# Patient Record
Sex: Male | Born: 1960
Health system: Southern US, Community
[De-identification: ages and names within clinical notes are randomized; demographics above are authoritative.]

## PROBLEM LIST (undated history)

## (undated) ENCOUNTER — Emergency Department (HOSPITAL_COMMUNITY): Admission: EM | Discharge status: Absent without leave | Payer: Medicare HMO

## (undated) DIAGNOSIS — R7303 Prediabetes: Secondary | ICD-10-CM

## (undated) DIAGNOSIS — IMO0002 Reserved for concepts with insufficient information to code with codable children: Secondary | ICD-10-CM

## (undated) DIAGNOSIS — M549 Dorsalgia, unspecified: Secondary | ICD-10-CM

## (undated) DIAGNOSIS — J439 Emphysema, unspecified: Secondary | ICD-10-CM

## (undated) DIAGNOSIS — N400 Enlarged prostate without lower urinary tract symptoms: Secondary | ICD-10-CM

## (undated) DIAGNOSIS — M1990 Unspecified osteoarthritis, unspecified site: Secondary | ICD-10-CM

## (undated) DIAGNOSIS — J449 Chronic obstructive pulmonary disease, unspecified: Secondary | ICD-10-CM

## (undated) DIAGNOSIS — J45909 Unspecified asthma, uncomplicated: Secondary | ICD-10-CM

## (undated) DIAGNOSIS — F419 Anxiety disorder, unspecified: Secondary | ICD-10-CM

## (undated) HISTORY — PX: EYE SURGERY: SHX253

## (undated) HISTORY — PX: BACK SURGERY: SHX140

---

## 2000-09-25 ENCOUNTER — Emergency Department (HOSPITAL_COMMUNITY): Admission: EM | Admit: 2000-09-25 | Discharge: 2000-09-25 | Payer: Self-pay

## 2001-01-17 ENCOUNTER — Ambulatory Visit (HOSPITAL_COMMUNITY): Admission: RE | Admit: 2001-01-17 | Discharge: 2001-01-17 | Payer: Self-pay | Admitting: Neurosurgery

## 2002-02-19 ENCOUNTER — Encounter: Payer: Self-pay | Admitting: Emergency Medicine

## 2002-02-19 ENCOUNTER — Emergency Department (HOSPITAL_COMMUNITY): Admission: EM | Admit: 2002-02-19 | Discharge: 2002-02-19 | Payer: Self-pay | Admitting: *Deleted

## 2003-05-15 ENCOUNTER — Ambulatory Visit (HOSPITAL_COMMUNITY): Admission: RE | Admit: 2003-05-15 | Discharge: 2003-05-15 | Payer: Self-pay | Admitting: Family Medicine

## 2003-05-15 ENCOUNTER — Encounter: Payer: Self-pay | Admitting: Family Medicine

## 2003-05-24 ENCOUNTER — Emergency Department (HOSPITAL_COMMUNITY): Admission: EM | Admit: 2003-05-24 | Discharge: 2003-05-25 | Payer: Self-pay | Admitting: *Deleted

## 2003-12-26 ENCOUNTER — Ambulatory Visit (HOSPITAL_COMMUNITY): Admission: RE | Admit: 2003-12-26 | Discharge: 2003-12-26 | Payer: Self-pay | Admitting: Internal Medicine

## 2004-03-29 ENCOUNTER — Emergency Department (HOSPITAL_COMMUNITY): Admission: EM | Admit: 2004-03-29 | Discharge: 2004-03-29 | Payer: Self-pay | Admitting: *Deleted

## 2004-04-02 ENCOUNTER — Inpatient Hospital Stay (HOSPITAL_COMMUNITY): Admission: RE | Admit: 2004-04-02 | Discharge: 2004-04-02 | Payer: Self-pay | Admitting: Oral & Maxillofacial Surgery

## 2007-04-30 ENCOUNTER — Ambulatory Visit (HOSPITAL_COMMUNITY): Admission: RE | Admit: 2007-04-30 | Discharge: 2007-04-30 | Payer: Self-pay | Admitting: Family Medicine

## 2008-03-15 ENCOUNTER — Ambulatory Visit (HOSPITAL_COMMUNITY): Admission: RE | Admit: 2008-03-15 | Discharge: 2008-03-15 | Payer: Self-pay | Admitting: Internal Medicine

## 2009-01-25 ENCOUNTER — Ambulatory Visit (HOSPITAL_COMMUNITY): Admission: RE | Admit: 2009-01-25 | Discharge: 2009-01-25 | Payer: Self-pay | Admitting: Internal Medicine

## 2010-11-10 ENCOUNTER — Encounter: Payer: Self-pay | Admitting: Family Medicine

## 2011-01-30 ENCOUNTER — Other Ambulatory Visit (HOSPITAL_COMMUNITY): Payer: Self-pay | Admitting: Internal Medicine

## 2011-01-30 DIAGNOSIS — M549 Dorsalgia, unspecified: Secondary | ICD-10-CM

## 2011-01-30 DIAGNOSIS — G8929 Other chronic pain: Secondary | ICD-10-CM

## 2011-01-31 ENCOUNTER — Ambulatory Visit (HOSPITAL_COMMUNITY)
Admission: RE | Admit: 2011-01-31 | Discharge: 2011-01-31 | Disposition: A | Discharge status: Home / Self Care | Payer: Medicare Other | Source: Ambulatory Visit | Attending: Internal Medicine | Admitting: Internal Medicine

## 2011-01-31 ENCOUNTER — Other Ambulatory Visit (HOSPITAL_COMMUNITY): Payer: Self-pay | Admitting: Internal Medicine

## 2011-01-31 DIAGNOSIS — M549 Dorsalgia, unspecified: Secondary | ICD-10-CM

## 2011-01-31 DIAGNOSIS — N433 Hydrocele, unspecified: Secondary | ICD-10-CM | POA: Insufficient documentation

## 2011-01-31 DIAGNOSIS — G8929 Other chronic pain: Secondary | ICD-10-CM

## 2011-01-31 DIAGNOSIS — N509 Disorder of male genital organs, unspecified: Secondary | ICD-10-CM | POA: Insufficient documentation
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
## 2012-08-19 ENCOUNTER — Other Ambulatory Visit (HOSPITAL_COMMUNITY): Payer: Self-pay | Admitting: Physical Medicine and Rehabilitation

## 2012-08-19 DIAGNOSIS — M961 Postlaminectomy syndrome, not elsewhere classified: Secondary | ICD-10-CM

## 2012-08-19 DIAGNOSIS — M545 Low back pain: Secondary | ICD-10-CM

## 2012-08-19 DIAGNOSIS — IMO0002 Reserved for concepts with insufficient information to code with codable children: Secondary | ICD-10-CM

## 2012-08-19 DIAGNOSIS — G894 Chronic pain syndrome: Secondary | ICD-10-CM

## 2012-08-20 ENCOUNTER — Ambulatory Visit (HOSPITAL_COMMUNITY)
Admission: RE | Admit: 2012-08-20 | Discharge: 2012-08-20 | Disposition: A | Discharge status: Home / Self Care | Payer: Medicare Other | Source: Ambulatory Visit | Attending: Physical Medicine and Rehabilitation | Admitting: Physical Medicine and Rehabilitation

## 2012-08-20 DIAGNOSIS — M545 Low back pain, unspecified: Secondary | ICD-10-CM | POA: Insufficient documentation

## 2012-08-20 DIAGNOSIS — IMO0002 Reserved for concepts with insufficient information to code with codable children: Secondary | ICD-10-CM

## 2012-08-20 DIAGNOSIS — M961 Postlaminectomy syndrome, not elsewhere classified: Secondary | ICD-10-CM

## 2012-08-20 DIAGNOSIS — M48061 Spinal stenosis, lumbar region without neurogenic claudication: Secondary | ICD-10-CM | POA: Insufficient documentation

## 2012-08-20 DIAGNOSIS — G894 Chronic pain syndrome: Secondary | ICD-10-CM

## 2012-08-20 DIAGNOSIS — G8929 Other chronic pain: Secondary | ICD-10-CM | POA: Insufficient documentation

## 2012-08-20 MED ORDER — GADOBENATE DIMEGLUMINE 529 MG/ML IV SOLN: 15.0000 mL | Freq: Once | INTRAVENOUS | Status: AC | PRN

## 2013-05-29 ENCOUNTER — Emergency Department (HOSPITAL_COMMUNITY)
Admission: EM | Admit: 2013-05-29 | Discharge: 2013-05-29 | Disposition: A | Discharge status: Home / Self Care | Payer: Medicare Other | Attending: Emergency Medicine | Admitting: Emergency Medicine

## 2013-05-29 ENCOUNTER — Encounter (HOSPITAL_COMMUNITY): Payer: Self-pay | Admitting: Emergency Medicine

## 2013-05-29 DIAGNOSIS — M25461 Effusion, right knee: Secondary | ICD-10-CM

## 2013-05-29 DIAGNOSIS — M25551 Pain in right hip: Secondary | ICD-10-CM

## 2013-05-29 DIAGNOSIS — M25559 Pain in unspecified hip: Secondary | ICD-10-CM | POA: Insufficient documentation

## 2013-05-29 DIAGNOSIS — M549 Dorsalgia, unspecified: Secondary | ICD-10-CM | POA: Insufficient documentation

## 2013-05-29 DIAGNOSIS — G8929 Other chronic pain: Secondary | ICD-10-CM | POA: Insufficient documentation

## 2013-05-29 DIAGNOSIS — Z791 Long term (current) use of non-steroidal anti-inflammatories (NSAID): Secondary | ICD-10-CM | POA: Insufficient documentation

## 2013-05-29 DIAGNOSIS — M25469 Effusion, unspecified knee: Secondary | ICD-10-CM | POA: Insufficient documentation

## 2013-05-29 DIAGNOSIS — Z79899 Other long term (current) drug therapy: Secondary | ICD-10-CM | POA: Insufficient documentation

## 2013-05-29 MED ORDER — ONDANSETRON HCL 4 MG PO TABS
4.0000 mg | ORAL_TABLET | Freq: Once | ORAL | Status: AC
  Administered 2013-05-29: 4 mg via ORAL
  Filled 2013-05-29: qty 1

## 2013-05-29 MED ORDER — KETOROLAC TROMETHAMINE 60 MG/2ML IM SOLN
60.0000 mg | Freq: Once | INTRAMUSCULAR | Status: AC
  Administered 2013-05-29: 60 mg via INTRAMUSCULAR
  Filled 2013-05-29: qty 2

## 2013-05-29 MED ORDER — MORPHINE SULFATE 4 MG/ML IJ SOLN
8.0000 mg | Freq: Once | INTRAMUSCULAR | Status: AC
  Administered 2013-05-29: 8 mg via INTRAMUSCULAR
  Filled 2013-05-29: qty 2

## 2013-05-29 NOTE — ED Provider Notes (Signed)
CSN: [id number]     Arrival date & time 05/29/13  2008 History     First MD Initiated Contact with Patient 05/29/13 2046     Chief Complaint  Patient presents with  . Knee Pain   (Consider location/radiation/quality/duration/timing/severity/associated sxs/prior Treatment) Patient is a 52 y.o. male presenting with knee pain. The history is provided by the patient.  Knee Pain Location:  Knee Time since incident:  2 days (Pt has chronic knee and hip pain, worse for the past 2 days) Injury: no   Knee location:  R knee Pain details:    Quality:  Aching   Radiates to: right hip.   Severity:  Severe   Onset quality:  Gradual   Timing:  Intermittent   Progression:  Worsening Chronicity:  Chronic Dislocation: no   Foreign body present:  No foreign bodies Prior injury to area:  No Relieved by:  Nothing Worsened by:  Bearing weight Ineffective treatments: narcotic pain medications. Associated symptoms: back pain and decreased ROM   Associated symptoms: no fever and no neck pain     History reviewed. No pertinent past medical history. History reviewed. No pertinent past surgical history. History reviewed. No pertinent family history. History  Substance Use Topics  . Smoking status: Not on file  . Smokeless tobacco: Not on file  . Alcohol Use: Not on file    Review of Systems  Constitutional: Negative for fever and activity change.       All ROS Neg except as noted in HPI  HENT: Negative for nosebleeds and neck pain.   Eyes: Negative for photophobia and discharge.  Respiratory: Negative for cough, shortness of breath and wheezing.   Cardiovascular: Negative for chest pain and palpitations.  Gastrointestinal: Negative for abdominal pain and blood in stool.  Genitourinary: Negative for dysuria, frequency and hematuria.  Musculoskeletal: Positive for back pain and arthralgias.  Skin: Negative.   Neurological: Negative for dizziness, seizures and speech difficulty.   Psychiatric/Behavioral: Negative for hallucinations and confusion.    Allergies  Review of patient's allergies indicates no known allergies.  Home Medications   Current Outpatient Rx  Name  Route  Sig  Dispense  Refill  . carisoprodol (SOMA) 350 MG tablet   Oral   Take 350 mg by mouth 4 (four) times daily as needed for muscle spasms.         . celecoxib (CELEBREX) 200 MG capsule   Oral   Take 200 mg by mouth daily.         . diazepam (VALIUM) 10 MG tablet   Oral   Take 10 mg by mouth 4 (four) times daily as needed for anxiety.         [name] doxepin (SINEQUAN) 100 MG capsule   Oral   Take 100-200 mg by mouth at bedtime as needed. FOR SLEEP         . HYDROcodone-acetaminophen (NORCO) 10-325 MG per tablet   Oral   Take 1 tablet by mouth every 6 (six) hours as needed for pain.         . pregabalin (LYRICA) 200 MG capsule   Oral   Take 200 mg by mouth 3 (three) times daily.          BP 120/83  Pulse 87  Temp(Src) 97.8 F (36.6 C) (Oral)  Resp 18  SpO2 97% Physical Exam  Nursing note and vitals reviewed. Constitutional: He is oriented to person, place, and time. He appears well-developed and well-nourished.  Non-toxic appearance.  HENT:  Head: Normocephalic.  Right Ear: Tympanic membrane and external ear normal.  Left Ear: Tympanic membrane and external ear normal.  Eyes: EOM and lids are normal. Pupils are equal, round, and reactive to light.  Neck: Normal range of motion. Neck supple. Carotid bruit is not present.  Cardiovascular: Normal rate, regular rhythm, normal heart sounds, intact distal pulses and normal pulses.   Pulmonary/Chest: Breath sounds normal. No respiratory distress.  Abdominal: Soft. Bowel sounds are normal. There is no tenderness. There is no guarding.  Musculoskeletal: Normal range of motion.  There is crepitus of the right and left knee, right more than left. There is an effusion of the right knee. Is no posterior mass appreciated. The  Achilles tendon is intact on the right. The dorsalis pedis and posterior tibial pulses are symmetrical. There is pain and soreness to attempted range of motion of the right hip.  Lymphadenopathy:       Head (right side): No submandibular adenopathy present.       Head (left side): No submandibular adenopathy present.    He has no cervical adenopathy.  Neurological: He is alert and oriented to person, place, and time. He has normal strength. No cranial nerve deficit or sensory deficit.  Skin: Skin is warm and dry.  Psychiatric: He has a normal mood and affect. His speech is normal.    ED Course   Procedures (including critical care time)  Labs Reviewed - No data to display No results found. No diagnosis found.  MDM  **I have reviewed nursing notes, vital signs, and all appropriate lab and imaging results for this patient.* Patient states he has" arthritis all over his body" . He denies any recent fall, accident, or injury involving the right knee. He states that he has severe back problems. He is recently been noticing increasing pain of the right hip, pain and swelling of the right knee.  The patient has crepitus of the right and left knee. Right more than left. There is an effusion noted of the right knee. There is pain with attempted range of motion of the right hip.  The patient is given an injection of morphine and an injection of Toradol here in the emergency department. The patient is currently being treated with multiple medications for her discomfort, and he is advised to see his primary physician or pain physician for additional assistance with this problem. Patient is referred to orthopedics for additional evaluation including possible aspiration of the right knee. Crutches were offered to the patient, however he states he has a cane and uses it from time to time.  Kathie Dike, PA-C 05/29/13 2126

## 2013-05-29 NOTE — ED Notes (Signed)
Pt alert & oriented x4, stable gait. Patient given discharge instructions, paperwork & prescription(s). Patient  instructed to stop at the registration desk to finish any additional paperwork. Patient verbalized understanding. Pt left department w/ no further questions. 

## 2013-05-29 NOTE — ED Notes (Signed)
Pt c/o right knee pain and swelling since Friday evening. Pt states pain radiates to his hip.

## 2013-05-29 NOTE — ED Notes (Signed)
Pt reports right hip pain & right knee swelling. Pt denies any injury. Pain started on Friday.

## 2013-06-03 NOTE — ED Provider Notes (Signed)
History/physical exam/procedure(s) were performed by non-physician practitioner and as supervising physician I was immediately available for consultation/collaboration. I have reviewed all notes and am in agreement with care and plan.   Kahle Mcqueen S Tranae Laramie, MD 06/03/13 1244 

## 2013-06-07 ENCOUNTER — Ambulatory Visit (INDEPENDENT_AMBULATORY_CARE_PROVIDER_SITE_OTHER): Payer: Medicare Other

## 2013-06-07 ENCOUNTER — Ambulatory Visit (INDEPENDENT_AMBULATORY_CARE_PROVIDER_SITE_OTHER): Payer: Medicare Other | Admitting: Orthopedic Surgery

## 2013-06-07 VITALS — BP 120/76 | Ht 73.0 in | Wt 177.0 lb

## 2013-06-07 DIAGNOSIS — M25561 Pain in right knee: Secondary | ICD-10-CM

## 2013-06-07 DIAGNOSIS — M25569 Pain in unspecified knee: Secondary | ICD-10-CM

## 2013-06-07 NOTE — Patient Instructions (Addendum)
NO FOLLOW UP NEEDED  SEE a neurosurgeon AND PRIMARY CARE PHYSICIAN

## 2013-06-07 NOTE — Progress Notes (Signed)
Patient ID: Wayne Rowe, male   DOB: 01-13-1961, 52 y.o.   MRN: [id number] Chief Complaint  Patient presents with  . Knee Pain    Follow up ER right knee effusion, pain in  right hip and right leg    52 year old male with lumbar disc surgery many many years ago, treated by infection presents with right leg pain and effusion of the right knee treated with Celebrex with good relief. Still has right leg and hip pain. Effusion has almost resolved. Complains of chronic pain catching locking of the right hip hand right leg involving the right knee. He had an x-ray today which shows a mild arthritis.  He appears to be a patient with chronic pain his pain is at a 10 and constant he describes sharp throbbing pain  Review of systems weight gain and fever with chills and fatigue watering of the shortness of breath wheezing and cough nausea, vomiting, frequency with urgency unsteady gait anxiety excessive thirst and urination and seasonal allergies the other systems were negative   The past, family history and social history have been reviewed and are recorded in the corresponding sections of epic   BP 120/76  Ht 6\' 1"  (1.854 m)  Wt 177 lb (80.287 kg)  BMI 23.36 kg/m2 Overall he is small to thin in his frame he is oriented x3 his mood is depressed his affect is flat he seems to be on some type of pain medication he walks with a cane he has a limp  His right knee does have a small effusion there is no redness he complains of diffuse tenderness to palpation his flexion ARC is 125 his knee is stable his motor exam shows no atrophy his skin is intact without rash she has a good pulse he has no lymphadenopathy  His x-ray again shows osteopenia mild arthritis  He doesn't have enough fluid to draw any fluid off of his knee but I did give him an injection he should see his primary care doctor for referral to a pain management clinic or a neurosurgeon to address his radicular symptoms no followup  needed  Knee  Injection Procedure Note  Pre-operative Diagnosis: right knee oa  Post-operative Diagnosis: same  Indications: pain  Anesthesia: ethyl chloride   Procedure Details   Verbal consent was obtained for the procedure. Time out was completed.The joint was prepped with alcohol, followed by  Ethyl chloride spray and A 20 gauge needle was inserted into the knee via lateral approach; 4ml 1% lidocaine and 1 ml of depomedrol  was then injected into the joint . The needle was removed and the area cleansed and dressed.  Complications:  None; patient tolerated the procedure well.

## 2013-06-08 ENCOUNTER — Telehealth: Payer: Self-pay | Admitting: *Deleted

## 2013-06-08 NOTE — Telephone Encounter (Signed)
I spoke with [facility] and advised that patient needed to see a nerosurgeon, per Dr. Mort Sawyers recommendation. The patient has [facility] Medicaid [place] Access, therefore they will make the referral.

## 2013-06-16 ENCOUNTER — Telehealth: Payer: Self-pay | Admitting: Orthopedic Surgery

## 2013-06-16 NOTE — Telephone Encounter (Signed)
Spoke with patient and advised him that he would have to call [facility]. They will have to make the referral due to patient having [place] Medicaid [place].

## 2013-06-19 ENCOUNTER — Encounter (HOSPITAL_COMMUNITY): Payer: Self-pay | Admitting: Emergency Medicine

## 2013-06-19 ENCOUNTER — Emergency Department (HOSPITAL_COMMUNITY): Payer: Medicare Other

## 2013-06-19 ENCOUNTER — Emergency Department (HOSPITAL_COMMUNITY)
Admission: EM | Admit: 2013-06-19 | Discharge: 2013-06-20 | Disposition: A | Discharge status: Home / Self Care | Payer: Medicare Other | Attending: Emergency Medicine | Admitting: Emergency Medicine

## 2013-06-19 DIAGNOSIS — Z8739 Personal history of other diseases of the musculoskeletal system and connective tissue: Secondary | ICD-10-CM | POA: Insufficient documentation

## 2013-06-19 DIAGNOSIS — F17200 Nicotine dependence, unspecified, uncomplicated: Secondary | ICD-10-CM | POA: Insufficient documentation

## 2013-06-19 DIAGNOSIS — Y939 Activity, unspecified: Secondary | ICD-10-CM | POA: Insufficient documentation

## 2013-06-19 DIAGNOSIS — S0993XA Unspecified injury of face, initial encounter: Secondary | ICD-10-CM | POA: Insufficient documentation

## 2013-06-19 DIAGNOSIS — M542 Cervicalgia: Secondary | ICD-10-CM

## 2013-06-19 DIAGNOSIS — R296 Repeated falls: Secondary | ICD-10-CM | POA: Insufficient documentation

## 2013-06-19 DIAGNOSIS — R0781 Pleurodynia: Secondary | ICD-10-CM

## 2013-06-19 DIAGNOSIS — S298XXA Other specified injuries of thorax, initial encounter: Secondary | ICD-10-CM | POA: Insufficient documentation

## 2013-06-19 DIAGNOSIS — Y929 Unspecified place or not applicable: Secondary | ICD-10-CM | POA: Insufficient documentation

## 2013-06-19 DIAGNOSIS — Z79899 Other long term (current) drug therapy: Secondary | ICD-10-CM | POA: Insufficient documentation

## 2013-06-19 HISTORY — DX: Unspecified osteoarthritis, unspecified site: M19.90
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
## 2013-06-19 NOTE — ED Provider Notes (Signed)
Pt left at change of shift to get results of C spine CT scan.  Pt discharged, he already has pain medication per Dr Adriana Simas.   Ct Cervical Spine Wo Contrast  06/19/2013   *RADIOLOGY REPORT*  Clinical Data: Neck and back pain.  CT CERVICAL SPINE WITHOUT CONTRAST  Technique:  Multidetector CT imaging of the cervical spine was performed. Multiplanar CT image reconstructions were also generated.  Comparison: Radiographs of the cervical spine 06/19/2013 CT cervical spine and 03/29/2004  Findings: Cervical spine is aligned from the skull base through the T1 vertebral body.  There is disc space narrowing, anterior osteophyte formation, and posterior spurring at C4-5, C5-6, and C6- 7.  Facet joint hypertrophy and uncovertebral spurring causes mild bilateral neural foraminal narrowing at C3-C4.  Uncovertebral spurring causes mild bilateral neural foramen and narrowing at C4-C5.  Uncovertebral spurring bilaterally causes moderate right and mild left neural foraminal narrowing at C5-C6.  Uncovertebral spurring causes moderate left and mild right neural foraminal narrowing at C6-C7.  Negative for acute cervical spine fracture.  No acute findings are seen within the cervical spinal canal.  Negative for prevertebral edema.  Prevertebral soft tissues are within normal limits.  IMPRESSION:  1.  Negative for acute bony injury to the cervical spine. 2.  Multilevel degenerative disc disease.   Original Report Authenticated By: Britta Mccreedy, M.D.    Ward Givens, MD 06/19/13 2342
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
## 2013-06-19 NOTE — ED Notes (Signed)
MD at bedside. 

## 2013-09-27 ENCOUNTER — Ambulatory Visit (HOSPITAL_COMMUNITY)
Admission: RE | Admit: 2013-09-27 | Discharge: 2013-09-27 | Disposition: A | Discharge status: Home / Self Care | Payer: Medicare Other | Source: Ambulatory Visit | Attending: Internal Medicine | Admitting: Internal Medicine

## 2013-09-27 ENCOUNTER — Other Ambulatory Visit (HOSPITAL_COMMUNITY): Payer: Self-pay | Admitting: Internal Medicine

## 2013-09-27 DIAGNOSIS — R05 Cough: Secondary | ICD-10-CM | POA: Insufficient documentation

## 2013-09-27 DIAGNOSIS — R059 Cough, unspecified: Secondary | ICD-10-CM | POA: Insufficient documentation

## 2013-10-19 ENCOUNTER — Telehealth: Payer: Self-pay

## 2013-10-19 NOTE — Telephone Encounter (Signed)
Pt was referred by Dr. Sherwood Gambler for screening colonoscopy. I called pt and he is not ready to schedule. York Spaniel he will call when ready.

## 2013-11-06 ENCOUNTER — Emergency Department (HOSPITAL_COMMUNITY)
Admission: EM | Admit: 2013-11-06 | Discharge: 2013-11-06 | Disposition: A | Discharge status: Home / Self Care | Payer: Medicare Other | Attending: Emergency Medicine | Admitting: Emergency Medicine

## 2013-11-06 ENCOUNTER — Encounter (HOSPITAL_COMMUNITY): Payer: Self-pay | Admitting: Emergency Medicine

## 2013-11-06 DIAGNOSIS — M129 Arthropathy, unspecified: Secondary | ICD-10-CM | POA: Insufficient documentation

## 2013-11-06 DIAGNOSIS — Z791 Long term (current) use of non-steroidal anti-inflammatories (NSAID): Secondary | ICD-10-CM | POA: Insufficient documentation

## 2013-11-06 DIAGNOSIS — T24219A Burn of second degree of unspecified thigh, initial encounter: Secondary | ICD-10-CM | POA: Insufficient documentation

## 2013-11-06 DIAGNOSIS — T22219A Burn of second degree of unspecified forearm, initial encounter: Secondary | ICD-10-CM | POA: Insufficient documentation

## 2013-11-06 DIAGNOSIS — R296 Repeated falls: Secondary | ICD-10-CM | POA: Insufficient documentation

## 2013-11-06 DIAGNOSIS — X19XXXA Contact with other heat and hot substances, initial encounter: Secondary | ICD-10-CM | POA: Insufficient documentation

## 2013-11-06 DIAGNOSIS — T2122XA Burn of second degree of abdominal wall, initial encounter: Secondary | ICD-10-CM | POA: Insufficient documentation

## 2013-11-06 DIAGNOSIS — T22259A Burn of second degree of unspecified shoulder, initial encounter: Secondary | ICD-10-CM | POA: Insufficient documentation

## 2013-11-06 DIAGNOSIS — R509 Fever, unspecified: Secondary | ICD-10-CM | POA: Insufficient documentation

## 2013-11-06 DIAGNOSIS — F411 Generalized anxiety disorder: Secondary | ICD-10-CM | POA: Insufficient documentation

## 2013-11-06 DIAGNOSIS — F17200 Nicotine dependence, unspecified, uncomplicated: Secondary | ICD-10-CM | POA: Insufficient documentation

## 2013-11-06 DIAGNOSIS — Z79899 Other long term (current) drug therapy: Secondary | ICD-10-CM | POA: Insufficient documentation

## 2013-11-06 DIAGNOSIS — Y929 Unspecified place or not applicable: Secondary | ICD-10-CM | POA: Insufficient documentation

## 2013-11-06 DIAGNOSIS — Y9389 Activity, other specified: Secondary | ICD-10-CM | POA: Insufficient documentation

## 2013-11-06 DIAGNOSIS — T2102XA Burn of unspecified degree of abdominal wall, initial encounter: Secondary | ICD-10-CM

## 2013-11-06 HISTORY — DX: Reserved for concepts with insufficient information to code with codable children: IMO0002

## 2013-11-06 HISTORY — DX: Anxiety disorder, unspecified: F41.9

## 2013-11-06 MED ORDER — SILVER SULFADIAZINE 1 % EX CREA
TOPICAL_CREAM | Freq: Once | CUTANEOUS | Status: AC
  Administered 2013-11-06: 1 via TOPICAL
  Filled 2013-11-06: qty 50

## 2013-11-06 MED ORDER — HYDROMORPHONE HCL PF 1 MG/ML IJ SOLN
INTRAMUSCULAR | Status: AC
  Administered 2013-11-06: 1 mg via INTRAVENOUS
  Filled 2013-11-06: qty 1

## 2013-11-06 MED ORDER — HYDROMORPHONE HCL PF 1 MG/ML IJ SOLN
1.0000 mg | Freq: Once | INTRAMUSCULAR | Status: AC
  Administered 2013-11-06: 1 mg via INTRAVENOUS

## 2013-11-06 MED ORDER — SODIUM CHLORIDE 0.9 % IV BOLUS (SEPSIS)
1000.0000 mL | Freq: Once | INTRAVENOUS | Status: AC
  Administered 2013-11-06: 1000 mL via INTRAVENOUS

## 2013-11-06 MED ORDER — ONDANSETRON HCL 4 MG/2ML IJ SOLN
INTRAMUSCULAR | Status: AC
  Administered 2013-11-06: 08:00:00 4 mg via INTRAVENOUS
  Filled 2013-11-06: qty 2

## 2013-11-06 MED ORDER — ONDANSETRON HCL 4 MG/2ML IJ SOLN
4.0000 mg | Freq: Once | INTRAMUSCULAR | Status: AC
  Administered 2013-11-06: 4 mg via INTRAVENOUS

## 2013-11-06 MED ORDER — OXYCODONE-ACETAMINOPHEN 5-325 MG PO TABS: 2.0000 | ORAL_TABLET | ORAL | Status: DC | PRN

## 2013-11-06 MED ORDER — HYDROMORPHONE HCL PF 1 MG/ML IJ SOLN
INTRAMUSCULAR | Status: AC
  Filled 2013-11-06: qty 1

## 2013-11-06 MED ORDER — SILVER SULFADIAZINE 1 % EX CREA
TOPICAL_CREAM | CUTANEOUS | Status: AC
  Filled 2013-11-06: qty 50

## 2013-11-06 NOTE — ED Notes (Signed)
EDP aware of pt temperature. EDP at bedside talking with pt. No new orders given at this time. No new signs/symptoms noted. nad noted.
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
## 2013-11-06 NOTE — ED Notes (Addendum)
EDP aware of pt v/s. Pt skin does not feel cool, no s/s of cyanosis or trembling. Pt reports feels cold but nad noted. EDP give verbal order to admin warm fluids, give pt warm blankets, and re-assess pt temperature. Pt given two warm blankets.
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
## 2013-11-08 ENCOUNTER — Encounter (HOSPITAL_COMMUNITY): Payer: Self-pay | Admitting: Emergency Medicine

## 2013-11-08 ENCOUNTER — Emergency Department (HOSPITAL_COMMUNITY)
Admission: EM | Admit: 2013-11-08 | Discharge: 2013-11-09 | Disposition: A | Discharge status: Home / Self Care | Payer: Medicare Other | Attending: Emergency Medicine | Admitting: Emergency Medicine

## 2013-11-08 DIAGNOSIS — Z791 Long term (current) use of non-steroidal anti-inflammatories (NSAID): Secondary | ICD-10-CM | POA: Insufficient documentation

## 2013-11-08 DIAGNOSIS — M129 Arthropathy, unspecified: Secondary | ICD-10-CM | POA: Insufficient documentation

## 2013-11-08 DIAGNOSIS — Z79899 Other long term (current) drug therapy: Secondary | ICD-10-CM | POA: Insufficient documentation

## 2013-11-08 DIAGNOSIS — F17200 Nicotine dependence, unspecified, uncomplicated: Secondary | ICD-10-CM | POA: Insufficient documentation

## 2013-11-08 DIAGNOSIS — Z4800 Encounter for change or removal of nonsurgical wound dressing: Secondary | ICD-10-CM | POA: Insufficient documentation

## 2013-11-08 DIAGNOSIS — IMO0002 Reserved for concepts with insufficient information to code with codable children: Secondary | ICD-10-CM | POA: Insufficient documentation

## 2013-11-08 DIAGNOSIS — F411 Generalized anxiety disorder: Secondary | ICD-10-CM | POA: Insufficient documentation

## 2013-11-08 DIAGNOSIS — Z5189 Encounter for other specified aftercare: Secondary | ICD-10-CM

## 2013-11-08 MED ORDER — SILVER SULFADIAZINE 1 % EX CREA
TOPICAL_CREAM | CUTANEOUS | Status: AC
  Administered 2013-11-09
  Filled 2013-11-08: qty 50

## 2013-11-08 MED ORDER — SILVER SULFADIAZINE 1 % EX CREA: 1.0000 "application " | TOPICAL_CREAM | Freq: Every day | CUTANEOUS | Status: DC

## 2013-11-08 MED ORDER — HYDROMORPHONE HCL PF 1 MG/ML IJ SOLN
1.0000 mg | Freq: Once | INTRAMUSCULAR | Status: AC
  Administered 2013-11-08: 1 mg via INTRAMUSCULAR
  Filled 2013-11-08: qty 1

## 2013-11-08 MED ORDER — HYDROCODONE-ACETAMINOPHEN 5-325 MG PO TABS: 1.0000 | ORAL_TABLET | Freq: Four times a day (QID) | ORAL | Status: DC | PRN

## 2013-11-08 NOTE — Discharge Instructions (Signed)
Follow up with your md in 2-3 days for recheck °
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
## 2013-11-09 MED ORDER — HYDROMORPHONE HCL PF 1 MG/ML IJ SOLN
INTRAMUSCULAR | Status: AC
  Administered 2013-11-09: via INTRAMUSCULAR
  Filled 2013-11-09: qty 1

## 2013-11-09 NOTE — ED Notes (Signed)
Extensive dressing applied to abdomen and both arms and upper thighs and left flank and hands area with silvadene and clean gauze and abdominal pads. Patient tolerated well.

## 2013-11-10 ENCOUNTER — Encounter (HOSPITAL_COMMUNITY): Payer: Self-pay | Admitting: Emergency Medicine

## 2013-11-10 ENCOUNTER — Emergency Department (HOSPITAL_COMMUNITY)
Admission: EM | Admit: 2013-11-10 | Discharge: 2013-11-11 | Disposition: A | Discharge status: Home / Self Care | Payer: Medicare Other | Attending: Emergency Medicine | Admitting: Emergency Medicine

## 2013-11-10 DIAGNOSIS — Z791 Long term (current) use of non-steroidal anti-inflammatories (NSAID): Secondary | ICD-10-CM | POA: Insufficient documentation

## 2013-11-10 DIAGNOSIS — X19XXXA Contact with other heat and hot substances, initial encounter: Secondary | ICD-10-CM | POA: Insufficient documentation

## 2013-11-10 DIAGNOSIS — M129 Arthropathy, unspecified: Secondary | ICD-10-CM | POA: Insufficient documentation

## 2013-11-10 DIAGNOSIS — Y939 Activity, unspecified: Secondary | ICD-10-CM | POA: Insufficient documentation

## 2013-11-10 DIAGNOSIS — T22219A Burn of second degree of unspecified forearm, initial encounter: Secondary | ICD-10-CM | POA: Insufficient documentation

## 2013-11-10 DIAGNOSIS — F411 Generalized anxiety disorder: Secondary | ICD-10-CM | POA: Insufficient documentation

## 2013-11-10 DIAGNOSIS — T2122XA Burn of second degree of abdominal wall, initial encounter: Secondary | ICD-10-CM | POA: Insufficient documentation

## 2013-11-10 DIAGNOSIS — Y929 Unspecified place or not applicable: Secondary | ICD-10-CM | POA: Insufficient documentation

## 2013-11-10 DIAGNOSIS — F17200 Nicotine dependence, unspecified, uncomplicated: Secondary | ICD-10-CM | POA: Insufficient documentation

## 2013-11-10 DIAGNOSIS — T24219A Burn of second degree of unspecified thigh, initial encounter: Secondary | ICD-10-CM | POA: Insufficient documentation

## 2013-11-10 DIAGNOSIS — Z79899 Other long term (current) drug therapy: Secondary | ICD-10-CM | POA: Insufficient documentation

## 2013-11-10 DIAGNOSIS — T310 Burns involving less than 10% of body surface: Secondary | ICD-10-CM

## 2013-11-10 MED ORDER — OXYCODONE-ACETAMINOPHEN 5-325 MG PO TABS
2.0000 | ORAL_TABLET | Freq: Once | ORAL | Status: AC
  Administered 2013-11-10: 2 via ORAL
  Filled 2013-11-10: qty 2
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
## 2013-11-10 NOTE — ED Notes (Signed)
MD at bedside. 

## 2013-12-08 ENCOUNTER — Emergency Department (HOSPITAL_COMMUNITY): Payer: No Typology Code available for payment source

## 2013-12-08 ENCOUNTER — Encounter (HOSPITAL_COMMUNITY): Payer: Self-pay | Admitting: Emergency Medicine

## 2013-12-08 ENCOUNTER — Emergency Department (HOSPITAL_COMMUNITY)
Admission: EM | Admit: 2013-12-08 | Discharge: 2013-12-08 | Disposition: A | Discharge status: Home / Self Care | Payer: No Typology Code available for payment source | Attending: Emergency Medicine | Admitting: Emergency Medicine

## 2013-12-08 DIAGNOSIS — Y92410 Unspecified street and highway as the place of occurrence of the external cause: Secondary | ICD-10-CM | POA: Diagnosis not present

## 2013-12-08 DIAGNOSIS — S139XXA Sprain of joints and ligaments of unspecified parts of neck, initial encounter: Secondary | ICD-10-CM | POA: Diagnosis not present

## 2013-12-08 DIAGNOSIS — Z791 Long term (current) use of non-steroidal anti-inflammatories (NSAID): Secondary | ICD-10-CM | POA: Insufficient documentation

## 2013-12-08 DIAGNOSIS — IMO0002 Reserved for concepts with insufficient information to code with codable children: Secondary | ICD-10-CM | POA: Diagnosis not present

## 2013-12-08 DIAGNOSIS — Y9389 Activity, other specified: Secondary | ICD-10-CM | POA: Insufficient documentation

## 2013-12-08 DIAGNOSIS — F411 Generalized anxiety disorder: Secondary | ICD-10-CM | POA: Diagnosis not present

## 2013-12-08 DIAGNOSIS — M129 Arthropathy, unspecified: Secondary | ICD-10-CM | POA: Insufficient documentation

## 2013-12-08 DIAGNOSIS — M5412 Radiculopathy, cervical region: Secondary | ICD-10-CM

## 2013-12-08 DIAGNOSIS — F17200 Nicotine dependence, unspecified, uncomplicated: Secondary | ICD-10-CM | POA: Diagnosis not present

## 2013-12-08 DIAGNOSIS — Z79899 Other long term (current) drug therapy: Secondary | ICD-10-CM | POA: Diagnosis not present

## 2013-12-08 DIAGNOSIS — S46909A Unspecified injury of unspecified muscle, fascia and tendon at shoulder and upper arm level, unspecified arm, initial encounter: Secondary | ICD-10-CM | POA: Insufficient documentation

## 2013-12-08 DIAGNOSIS — Z792 Long term (current) use of antibiotics: Secondary | ICD-10-CM | POA: Insufficient documentation

## 2013-12-08 DIAGNOSIS — S4980XA Other specified injuries of shoulder and upper arm, unspecified arm, initial encounter: Secondary | ICD-10-CM | POA: Diagnosis not present

## 2013-12-08 DIAGNOSIS — S161XXA Strain of muscle, fascia and tendon at neck level, initial encounter: Secondary | ICD-10-CM

## 2013-12-08 DIAGNOSIS — S0993XA Unspecified injury of face, initial encounter: Secondary | ICD-10-CM | POA: Diagnosis present

## 2013-12-08 MED ORDER — PREDNISONE 50 MG PO TABS
60.0000 mg | ORAL_TABLET | Freq: Once | ORAL | Status: AC
  Administered 2013-12-08: 60 mg via ORAL
  Filled 2013-12-08 (×2): qty 1

## 2013-12-08 MED ORDER — PREDNISONE 20 MG PO TABS: 60.0000 mg | ORAL_TABLET | Freq: Every day | ORAL | Status: DC

## 2013-12-08 NOTE — Telephone Encounter (Signed)
Letter to PCP
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
## 2013-12-08 NOTE — Discharge Instructions (Signed)
Cervical Sprain A cervical sprain is an injury in the neck in which the strong, fibrous tissues (ligaments) that connect your neck bones stretch or tear. Cervical sprains can range from mild to severe. Severe cervical sprains can cause the neck vertebrae to be unstable. This can lead to damage of the spinal cord and can result in serious nervous system problems. The amount of time it takes for a cervical sprain to get better depends on the cause and extent of the injury. Most cervical sprains heal in 1 to 3 weeks. CAUSES  Severe cervical sprains may be caused by:   Contact sport injuries (such as from football, rugby, wrestling, hockey, auto racing, gymnastics, diving, martial arts, or boxing).   Motor vehicle collisions.   Whiplash injuries. This is an injury from a sudden forward-and backward whipping movement of the head and neck.  Falls.  Mild cervical sprains may be caused by:   Being in an awkward position, such as while cradling a telephone between your ear and shoulder.   Sitting in a chair that does not offer proper support.   Working at a poorly designed computer station.   Looking up or down for long periods of time.  SYMPTOMS   Pain, soreness, stiffness, or a burning sensation in the front, back, or sides of the neck. This discomfort may develop immediately after the injury or slowly, 24 hours or more after the injury.   Pain or tenderness directly in the middle of the back of the neck.   Shoulder or upper back pain.   Limited ability to move the neck.   Headache.   Dizziness.   Weakness, numbness, or tingling in the hands or arms.   Muscle spasms.   Difficulty swallowing or chewing.   Tenderness and swelling of the neck.  DIAGNOSIS  Most of the time your health care provider can diagnose a cervical sprain by taking your history and doing a physical exam. Your health care provider will ask about previous neck injuries and any known neck  problems, such as arthritis in the neck. X-rays may be taken to find out if there are any other problems, such as with the bones of the neck. Other tests, such as a CT scan or MRI, may also be needed.  TREATMENT  Treatment depends on the severity of the cervical sprain. Mild sprains can be treated with rest, keeping the neck in place (immobilization), and pain medicines. Severe cervical sprains are immediately immobilized. Further treatment is done to help with pain, muscle spasms, and other symptoms and may include:  Medicines, such as pain relievers, numbing medicines, or muscle relaxants.   Physical therapy. This may involve stretching exercises, strengthening exercises, and posture training. Exercises and improved posture can help stabilize the neck, strengthen muscles, and help stop symptoms from returning.  HOME CARE INSTRUCTIONS   Put ice on the injured area.   Put ice in a plastic bag.   Place a towel between your skin and the bag.   Leave the ice on for 15 20 minutes, 3 4 times a day.   If your injury was severe, you may have been given a cervical collar to wear. A cervical collar is a two-piece collar designed to keep your neck from moving while it heals.  Do not remove the collar unless instructed by your health care provider.  If you have long hair, keep it outside of the collar.  Ask your health care provider before making any adjustments to your collar.   Minor adjustments may be required over time to improve comfort and reduce pressure on your chin or on the back of your head.  Ifyou are allowed to remove the collar for cleaning or bathing, follow your health care provider's instructions on how to do so safely.  Keep your collar clean by wiping it with mild soap and water and drying it completely. If the collar you have been given includes removable pads, remove them every 1 2 days and hand wash them with soap and water. Allow them to air dry. They should be completely  dry before you wear them in the collar.  If you are allowed to remove the collar for cleaning and bathing, wash and dry the skin of your neck. Check your skin for irritation or sores. If you see any, tell your health care provider.  Do not drive while wearing the collar.   Only take over-the-counter or prescription medicines for pain, discomfort, or fever as directed by your health care provider.   Keep all follow-up appointments as directed by your health care provider.   Keep all physical therapy appointments as directed by your health care provider.   Make any needed adjustments to your workstation to promote good posture.   Avoid positions and activities that make your symptoms worse.   Warm up and stretch before being active to help prevent problems.  SEEK MEDICAL CARE IF:   Your pain is not controlled with medicine.   You are unable to decrease your pain medicine over time as planned.   Your activity level is not improving as expected.  SEEK IMMEDIATE MEDICAL CARE IF:   You develop any bleeding.  You develop stomach upset.  You have signs of an allergic reaction to your medicine.   Your symptoms get worse.   You develop new, unexplained symptoms.   You have numbness, tingling, weakness, or paralysis in any part of your body.  MAKE SURE YOU:   Understand these instructions.  Will watch your condition.  Will get help right away if you are not doing well or get worse. Document Released: 08/03/2007 Document Revised: 07/27/2013 Document Reviewed: 04/13/2013 [employer] Patient Information 2014 [employer].  
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
## 2014-05-26 ENCOUNTER — Emergency Department (HOSPITAL_COMMUNITY)
Admission: EM | Admit: 2014-05-26 | Discharge: 2014-05-26 | Disposition: A | Discharge status: Home / Self Care | Payer: No Typology Code available for payment source | Attending: Emergency Medicine | Admitting: Emergency Medicine

## 2014-05-26 ENCOUNTER — Encounter (HOSPITAL_COMMUNITY): Payer: Self-pay | Admitting: Emergency Medicine

## 2014-05-26 DIAGNOSIS — Y92410 Unspecified street and highway as the place of occurrence of the external cause: Secondary | ICD-10-CM | POA: Diagnosis not present

## 2014-05-26 DIAGNOSIS — M542 Cervicalgia: Secondary | ICD-10-CM

## 2014-05-26 DIAGNOSIS — S79929A Unspecified injury of unspecified thigh, initial encounter: Secondary | ICD-10-CM

## 2014-05-26 DIAGNOSIS — Y9389 Activity, other specified: Secondary | ICD-10-CM | POA: Diagnosis not present

## 2014-05-26 DIAGNOSIS — Z791 Long term (current) use of non-steroidal anti-inflammatories (NSAID): Secondary | ICD-10-CM | POA: Diagnosis not present

## 2014-05-26 DIAGNOSIS — M129 Arthropathy, unspecified: Secondary | ICD-10-CM | POA: Diagnosis not present

## 2014-05-26 DIAGNOSIS — Z792 Long term (current) use of antibiotics: Secondary | ICD-10-CM | POA: Insufficient documentation

## 2014-05-26 DIAGNOSIS — Z9889 Other specified postprocedural states: Secondary | ICD-10-CM | POA: Insufficient documentation

## 2014-05-26 DIAGNOSIS — M545 Low back pain, unspecified: Secondary | ICD-10-CM

## 2014-05-26 DIAGNOSIS — IMO0002 Reserved for concepts with insufficient information to code with codable children: Secondary | ICD-10-CM | POA: Diagnosis not present

## 2014-05-26 DIAGNOSIS — F17200 Nicotine dependence, unspecified, uncomplicated: Secondary | ICD-10-CM | POA: Diagnosis not present

## 2014-05-26 DIAGNOSIS — S79919A Unspecified injury of unspecified hip, initial encounter: Secondary | ICD-10-CM | POA: Insufficient documentation

## 2014-05-26 DIAGNOSIS — S0993XA Unspecified injury of face, initial encounter: Secondary | ICD-10-CM | POA: Diagnosis present

## 2014-05-26 DIAGNOSIS — S199XXA Unspecified injury of neck, initial encounter: Principal | ICD-10-CM

## 2014-05-26 MED ORDER — NAPROXEN 500 MG PO TABS: 500.0000 mg | ORAL_TABLET | Freq: Two times a day (BID) | ORAL | Status: DC

## 2014-05-26 MED ORDER — CYCLOBENZAPRINE HCL 10 MG PO TABS
10.0000 mg | ORAL_TABLET | Freq: Once | ORAL | Status: AC
  Administered 2014-05-26: 10 mg via ORAL
  Filled 2014-05-26: qty 1

## 2014-05-26 MED ORDER — NAPROXEN 250 MG PO TABS
500.0000 mg | ORAL_TABLET | Freq: Once | ORAL | Status: AC
Start: 2014-05-26 — End: 2014-05-26
  Administered 2014-05-26: 500 mg via ORAL
  Filled 2014-05-26: qty 2

## 2014-05-26 MED ORDER — CYCLOBENZAPRINE HCL 10 MG PO TABS: 10.0000 mg | ORAL_TABLET | Freq: Three times a day (TID) | ORAL | Status: DC | PRN
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
## 2014-05-26 NOTE — Discharge Instructions (Signed)
Ice packs to the injured or sore muscles for the next several days then start using heat. Take the medications for pain and muscle spasms.  Recheck if you aren't improving in the next week by your doctor. You will need to see your doctor to get more of your chronic pain medications.   Chronic Pain Discharge Instructions  Emergency care providers appreciate that many patients coming to [place] are in severe pain and we wish to address their pain in the safest, most responsible manner.  It is important to recognize however, that the proper treatment of chronic pain differs from that of the pain of injuries and acute illnesses.  Our goal is to provide quality, safe, personalized care and we thank you for giving [place] the opportunity to serve you. The use of narcotics and related agents for chronic pain syndromes may lead to additional physical and psychological problems.  Nearly as many people die from prescription narcotics each year as die from car crashes.  Additionally, this risk is increased if such prescriptions are obtained from a variety of sources.  Therefore, only your primary care physician or a pain management specialist is able to safely treat such syndromes with narcotic medications long-term.    Documentation revealing such prescriptions have been sought from multiple sources may prohibit [place] from providing a refill or different narcotic medication.  Your name may be checked first through the [facility] Controlled Substances Reporting System.  This database is a record of controlled substance medication prescriptions that the patient has received.  This has been established by [facility] in an effort to eliminate the dangerous, and often life threatening, practice of obtaining multiple prescriptions from different medical providers.   If you have a chronic pain syndrome (i.e. chronic headaches, recurrent back or neck pain, dental pain, abdominal or pelvis pain without a specific diagnosis, or  neuropathic pain such as fibromyalgia) or recurrent visits for the same condition without an acute diagnosis, you may be treated with non-narcotics and other non-addictive medicines.  Allergic reactions or negative side effects that may be reported by a patient to such medications will not typically lead to the use of a narcotic analgesic or other controlled substance as an alternative.   Patients managing chronic pain with a personal physician should have provisions in place for breakthrough pain.  If you are in crisis, you should call your physician.  If your physician directs you to the emergency department, please have the doctor call and speak to our attending physician concerning your care.   When patients come to the Emergency Department (ED) with acute medical conditions in which the Emergency Department physician feels appropriate to prescribe narcotic or sedating pain medication, the physician will prescribe these in very limited quantities.  The amount of these medications will last only until you can see your primary care physician in his/her office.  Any patient who returns to the ED seeking refills should expect only non-narcotic pain medications.   In the event of an acute medical condition exists and the emergency physician feels it is necessary that the patient be given a narcotic or sedating medication -  a responsible adult driver should be present in the room prior to the medication being given by the nurse.   Prescriptions for narcotic or sedating medications that have been lost, stolen or expired will not be refilled in the Emergency Department.    Patients who have chronic pain may receive non-narcotic prescriptions until seen by their primary care physician.  It is every patients personal responsibility to maintain active prescriptions with his or her primary care physician or specialist.

## 2014-08-25 ENCOUNTER — Ambulatory Visit (HOSPITAL_COMMUNITY)
Admission: RE | Admit: 2014-08-25 | Discharge: 2014-08-25 | Disposition: A | Discharge status: Home / Self Care | Payer: Medicare Other | Source: Ambulatory Visit | Attending: Internal Medicine | Admitting: Internal Medicine

## 2014-08-25 ENCOUNTER — Other Ambulatory Visit (HOSPITAL_COMMUNITY): Payer: Self-pay | Admitting: Internal Medicine

## 2014-08-25 DIAGNOSIS — R109 Unspecified abdominal pain: Secondary | ICD-10-CM

## 2014-08-25 DIAGNOSIS — R1012 Left upper quadrant pain: Secondary | ICD-10-CM | POA: Diagnosis present

## 2014-08-25 DIAGNOSIS — R1031 Right lower quadrant pain: Secondary | ICD-10-CM | POA: Diagnosis not present

## 2014-09-19 ENCOUNTER — Encounter (INDEPENDENT_AMBULATORY_CARE_PROVIDER_SITE_OTHER): Payer: Self-pay | Admitting: *Deleted

## 2014-10-26 ENCOUNTER — Ambulatory Visit (INDEPENDENT_AMBULATORY_CARE_PROVIDER_SITE_OTHER): Payer: Medicare Other | Admitting: Internal Medicine

## 2014-12-07 ENCOUNTER — Encounter (INDEPENDENT_AMBULATORY_CARE_PROVIDER_SITE_OTHER): Payer: Self-pay | Admitting: *Deleted

## 2014-12-07 ENCOUNTER — Telehealth (INDEPENDENT_AMBULATORY_CARE_PROVIDER_SITE_OTHER): Payer: Self-pay | Admitting: *Deleted

## 2014-12-07 NOTE — Telephone Encounter (Signed)
Wayne Rowe NO SHOWED for his apt on 10/26/14 with Dorene Arerri Setzer, NP. A NS letter has been mailed.

## 2015-02-12 ENCOUNTER — Emergency Department (HOSPITAL_COMMUNITY): Payer: Medicare Other

## 2015-02-12 ENCOUNTER — Emergency Department (HOSPITAL_COMMUNITY)
Admission: EM | Admit: 2015-02-12 | Discharge: 2015-02-12 | Disposition: A | Discharge status: Home / Self Care | Payer: Medicare Other | Attending: Emergency Medicine | Admitting: Emergency Medicine

## 2015-02-12 DIAGNOSIS — Z720 Tobacco use: Secondary | ICD-10-CM | POA: Insufficient documentation

## 2015-02-12 DIAGNOSIS — I951 Orthostatic hypotension: Secondary | ICD-10-CM | POA: Diagnosis not present

## 2015-02-12 DIAGNOSIS — F419 Anxiety disorder, unspecified: Secondary | ICD-10-CM | POA: Diagnosis not present

## 2015-02-12 DIAGNOSIS — R42 Dizziness and giddiness: Secondary | ICD-10-CM | POA: Diagnosis present

## 2015-02-12 DIAGNOSIS — Z7952 Long term (current) use of systemic steroids: Secondary | ICD-10-CM | POA: Insufficient documentation

## 2015-02-12 DIAGNOSIS — Z791 Long term (current) use of non-steroidal anti-inflammatories (NSAID): Secondary | ICD-10-CM | POA: Diagnosis not present

## 2015-02-12 DIAGNOSIS — Z79899 Other long term (current) drug therapy: Secondary | ICD-10-CM | POA: Diagnosis not present

## 2015-02-12 DIAGNOSIS — Z8739 Personal history of other diseases of the musculoskeletal system and connective tissue: Secondary | ICD-10-CM | POA: Diagnosis not present

## 2015-02-12 DIAGNOSIS — R079 Chest pain, unspecified: Secondary | ICD-10-CM | POA: Diagnosis not present

## 2015-02-12 LAB — CBC WITH DIFFERENTIAL/PLATELET
Basophils Absolute: 0 10*3/uL (ref 0.0–0.1)
Basophils Relative: 0 % (ref 0–1)
Eosinophils Absolute: 0.1 10*3/uL (ref 0.0–0.7)
Eosinophils Relative: 1 % (ref 0–5)
HCT: 44.6 % (ref 39.0–52.0)
Hemoglobin: 14.9 g/dL (ref 13.0–17.0)
LYMPHS PCT: 25 % (ref 12–46)
Lymphs Abs: 2.2 10*3/uL (ref 0.7–4.0)
MCH: 30.3 pg (ref 26.0–34.0)
MCHC: 33.4 g/dL (ref 30.0–36.0)
MCV: 90.7 fL (ref 78.0–100.0)
Monocytes Absolute: 0.9 10*3/uL (ref 0.1–1.0)
Monocytes Relative: 11 % (ref 3–12)
NEUTROS PCT: 63 % (ref 43–77)
Neutro Abs: 5.3 10*3/uL (ref 1.7–7.7)
Platelets: 282 10*3/uL (ref 150–400)
RBC: 4.92 MIL/uL (ref 4.22–5.81)
RDW: 13.6 % (ref 11.5–15.5)
WBC: 8.5 10*3/uL (ref 4.0–10.5)

## 2015-02-12 LAB — BASIC METABOLIC PANEL
Anion gap: 4 — ABNORMAL LOW (ref 5–15)
BUN: 20 mg/dL (ref 6–23)
CO2: 24 mmol/L (ref 19–32)
Calcium: 8 mg/dL — ABNORMAL LOW (ref 8.4–10.5)
Chloride: 112 mmol/L (ref 96–112)
Creatinine, Ser: 1.2 mg/dL (ref 0.50–1.35)
GFR calc Af Amer: 78 mL/min — ABNORMAL LOW (ref >90)
GFR, EST NON AFRICAN AMERICAN: 67 mL/min — AB (ref >90)
Glucose, Bld: 98 mg/dL (ref 70–99)
POTASSIUM: 3.3 mmol/L — AB (ref 3.5–5.1)
SODIUM: 140 mmol/L (ref 135–145)

## 2015-02-12 LAB — TROPONIN I: Troponin I: <0.03 ng/mL (ref <0.031)

## 2015-02-12 MED ORDER — SODIUM CHLORIDE 0.9 % IV BOLUS (SEPSIS)
1000.0000 mL | Freq: Once | INTRAVENOUS | Status: AC
  Administered 2015-02-12: 1000 mL via INTRAVENOUS

## 2015-02-12 MED ORDER — POTASSIUM CHLORIDE CRYS ER 20 MEQ PO TBCR
40.0000 meq | EXTENDED_RELEASE_TABLET | Freq: Once | ORAL | Status: AC
  Administered 2015-02-12: 40 meq via ORAL
  Filled 2015-02-12: qty 2

## 2015-02-12 NOTE — ED Notes (Signed)
Pt at [facility] picking up paper work.   Had a syncopal episode.   Pt hypotensive at scene.  Initial b/p 80/60.  Ems b/p  103/74.  cbg was 89.  EKG showed NSR.  Had 150  cc saline with EMS. Pt states he has felt dizzy the last couple of days.

## 2015-02-12 NOTE — Discharge Instructions (Signed)
Orthostatic Hypotension Orthostatic hypotension is a sudden drop in blood pressure. It happens when you quickly stand up from a seated or lying position. You may feel dizzy or light-headed. This can last for just a few seconds or for up to a few minutes. It is usually not a serious problem. However, if this happens frequently or gets worse, it can be a sign of something more serious. CAUSES  Different things can cause orthostatic hypotension, including:   Loss of body fluids (dehydration).  Medicines that lower blood pressure.  Sudden changes in posture, such as standing up quickly after you have been sitting or lying down.  Taking too much of your medicine. SIGNS AND SYMPTOMS   Light-headedness or dizziness.   Fainting or near-fainting.   A fast heart rate.   Weakness.   Feeling tired (fatigue).  DIAGNOSIS  Your health care provider may do several things to help diagnose your condition and identify the cause. These may include:   Taking a medical history and doing a physical exam.  Checking your blood pressure. Your health care provider will check your blood pressure when you are:  Lying down.  Sitting.  Standing.  Using tilt table testing. In this test, you lie down on a table that moves from a lying position to a standing position. You will be strapped onto the table. This test monitors your blood pressure and heart rate when you are in different positions. TREATMENT  Treatment will vary depending on the cause. Possible treatments include:   Changing the dosage of your medicines.  Wearing compression stockings on your lower legs.  Standing up slowly after sitting or lying down.  Eating more salt.  Eating frequent, small meals.  In some cases, getting IV fluids.  Taking medicine to enhance fluid retention. HOME CARE INSTRUCTIONS  Only take over-the-counter or prescription medicines as directed by your health care provider.  Follow your health care  provider's instructions for changing the dosage of your current medicines.  Do not stop or adjust your medicine on your own.  Stand up slowly after sitting or lying down. This allows your body to adjust to the different position.  Wear compression stockings as directed.  Eat extra salt as directed.  Do not add extra salt to your diet unless directed to by your health care provider.  Eat frequent, small meals.  Avoid standing suddenly after eating.  Avoid hot showers or excessive heat as directed by your health care provider.  Keep all follow-up appointments. SEEK MEDICAL CARE IF:  You continue to feel dizzy or light-headed after standing.  You feel groggy or confused.  You feel cold, clammy, or sick to your stomach (nauseous).  You have blurred vision.  You feel short of breath. SEEK IMMEDIATE MEDICAL CARE IF:   You faint after standing.  You have chest pain.  You have difficulty breathing.   You lose feeling or movement in your arms or legs.   You have slurred speech or difficulty talking, or you are unable to talk.  MAKE SURE YOU:   Understand these instructions.  Will watch your condition.  Will get help right away if you are not doing well or get worse. Document Released: 09/26/2002 Document Revised: 10/11/2013 Document Reviewed: 07/29/2013 [employer] Patient Information 2015 [employer]. This information is not intended to replace advice given to you by your health care provider. Make sure you discuss any questions you have with your health care provider. Dehydration, Adult Dehydration is when you lose more   lose more fluids from the body than you take in. Vital organs like the kidneys, brain, and heart cannot function without a proper amount of fluids and salt. Any loss of fluids from the body can cause dehydration.  CAUSES   Vomiting.  Diarrhea.  Excessive sweating.  Excessive urine output.  Fever. SYMPTOMS  Mild dehydration  Thirst.  Dry  lips.  Slightly dry mouth. Moderate dehydration  Very dry mouth.  Sunken eyes.  Skin does not bounce back quickly when lightly pinched and released.  Dark urine and decreased urine production.  Decreased tear production.  Headache. Severe dehydration  Very dry mouth.  Extreme thirst.  Rapid, weak pulse (more than 100 beats per minute at rest).  Cold hands and feet.  Not able to sweat in spite of heat and temperature.  Rapid breathing.  Blue lips.  Confusion and lethargy.  Difficulty being awakened.  Minimal urine production.  No tears. DIAGNOSIS  Your caregiver will diagnose dehydration based on your symptoms and your exam. Blood and urine tests will help confirm the diagnosis. The diagnostic evaluation should also identify the cause of dehydration. TREATMENT  Treatment of mild or moderate dehydration can often be done at home by increasing the amount of fluids that you drink. It is best to drink small amounts of fluid more often. Drinking too much at one time can make vomiting worse. Refer to the home care instructions below. Severe dehydration needs to be treated at the hospital where you will probably be given intravenous (IV) fluids that contain water and electrolytes. HOME CARE INSTRUCTIONS   Ask your caregiver about specific rehydration instructions.  Drink enough fluids to keep your urine clear or pale yellow.  Drink small amounts frequently if you have nausea and vomiting.  Eat as you normally do.  Avoid:  Foods or drinks high in sugar.  Carbonated drinks.  Juice.  Extremely hot or cold fluids.  Drinks with caffeine.  Fatty, greasy foods.  Alcohol.  Tobacco.  Overeating.  Gelatin desserts.  Wash your hands well to avoid spreading bacteria and viruses.  Only take over-the-counter or prescription medicines for pain, discomfort, or fever as directed by your caregiver.  Ask your caregiver if you should continue all prescribed and  over-the-counter medicines.  Keep all follow-up appointments with your caregiver. SEEK MEDICAL CARE IF:  You have abdominal pain and it increases or stays in one area (localizes).  You have a rash, stiff neck, or severe headache.  You are irritable, sleepy, or difficult to awaken.  You are weak, dizzy, or extremely thirsty. SEEK IMMEDIATE MEDICAL CARE IF:   You are unable to keep fluids down or you get worse despite treatment.  You have frequent episodes of vomiting or diarrhea.  You have blood or green matter (bile) in your vomit.  You have blood in your stool or your stool looks black and tarry.  You have not urinated in 6 to 8 hours, or you have only urinated a small amount of very dark urine.  You have a fever.  You faint. MAKE SURE YOU:   Understand these instructions.  Will watch your condition.  Will get help right away if you are not doing well or get worse. Document Released: 10/06/2005 Document Revised: 12/29/2011 Document Reviewed: 05/26/2011 [facility] Patient Information 2015 [place], [place]. This information is not intended to replace advice given to you by your health care provider. Make sure you discuss any questions you have with your health care provider.

## 2015-02-12 NOTE — ED Provider Notes (Signed)
TIME SEEN: 6:00 PM  CHIEF COMPLAINT: Near syncope, lightheadedness with standing  HPI: Pt is a 54 y.o. male with history of degenerative disc disease, anxiety who presents to the emergency department with 2 days of lightheadedness with standing. No vertigo. States he has had some right-sided chest pain is worse with palpation but no other chest pain with these episodes or shortness of breath. No diaphoresis. No vomiting. Patient denies diarrhea, bloody stool or melena. His significant other at bedside states that he has had diarrhea for the past several days. He denies any abdominal pain. No history of PE or DVT. No history of CAD. Significant other reports that he does not eat or drink well. He denies any alcohol use.  ROS: See HPI Constitutional: no fever  Eyes: no drainage  ENT: no runny nose   Cardiovascular: Right chest pain  Resp: no SOB  GI: no vomiting GU: no dysuria Integumentary: no rash  Allergy: no hives  Musculoskeletal: no leg swelling  Neurological: no slurred speech ROS otherwise negative  PAST MEDICAL HISTORY/PAST SURGICAL HISTORY:  Past Medical History  Diagnosis Date  . Arthritis   . DDD (degenerative disc disease)   . Anxiety     MEDICATIONS:  Prior to Admission medications   Medication Sig Start Date End Date Taking? Authorizing Provider  carisoprodol (SOMA) 350 MG tablet Take 350 mg by mouth 4 (four) times daily as needed for muscle spasms.    Historical Provider, MD  celecoxib (CELEBREX) 200 MG capsule Take 200 mg by mouth daily.    Historical Provider, MD  cyclobenzaprine (FLEXERIL) 10 MG tablet Take 1 tablet (10 mg total) by mouth 3 (three) times daily as needed for muscle spasms. 05/26/14   Devoria AlbeIva Knapp, MD  diazepam (VALIUM) 10 MG tablet Take 10 mg by mouth 4 (four) times daily as needed for anxiety.    Historical Provider, MD  doxepin (SINEQUAN) 100 MG capsule Take 100 mg by mouth at bedtime as needed. FOR SLEEP    Historical Provider, MD   HYDROcodone-acetaminophen (NORCO) 10-325 MG per tablet Take 1 tablet by mouth every 6 (six) hours as needed for pain.    Historical Provider, MD  naproxen (NAPROSYN) 500 MG tablet Take 1 tablet (500 mg total) by mouth 2 (two) times daily. 05/26/14   Devoria AlbeIva Knapp, MD  predniSONE (DELTASONE) 20 MG tablet Take 3 tablets (60 mg total) by mouth daily. 12/08/13   Benjiman CoreNathan Pickering, MD  pregabalin (LYRICA) 200 MG capsule Take 200 mg by mouth 3 (three) times daily.    Historical Provider, MD  silver sulfADIAZINE (SILVADENE) 1 % cream Apply 1 application topically daily. Use daily to all burn areas 11/08/13   Bethann BerkshireJoseph Zammit, MD    ALLERGIES:  No Known Allergies  SOCIAL HISTORY:  History  Substance Use Topics  . Smoking status: Current Every Day Smoker -- 1.00 packs/day    Types: Cigarettes  . Smokeless tobacco: Not on file  . Alcohol Use: No    FAMILY HISTORY: No family history on file.  EXAM: BP 110/79 mmHg  Pulse 76  Temp(Src) 97.5 F (36.4 C) (Oral)  Resp 20  Ht 6' (1.829 m)  Wt 135 lb (61.236 kg)  BMI 18.31 kg/m2  SpO2 100% CONSTITUTIONAL: Alert and oriented and responds appropriately to questions. Well-appearing; well-nourished HEAD: Normocephalic EYES: Conjunctivae clear, PERRL ENT: normal nose; no rhinorrhea; moist mucous membranes; pharynx without lesions noted NECK: Supple, no meningismus, no LAD  CARD: RRR; S1 and S2 appreciated; no murmurs, no clicks,  no rubs, no gallops RESP: Normal chest excursion without splinting or tachypnea; breath sounds clear and equal bilaterally; no wheezes, no rhonchi, no rales, tender to palpation over the right chest wall without crepitus or ecchymosis or deformity and this reproduces his pain, no hypoxia or respiratory distress ABD/GI: Normal bowel sounds; non-distended; soft, non-tender, no rebound, no guarding BACK:  The back appears normal and is non-tender to palpation, there is no CVA tenderness, no midline spinous as or step-off or  deformity EXT: Normal ROM in all joints; non-tender to palpation; no edema; normal capillary refill; no cyanosis, no calf tenderness or swelling    SKIN: Normal color for age and race; warm NEURO: Moves all extremities equally, sensation to light touch intact diffusely, cranial nerves II through XII intact PSYCH: The patient's mood and manner are appropriate. Grooming and personal hygiene are appropriate.  MEDICAL DECISION MAKING: Patient here with what sounds like orthostatic hypotension. Had received 1 L of IV fluids with EMS. Blood glucose was 89. He has no history of CAD but is a smoker. No history of PE or DVT. Right-sided chest pain is reproducible with palpation of his chest wall. No shortness of breath. Neurologically intact. Will check labs, orthostatic vital signs and continue IV hydration. EKG shows no ischemic changes, arrhythmia or interval changes.  ED PROGRESS: Patient's labs are unremarkable other than mild hypokalemia. We'll replace. Chest x-ray shows no acute cardio pulmonary disease but he does have multiple mild age-indeterminate compression fractures of the upper lumbar vertebral bodies. No midline spinal tenderness. No history of back injury. He is neurologically intact. Suspect these are old. Discussed this finding with patient. Reports he is feeling much better after 2 L of IV fluid. I have recommended to patient that he increases his fluid intake. Discussed return precautions. He verbalized understanding and is comfortable with plan.      Date: 02/12/2015 17:41  Rate: 73  Rhythm: normal sinus rhythm  QRS Axis: normal  Intervals: normal  ST/T Wave abnormalities: normal  Conduction Disutrbances: none  Narrative Interpretation: unremarkable, no ischemic changes, no arrhythmia, no interval changes      Layla Maw Lakaisha Danish, DO 02/12/15 1951

## 2015-06-03 ENCOUNTER — Encounter (HOSPITAL_COMMUNITY): Payer: Self-pay | Admitting: *Deleted

## 2015-06-03 ENCOUNTER — Emergency Department (HOSPITAL_COMMUNITY)
Admission: EM | Admit: 2015-06-03 | Discharge: 2015-06-03 | Disposition: A | Discharge status: Home / Self Care | Payer: Medicare Other | Attending: Emergency Medicine | Admitting: Emergency Medicine

## 2015-06-03 DIAGNOSIS — M1990 Unspecified osteoarthritis, unspecified site: Secondary | ICD-10-CM | POA: Insufficient documentation

## 2015-06-03 DIAGNOSIS — I959 Hypotension, unspecified: Secondary | ICD-10-CM | POA: Diagnosis present

## 2015-06-03 DIAGNOSIS — Z720 Tobacco use: Secondary | ICD-10-CM | POA: Insufficient documentation

## 2015-06-03 DIAGNOSIS — Z79899 Other long term (current) drug therapy: Secondary | ICD-10-CM | POA: Insufficient documentation

## 2015-06-03 DIAGNOSIS — I9589 Other hypotension: Secondary | ICD-10-CM | POA: Insufficient documentation

## 2015-06-03 DIAGNOSIS — F419 Anxiety disorder, unspecified: Secondary | ICD-10-CM | POA: Insufficient documentation

## 2015-06-03 DIAGNOSIS — N179 Acute kidney failure, unspecified: Secondary | ICD-10-CM

## 2015-06-03 DIAGNOSIS — E860 Dehydration: Secondary | ICD-10-CM

## 2015-06-03 DIAGNOSIS — Z791 Long term (current) use of non-steroidal anti-inflammatories (NSAID): Secondary | ICD-10-CM | POA: Insufficient documentation

## 2015-06-03 DIAGNOSIS — Z7952 Long term (current) use of systemic steroids: Secondary | ICD-10-CM | POA: Diagnosis not present

## 2015-06-03 DIAGNOSIS — Z792 Long term (current) use of antibiotics: Secondary | ICD-10-CM | POA: Diagnosis not present

## 2015-06-03 HISTORY — DX: Dorsalgia, unspecified: M54.9

## 2015-06-03 LAB — CBC WITH DIFFERENTIAL/PLATELET
Basophils Absolute: 0.1 10*3/uL (ref 0.0–0.1)
Basophils Relative: 1 % (ref 0–1)
EOS ABS: 0.3 10*3/uL (ref 0.0–0.7)
EOS PCT: 3 % (ref 0–5)
HEMATOCRIT: 40.1 % (ref 39.0–52.0)
Hemoglobin: 13.4 g/dL (ref 13.0–17.0)
LYMPHS PCT: 25 % (ref 12–46)
Lymphs Abs: 2.5 10*3/uL (ref 0.7–4.0)
MCH: 30.7 pg (ref 26.0–34.0)
MCHC: 33.4 g/dL (ref 30.0–36.0)
MCV: 91.8 fL (ref 78.0–100.0)
Monocytes Absolute: 0.9 10*3/uL (ref 0.1–1.0)
Monocytes Relative: 9 % (ref 3–12)
Neutro Abs: 6.2 10*3/uL (ref 1.7–7.7)
Neutrophils Relative %: 62 % (ref 43–77)
PLATELETS: 263 10*3/uL (ref 150–400)
RBC: 4.37 MIL/uL (ref 4.22–5.81)
RDW: 13.1 % (ref 11.5–15.5)
WBC: 9.9 10*3/uL (ref 4.0–10.5)

## 2015-06-03 LAB — TROPONIN I

## 2015-06-03 LAB — BASIC METABOLIC PANEL
Anion gap: 9 (ref 5–15)
BUN: 16 mg/dL (ref 6–20)
CO2: 26 mmol/L (ref 22–32)
Calcium: 8.2 mg/dL — ABNORMAL LOW (ref 8.9–10.3)
Chloride: 103 mmol/L (ref 101–111)
Creatinine, Ser: 1.79 mg/dL — ABNORMAL HIGH (ref 0.61–1.24)
GFR calc Af Amer: 48 mL/min — ABNORMAL LOW (ref >60)
GFR calc non Af Amer: 41 mL/min — ABNORMAL LOW (ref >60)
Glucose, Bld: 117 mg/dL — ABNORMAL HIGH (ref 65–99)
Potassium: 3.6 mmol/L (ref 3.5–5.1)
Sodium: 138 mmol/L (ref 135–145)

## 2015-06-03 MED ORDER — SODIUM CHLORIDE 0.9 % IV SOLN
INTRAVENOUS | Status: DC
  Administered 2015-06-03: 20:00:00 via INTRAVENOUS

## 2015-06-03 MED ORDER — ACETAMINOPHEN 500 MG PO TABS
ORAL_TABLET | ORAL | Status: AC
  Filled 2015-06-03: qty 2

## 2015-06-03 MED ORDER — SODIUM CHLORIDE 0.9 % IV BOLUS (SEPSIS)
1000.0000 mL | Freq: Once | INTRAVENOUS | Status: AC
  Administered 2015-06-03: 1000 mL via INTRAVENOUS

## 2015-06-03 MED ORDER — ACETAMINOPHEN 500 MG PO TABS
1000.0000 mg | ORAL_TABLET | Freq: Once | ORAL | Status: AC
  Administered 2015-06-03: 1000 mg via ORAL

## 2015-06-03 NOTE — ED Notes (Signed)
Patient c/o of feeling "dizzy headed" at church, checked BP at home and BP ranged from 76 systolic to 87 systolic.

## 2015-06-03 NOTE — Discharge Instructions (Signed)
Stay well-hydrated throughout the day with water.  If you were given medicines take as directed.  If you are on coumadin or contraceptives realize their levels and effectiveness is altered by many different medicines.  If you have any reaction (rash, tongues swelling, other) to the medicines stop taking and see a physician.    If your blood pressure was elevated in the ER make sure you follow up for management with a primary doctor or return for chest pain, shortness of breath or stroke symptoms.  Please follow up as directed and return to the ER or see a physician for new or worsening symptoms.  Thank you. Filed Vitals:   06/03/15 1701 06/03/15 1715 06/03/15 1730 06/03/15 1800  BP: 92/59  Pulse: 96 87 80 69  Temp: 97.9 F (36.6 C)     TempSrc: Oral     Resp: Height: 6' (1.829 m)     Weight: 160 lb (72.576 kg)     SpO2: 98% 97% 94% 96%    Dehydration, Adult Dehydration means your body does not have as much fluid as it needs. Your kidneys, brain, and heart will not work properly without the right amount of fluids and salt.  HOME CARE  Ask your doctor how to replace body fluid losses (rehydrate).  Drink enough fluids to keep your pee (urine) clear or pale yellow.  Drink small amounts of fluids often if you feel sick to your stomach (nauseous) or throw up (vomit).  Eat like you normally do.  Avoid:  Foods or drinks high in sugar.  Bubbly (carbonated) drinks.  Juice.  Very hot or cold fluids.  Drinks with caffeine.  Fatty, greasy foods.  Alcohol.  Tobacco.  Eating too much.  Gelatin desserts.  Wash your hands to avoid spreading germs (bacteria, viruses).  Only take medicine as told by your doctor.  Keep all doctor visits as told. GET HELP RIGHT AWAY IF:   You cannot drink something without throwing up.  You get worse even with treatment.  Your vomit has blood in it or looks greenish.  Your poop (stool) has blood in it or  looks black and tarry.  You have not peed in 6 to 8 hours.  You pee a small amount of very dark pee.  You have a fever.  You pass out (faint).  You have belly (abdominal) pain that gets worse or stays in one spot (localizes).  You have a rash, stiff neck, or bad headache.  You get easily annoyed, sleepy, or are hard to wake up.  You feel weak, dizzy, or very thirsty. MAKE SURE YOU:   Understand these instructions.  Will watch your condition.  Will get help right away if you are not doing well or get worse. Document Released: 08/02/2009 Document Revised: 12/29/2011 Document Reviewed: 05/26/2011 [facility] Patient Information 2015 [place], [place]. This information is not intended to replace advice given to you by your health care provider. Make sure you discuss any questions you have with your health care provider.

## 2015-06-03 NOTE — ED Provider Notes (Signed)
CSN: [id number]     Arrival date & time 06/03/15  1652 History   First MD Initiated Contact with Patient 06/03/15 1710     Chief Complaint  Patient presents with  . Hypotension     (Consider location/radiation/quality/duration/timing/severity/associated sxs/prior Treatment) HPI Comments: 54 year old male with history of arthritis, degenerative disc disease, anxiety, current smoker, chronic narcotic use presents with lightheadedness and hypotension. Patient had systolic blood pressure ranging from mid 70s to upper 80s since this morning. Patient fell lightheaded worse with standing at church. Patient had this once in the past. Patient does not drink significant water through the day. Patient was observed for a few hours in the heat however says cc mostly been inside. No chest pain or significant source of breath. No recent surgeries no blood clot history no blood in the stools. No cardiac history known. Patient drinks soda regularly.  The history is provided by the patient.    Past Medical History  Diagnosis Date  . Arthritis   . DDD (degenerative disc disease)   . Anxiety   . Back pain    Past Surgical History  Procedure Laterality Date  . Back surgery     History reviewed. No pertinent family history. Social History  Substance Use Topics  . Smoking status: Current Every Day Smoker -- 1.00 packs/day    Types: Cigarettes  . Smokeless tobacco: None  . Alcohol Use: No    Review of Systems  Constitutional: Positive for fatigue. Negative for fever and chills.  HENT: Negative for congestion.   Eyes: Negative for visual disturbance.  Respiratory: Negative for shortness of breath.   Cardiovascular: Negative for chest pain.  Gastrointestinal: Negative for vomiting and abdominal pain.  Genitourinary: Negative for dysuria and flank pain.  Musculoskeletal: Negative for back pain, neck pain and neck stiffness.  Skin: Negative for rash.  Neurological: Positive for light-headedness.  Negative for headaches.      Allergies  Review of patient's allergies indicates no known allergies.  Home Medications   Prior to Admission medications   Medication Sig Start Date End Date Taking? Authorizing Provider  carisoprodol (SOMA) 350 MG tablet Take 350 mg by mouth 4 (four) times daily as needed for muscle spasms.   Yes Historical Provider, MD  celecoxib (CELEBREX) 200 MG capsule Take 200 mg by mouth daily.   Yes Historical Provider, MD  diazepam (VALIUM) 10 MG tablet Take 10 mg by mouth 4 (four) times daily.    Yes Historical Provider, MD  HYDROcodone-acetaminophen (NORCO) 10-325 MG per tablet Take 1 tablet by mouth every 6 (six) hours as needed for pain.   Yes Historical Provider, MD  Multiple Vitamin (MULTIVITAMIN WITH MINERALS) TABS tablet Take 1 tablet by mouth daily.   Yes Historical Provider, MD  Polyvinyl Alcohol-Povidone (REFRESH OP) Place 1 drop into both eyes 3 (three) times daily.   Yes Historical Provider, MD  pregabalin (LYRICA) 200 MG capsule Take 200 mg by mouth 3 (three) times daily.   Yes Historical Provider, MD  tamsulosin (FLOMAX) 0.4 MG CAPS capsule Take 0.4 mg by mouth daily. 04/12/15  Yes Historical Provider, MD  cyclobenzaprine (FLEXERIL) 10 MG tablet Take 1 tablet (10 mg total) by mouth 3 (three) times daily as needed for muscle spasms. 05/26/14   Devoria Albe, MD  doxepin (SINEQUAN) 100 MG capsule Take 100 mg by mouth at bedtime as needed. FOR SLEEP    Historical Provider, MD  naproxen (NAPROSYN) 500 MG tablet Take 1 tablet (500 mg total) by mouth 2 (  two) times daily. 05/26/14   Devoria Albe, MD  predniSONE (DELTASONE) 20 MG tablet Take 3 tablets (60 mg total) by mouth daily. 12/08/13   Benjiman Core, MD  silver sulfADIAZINE (SILVADENE) 1 % cream Apply 1 application topically daily. Use daily to all burn areas 11/08/13   Bethann Berkshire, MD   BP 96/63 mmHg  Pulse 61  Temp(Src) 97.9 F (36.6 C) (Oral)  Resp 18  Ht 6' (1.829 m)  Wt 160 lb (72.576 kg)  BMI 21.70  kg/m2  SpO2 97% Physical Exam  Constitutional: He is oriented to person, place, and time. He appears well-developed and well-nourished.  HENT:  Head: Normocephalic and atraumatic.  Dry mucous membranes  Eyes: Conjunctivae are normal. Right eye exhibits no discharge. Left eye exhibits no discharge.  Neck: Normal range of motion. Neck supple. No tracheal deviation present.  Cardiovascular: Normal rate, regular rhythm and intact distal pulses.   Pulmonary/Chest: Effort normal and breath sounds normal.  Abdominal: Soft. He exhibits no distension. There is no tenderness. There is no guarding.  Musculoskeletal: He exhibits no edema.  Neurological: He is alert and oriented to person, place, and time.  Skin: Skin is warm. No rash noted.  Psychiatric: He has a normal mood and affect.  Nursing note and vitals reviewed.   ED Course  Procedures (including critical care time)   EMERGENCY DEPARTMENT [place] CARDIAC EXAM "Study: Limited Ultrasound of the heart and pericardium"  INDICATIONS:Hypotension Multiple views of the heart and pericardium were obtained in real-time with a multi-frequency probe.  PERFORMED RU:EAVWUJ  IMAGES ARCHIVED?: Yes  FINDINGS: No pericardial effusion, Normal contractility and IVC flat  LIMITATIONS:  Body habitus  VIEWS USED: Subcostal 4 chamber, Parasternal long axis, Parasternal short axis, Apical 4 chamber  and Inferior Vena Cava  INTERPRETATION: Cardiac activity present, Pericardial effusioin absent, Cardiac tamponade absent and Probable low CVP  CPT Code: 81191-47 (limited transthoracic cardiac) Labs Review Labs Reviewed  BASIC METABOLIC PANEL - Abnormal; Notable for the following:    Glucose, Bld 117 (*)    Creatinine, Ser 1.79 (*)    Calcium 8.2 (*)    GFR calc non Af Amer 41 (*)    GFR calc Af Amer 48 (*)    All other components within normal limits  CBC WITH DIFFERENTIAL/PLATELET  TROPONIN I    Imaging Review No results found. I, Enid Skeens, personally reviewed and evaluated these images and lab results as part of my medical decision-making.   EKG Interpretation   Date/Time:  Sunday June 03 2015 17:20:35 EDT Ventricular Rate:  84 PR Interval:  117 QRS Duration: 86 QT Interval:  377 QTC Calculation: 446 R Axis:   52 Text Interpretation:  Sinus rhythm Borderline short PR interval Probable  left atrial enlargement RSR' in V1 or V2, right VCD or RVH Confirmed by  Branson Kranz  MD, Harmoni Lucus ([id number]) on 06/03/2015 6:14:18 PM      MDM   Final diagnoses:  Transient hypotension  Dehydration  Acute renal failure, unspecified acute renal failure type   Well-appearing patient presents with hypotension and clinically concern for dehydration. Plan for cardiac screen with hypotension. Bedside ultrasound showed normal gross ejection fraction and IVC consistent with dehydration. Patient improved on reassessment, patient received 2-1/2 L of fluid. Patient telling oral fluids without difficulty. Discussed continued oral fluid hydration follow-up with primary Dr. Bea Laura. Results and differential diagnosis were discussed with the patient/parent/guardian. Xrays were independently reviewed by myself.  Close follow up outpatient was discussed, comfortable with  the plan.   Medications  0.9 %  sodium chloride infusion ( Intravenous New Bag/Given 06/03/15 1946)  sodium chloride 0.9 % bolus 1,000 mL (0 mLs Intravenous Stopped 06/03/15 1818)  sodium chloride 0.9 % bolus 1,000 mL (0 mLs Intravenous Stopped 06/03/15 1922)    Filed Vitals:   06/03/15 1800 06/03/15 1830 06/03/15 1900 06/03/15 1930  BP: 92/59 97/59 99/59  96/63  Pulse: 69 66 65 61  Temp:      TempSrc:      Resp: 19 19 20 18   Height:      Weight:      SpO2: 96% 97% 97% 97%    Final diagnoses:  Transient hypotension  Dehydration  Acute renal failure, unspecified acute renal failure type       Blane Ohara, MD 06/03/15 2006

## 2015-06-06 ENCOUNTER — Other Ambulatory Visit (HOSPITAL_COMMUNITY): Payer: Self-pay | Admitting: Physical Medicine and Rehabilitation

## 2015-06-06 DIAGNOSIS — R29898 Other symptoms and signs involving the musculoskeletal system: Secondary | ICD-10-CM

## 2015-06-06 DIAGNOSIS — M79605 Pain in left leg: Principal | ICD-10-CM

## 2015-06-06 DIAGNOSIS — M79604 Pain in right leg: Secondary | ICD-10-CM

## 2015-06-11 ENCOUNTER — Ambulatory Visit (HOSPITAL_COMMUNITY)
Admission: RE | Admit: 2015-06-11 | Discharge: 2015-06-11 | Disposition: A | Discharge status: Home / Self Care | Payer: Medicare Other | Source: Ambulatory Visit | Attending: Physical Medicine and Rehabilitation | Admitting: Physical Medicine and Rehabilitation

## 2015-06-11 DIAGNOSIS — M5127 Other intervertebral disc displacement, lumbosacral region: Secondary | ICD-10-CM | POA: Diagnosis not present

## 2015-06-11 DIAGNOSIS — R29898 Other symptoms and signs involving the musculoskeletal system: Secondary | ICD-10-CM

## 2015-06-11 DIAGNOSIS — M545 Low back pain: Secondary | ICD-10-CM | POA: Diagnosis present

## 2015-06-11 DIAGNOSIS — M79604 Pain in right leg: Secondary | ICD-10-CM

## 2015-06-11 DIAGNOSIS — M5136 Other intervertebral disc degeneration, lumbar region: Secondary | ICD-10-CM | POA: Insufficient documentation

## 2015-06-11 DIAGNOSIS — M5126 Other intervertebral disc displacement, lumbar region: Secondary | ICD-10-CM | POA: Insufficient documentation

## 2015-06-11 DIAGNOSIS — M79605 Pain in left leg: Secondary | ICD-10-CM

## 2015-06-20 ENCOUNTER — Other Ambulatory Visit (HOSPITAL_COMMUNITY): Payer: Self-pay | Admitting: Physical Medicine and Rehabilitation

## 2015-06-20 DIAGNOSIS — M542 Cervicalgia: Secondary | ICD-10-CM

## 2015-07-03 ENCOUNTER — Ambulatory Visit (HOSPITAL_COMMUNITY)
Admission: RE | Admit: 2015-07-03 | Discharge: 2015-07-03 | Disposition: A | Discharge status: Home / Self Care | Payer: Medicare Other | Source: Ambulatory Visit | Attending: Physical Medicine and Rehabilitation | Admitting: Physical Medicine and Rehabilitation

## 2015-07-03 DIAGNOSIS — M5031 Other cervical disc degeneration,  high cervical region: Secondary | ICD-10-CM | POA: Insufficient documentation

## 2015-07-03 DIAGNOSIS — M4692 Unspecified inflammatory spondylopathy, cervical region: Secondary | ICD-10-CM | POA: Diagnosis not present

## 2015-07-03 DIAGNOSIS — R200 Anesthesia of skin: Secondary | ICD-10-CM | POA: Insufficient documentation

## 2015-07-03 DIAGNOSIS — M542 Cervicalgia: Secondary | ICD-10-CM | POA: Insufficient documentation

## 2015-07-03 DIAGNOSIS — M4802 Spinal stenosis, cervical region: Secondary | ICD-10-CM | POA: Diagnosis not present

## 2015-07-26 DIAGNOSIS — M5481 Occipital neuralgia: Secondary | ICD-10-CM | POA: Diagnosis not present

## 2015-07-26 DIAGNOSIS — M47812 Spondylosis without myelopathy or radiculopathy, cervical region: Secondary | ICD-10-CM | POA: Diagnosis not present

## 2015-07-26 DIAGNOSIS — G894 Chronic pain syndrome: Secondary | ICD-10-CM | POA: Diagnosis not present

## 2015-07-26 DIAGNOSIS — M542 Cervicalgia: Secondary | ICD-10-CM | POA: Diagnosis not present

## 2015-07-27 DIAGNOSIS — J449 Chronic obstructive pulmonary disease, unspecified: Secondary | ICD-10-CM | POA: Diagnosis not present

## 2015-07-27 DIAGNOSIS — N400 Enlarged prostate without lower urinary tract symptoms: Secondary | ICD-10-CM | POA: Diagnosis not present

## 2015-07-27 DIAGNOSIS — N529 Male erectile dysfunction, unspecified: Secondary | ICD-10-CM | POA: Diagnosis not present

## 2015-07-27 DIAGNOSIS — Z6821 Body mass index (BMI) 21.0-21.9, adult: Secondary | ICD-10-CM | POA: Diagnosis not present

## 2015-07-27 DIAGNOSIS — G894 Chronic pain syndrome: Secondary | ICD-10-CM | POA: Diagnosis not present

## 2015-07-27 DIAGNOSIS — M47812 Spondylosis without myelopathy or radiculopathy, cervical region: Secondary | ICD-10-CM | POA: Diagnosis not present

## 2015-07-27 DIAGNOSIS — Z23 Encounter for immunization: Secondary | ICD-10-CM | POA: Diagnosis not present

## 2015-07-27 DIAGNOSIS — F1120 Opioid dependence, uncomplicated: Secondary | ICD-10-CM | POA: Diagnosis not present

## 2015-08-14 DIAGNOSIS — M5415 Radiculopathy, thoracolumbar region: Secondary | ICD-10-CM | POA: Diagnosis not present

## 2015-08-14 DIAGNOSIS — M5481 Occipital neuralgia: Secondary | ICD-10-CM | POA: Diagnosis not present

## 2015-08-14 DIAGNOSIS — M47812 Spondylosis without myelopathy or radiculopathy, cervical region: Secondary | ICD-10-CM | POA: Diagnosis not present

## 2015-08-14 DIAGNOSIS — G894 Chronic pain syndrome: Secondary | ICD-10-CM | POA: Diagnosis not present

## 2015-08-14 DIAGNOSIS — M4806 Spinal stenosis, lumbar region: Secondary | ICD-10-CM | POA: Diagnosis not present

## 2015-08-14 DIAGNOSIS — M545 Low back pain: Secondary | ICD-10-CM | POA: Diagnosis not present

## 2015-08-14 DIAGNOSIS — M5137 Other intervertebral disc degeneration, lumbosacral region: Secondary | ICD-10-CM | POA: Diagnosis not present

## 2015-08-14 DIAGNOSIS — M961 Postlaminectomy syndrome, not elsewhere classified: Secondary | ICD-10-CM | POA: Diagnosis not present

## 2015-08-21 DIAGNOSIS — M47812 Spondylosis without myelopathy or radiculopathy, cervical region: Secondary | ICD-10-CM | POA: Diagnosis not present

## 2015-08-21 DIAGNOSIS — M5481 Occipital neuralgia: Secondary | ICD-10-CM | POA: Diagnosis not present

## 2015-08-21 DIAGNOSIS — M542 Cervicalgia: Secondary | ICD-10-CM | POA: Diagnosis not present

## 2015-08-29 DIAGNOSIS — G894 Chronic pain syndrome: Secondary | ICD-10-CM | POA: Diagnosis not present

## 2015-08-29 DIAGNOSIS — Z1389 Encounter for screening for other disorder: Secondary | ICD-10-CM | POA: Diagnosis not present

## 2015-08-29 DIAGNOSIS — Z6821 Body mass index (BMI) 21.0-21.9, adult: Secondary | ICD-10-CM | POA: Diagnosis not present

## 2015-08-29 DIAGNOSIS — J449 Chronic obstructive pulmonary disease, unspecified: Secondary | ICD-10-CM | POA: Diagnosis not present

## 2015-09-04 DIAGNOSIS — G894 Chronic pain syndrome: Secondary | ICD-10-CM | POA: Diagnosis not present

## 2015-09-04 DIAGNOSIS — M545 Low back pain: Secondary | ICD-10-CM | POA: Diagnosis not present

## 2015-09-04 DIAGNOSIS — M5137 Other intervertebral disc degeneration, lumbosacral region: Secondary | ICD-10-CM | POA: Diagnosis not present

## 2015-09-04 DIAGNOSIS — M4806 Spinal stenosis, lumbar region: Secondary | ICD-10-CM | POA: Diagnosis not present

## 2015-09-04 DIAGNOSIS — M961 Postlaminectomy syndrome, not elsewhere classified: Secondary | ICD-10-CM | POA: Diagnosis not present

## 2015-10-29 DIAGNOSIS — J449 Chronic obstructive pulmonary disease, unspecified: Secondary | ICD-10-CM | POA: Diagnosis not present

## 2015-10-29 DIAGNOSIS — G894 Chronic pain syndrome: Secondary | ICD-10-CM | POA: Diagnosis not present

## 2015-10-29 DIAGNOSIS — Z1389 Encounter for screening for other disorder: Secondary | ICD-10-CM | POA: Diagnosis not present

## 2015-10-29 DIAGNOSIS — Z681 Body mass index (BMI) 19 or less, adult: Secondary | ICD-10-CM | POA: Diagnosis not present

## 2015-12-18 DIAGNOSIS — M5415 Radiculopathy, thoracolumbar region: Secondary | ICD-10-CM | POA: Diagnosis not present

## 2015-12-18 DIAGNOSIS — M961 Postlaminectomy syndrome, not elsewhere classified: Secondary | ICD-10-CM | POA: Diagnosis not present

## 2015-12-18 DIAGNOSIS — M545 Low back pain: Secondary | ICD-10-CM | POA: Diagnosis not present

## 2015-12-18 DIAGNOSIS — M4806 Spinal stenosis, lumbar region: Secondary | ICD-10-CM | POA: Diagnosis not present

## 2015-12-18 DIAGNOSIS — G894 Chronic pain syndrome: Secondary | ICD-10-CM | POA: Diagnosis not present

## 2015-12-18 DIAGNOSIS — M5137 Other intervertebral disc degeneration, lumbosacral region: Secondary | ICD-10-CM | POA: Diagnosis not present

## 2015-12-19 DIAGNOSIS — G894 Chronic pain syndrome: Secondary | ICD-10-CM | POA: Diagnosis not present

## 2015-12-19 DIAGNOSIS — Z6821 Body mass index (BMI) 21.0-21.9, adult: Secondary | ICD-10-CM | POA: Diagnosis not present

## 2015-12-19 DIAGNOSIS — Z0001 Encounter for general adult medical examination with abnormal findings: Secondary | ICD-10-CM | POA: Diagnosis not present

## 2015-12-19 DIAGNOSIS — N529 Male erectile dysfunction, unspecified: Secondary | ICD-10-CM | POA: Diagnosis not present

## 2015-12-19 DIAGNOSIS — Z1389 Encounter for screening for other disorder: Secondary | ICD-10-CM | POA: Diagnosis not present

## 2015-12-19 DIAGNOSIS — Z125 Encounter for screening for malignant neoplasm of prostate: Secondary | ICD-10-CM | POA: Diagnosis not present

## 2015-12-19 DIAGNOSIS — Z0000 Encounter for general adult medical examination without abnormal findings: Secondary | ICD-10-CM | POA: Diagnosis not present

## 2015-12-19 DIAGNOSIS — M5412 Radiculopathy, cervical region: Secondary | ICD-10-CM | POA: Diagnosis not present

## 2016-01-02 DIAGNOSIS — M5033 Other cervical disc degeneration, cervicothoracic region: Secondary | ICD-10-CM | POA: Diagnosis not present

## 2016-01-02 DIAGNOSIS — G894 Chronic pain syndrome: Secondary | ICD-10-CM | POA: Diagnosis not present

## 2016-01-02 DIAGNOSIS — M542 Cervicalgia: Secondary | ICD-10-CM | POA: Diagnosis not present

## 2016-01-02 DIAGNOSIS — M4802 Spinal stenosis, cervical region: Secondary | ICD-10-CM | POA: Diagnosis not present

## 2016-01-15 DIAGNOSIS — M961 Postlaminectomy syndrome, not elsewhere classified: Secondary | ICD-10-CM | POA: Diagnosis not present

## 2016-01-15 DIAGNOSIS — M545 Low back pain: Secondary | ICD-10-CM | POA: Diagnosis not present

## 2016-01-15 DIAGNOSIS — M4806 Spinal stenosis, lumbar region: Secondary | ICD-10-CM | POA: Diagnosis not present

## 2016-01-15 DIAGNOSIS — M5415 Radiculopathy, thoracolumbar region: Secondary | ICD-10-CM | POA: Diagnosis not present

## 2016-01-15 DIAGNOSIS — G894 Chronic pain syndrome: Secondary | ICD-10-CM | POA: Diagnosis not present

## 2016-01-15 DIAGNOSIS — M5137 Other intervertebral disc degeneration, lumbosacral region: Secondary | ICD-10-CM | POA: Diagnosis not present

## 2016-02-19 IMAGING — MR MR CERVICAL SPINE W/O CM
4 of 5 series · 14 of 48 positions shown · non-contrast
Comparison: Radiographs dated 05/22/2014 and CT scan dated
06/19/2013

CLINICAL DATA: Neck pain with pain and numbness extending into the
right side of the face since a normal [place] accident on
05/22/2014.

EXAM:
MRI CERVICAL SPINE WITHOUT CONTRAST
TECHNIQUE: Multiplanar, multisequence MR imaging of the cervical spine was
performed. No intravenous contrast was administered.

[Series 3: T2 · sagittal · 3.0mm · 0.41mm/px · 5 of 13 slices shown (1 of 2)]
[im 1/13]
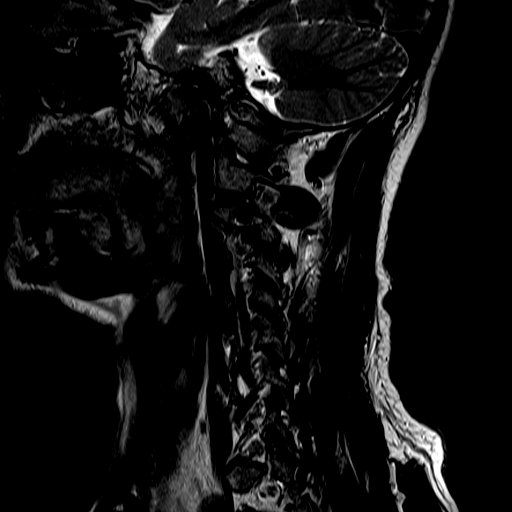
[im 4/13]
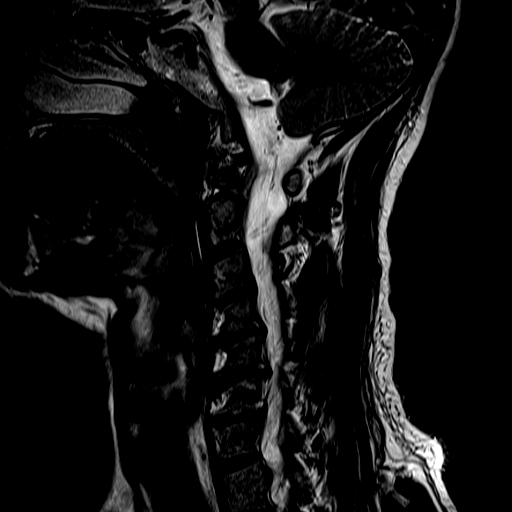
[im 7/13]
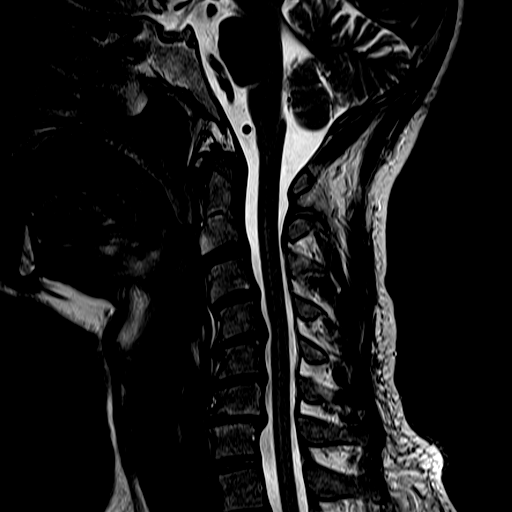
[im 10/13]
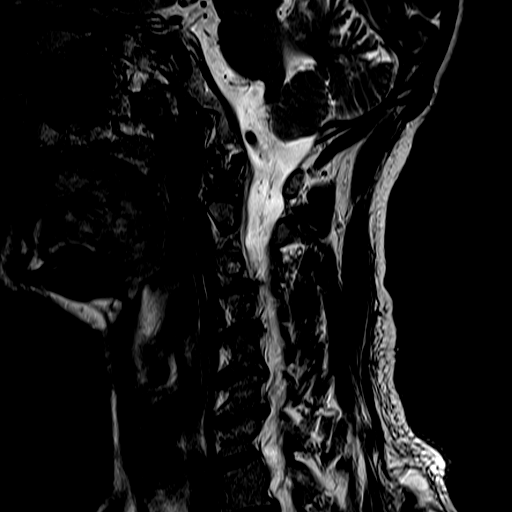
[im 13/13]
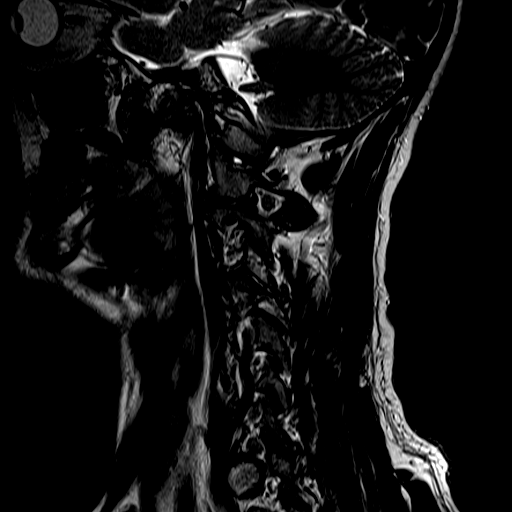

[Series 4: FLAIR · sagittal · 3.0mm · 0.42mm/px · 3 of 13 slices shown]
[im 1/13]
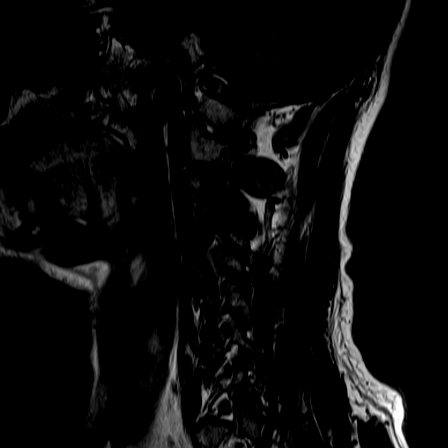
[im 7/13]
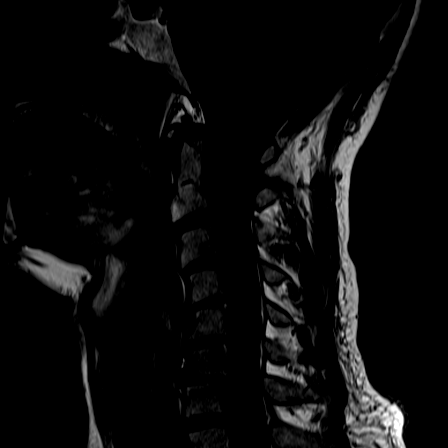
[im 13/13]
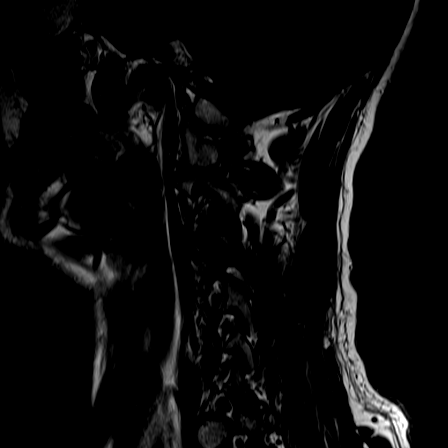

[Series 5: ir sagital · sagittal · 3.0mm · 0.23mm/px · 3 of 13 slices shown]
[im 3/13]
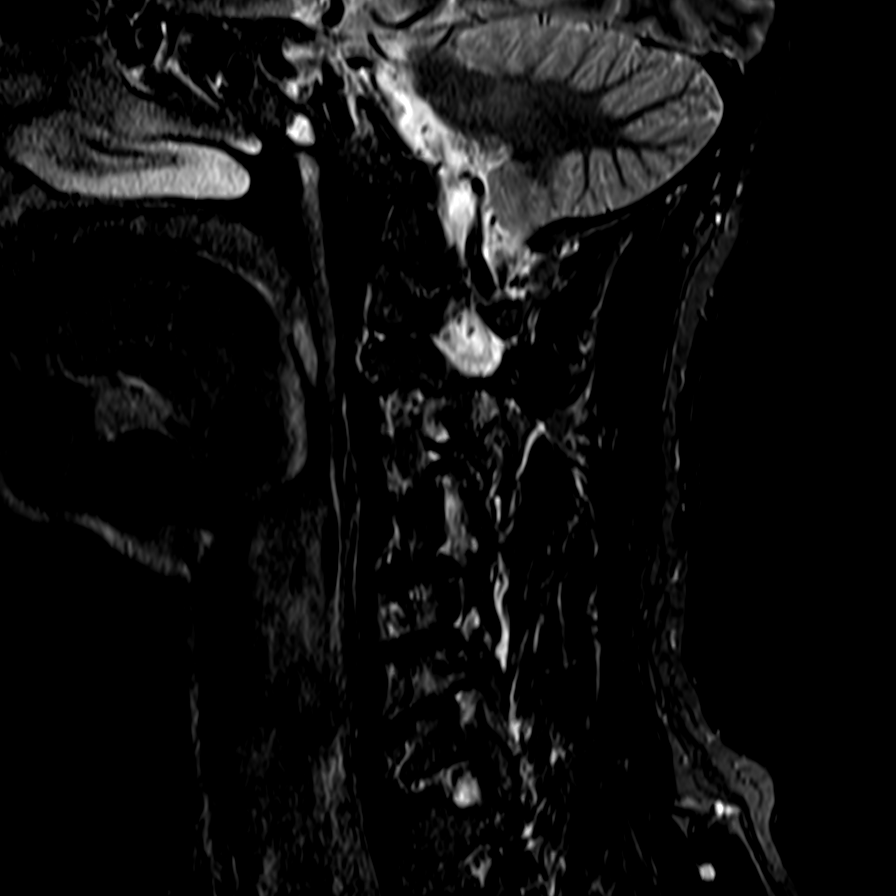
[im 8/13]
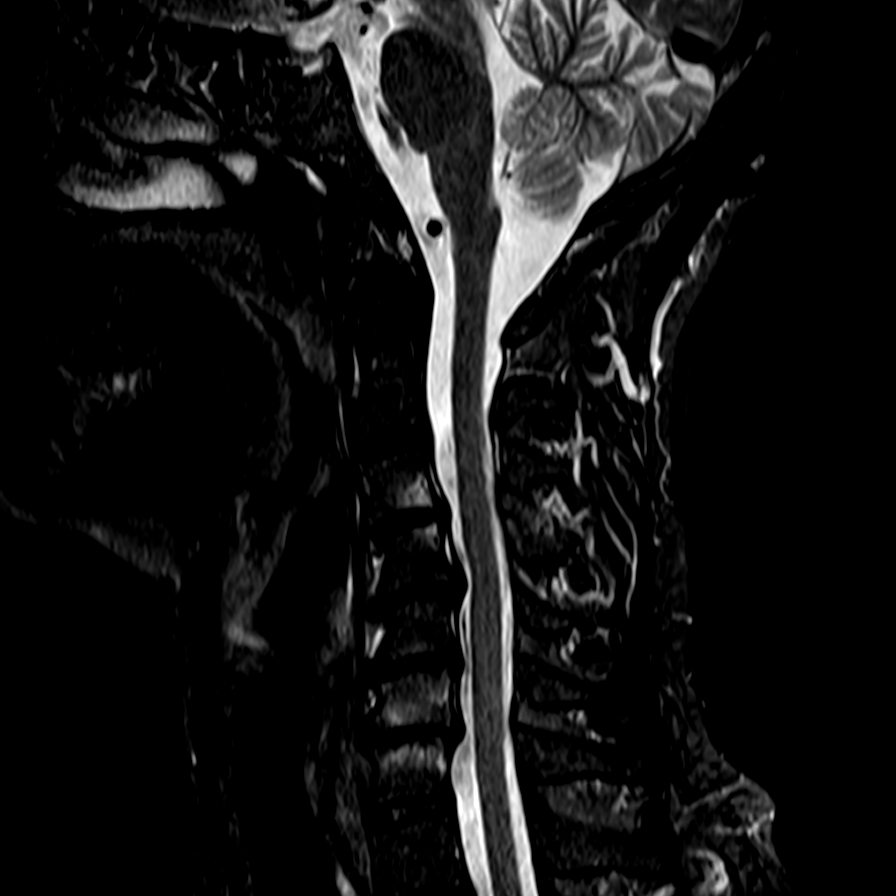
[im 13/13]
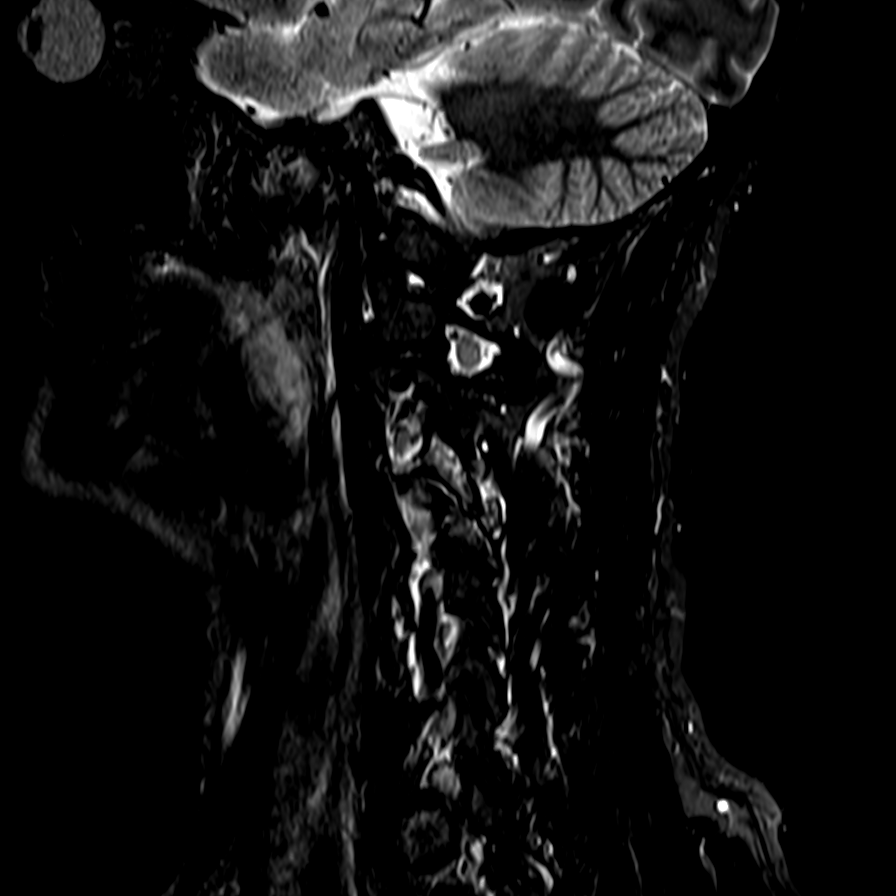

[Series 7: T2 · axial · 3.0mm · 0.20mm/px · z∈[-72,+12]mm · 3 of 36 slices shown (2 of 2)]
[im 5/36]
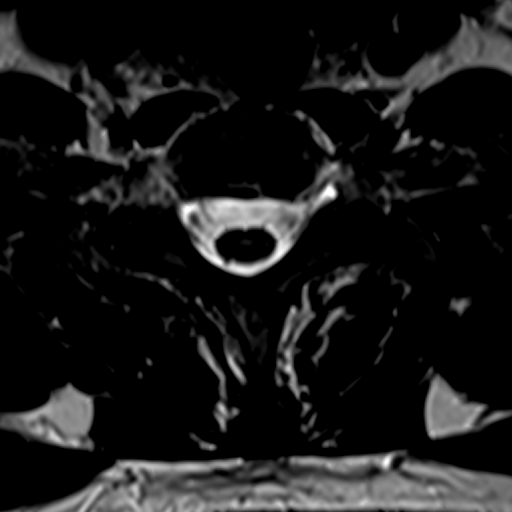
[im 19/36]
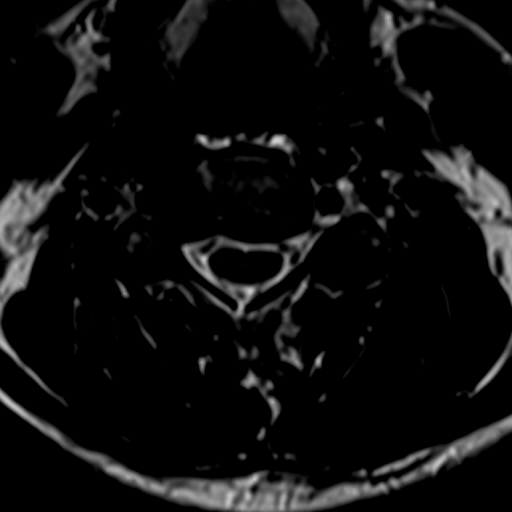
[im 31/36]
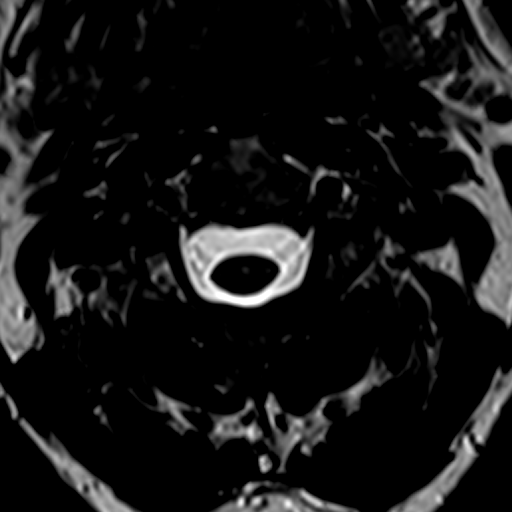

[14 of 48 positions shown; findings below may reference images not displayed]

FINDINGS: The visualized intracranial contents and paraspinal soft tissues are
normal.

Craniocervical junction through C2-3: Moderate right facet arthritis
at C2-3. Otherwise, normal exam.

C3-4: Degenerative disc disease with a small broad-based disc bulge
asymmetric into the left neural foramen with moderate left foraminal
stenosis. Slight right and moderately severe left facet arthritis
with edema in and around that joint.

C4-5: Disc space narrowing. Broad-based disc osteophyte complex.
Moderate right foraminal stenosis. Slight distortion of the anterior
aspect of the spinal cord without myelopathy.

C5-6: Prominent disc osteophyte complex protrudes into the right
lateral recess and right neural foramen and to a lesser degree on
the left with moderate right and slight left foraminal stenosis.

C6-7: Disc disc protrusion with accompanying osteophytes extends
into the left lateral recess and left neural foramen. Small
broad-based bulge of the remainder of the disc across the midline
extending into the right neural foramen. Severe left foraminal
stenosis.

C7-T1: Normal disc. Moderate left and slight right facet arthritis.
IMPRESSION: 1. Diffuse degenerative disc and joint disease. The most severe
facet arthritis is at C2-3 on the right and at C3-4 on the left.
2. Multilevel foraminal stenoses, including severe left foraminal
stenosis at C6-7.

## 2016-03-26 DIAGNOSIS — M3500 Sicca syndrome, unspecified: Secondary | ICD-10-CM | POA: Diagnosis not present

## 2016-03-26 DIAGNOSIS — N400 Enlarged prostate without lower urinary tract symptoms: Secondary | ICD-10-CM | POA: Diagnosis not present

## 2016-03-26 DIAGNOSIS — G894 Chronic pain syndrome: Secondary | ICD-10-CM | POA: Diagnosis not present

## 2016-03-26 DIAGNOSIS — Z6820 Body mass index (BMI) 20.0-20.9, adult: Secondary | ICD-10-CM | POA: Diagnosis not present

## 2016-04-14 DIAGNOSIS — L03119 Cellulitis of unspecified part of limb: Secondary | ICD-10-CM | POA: Diagnosis not present

## 2016-04-14 DIAGNOSIS — Z6821 Body mass index (BMI) 21.0-21.9, adult: Secondary | ICD-10-CM | POA: Diagnosis not present

## 2016-04-14 DIAGNOSIS — W64XXXA Exposure to other animate mechanical forces, initial encounter: Secondary | ICD-10-CM | POA: Diagnosis not present

## 2016-06-26 DIAGNOSIS — G64 Other disorders of peripheral nervous system: Secondary | ICD-10-CM | POA: Diagnosis not present

## 2016-06-26 DIAGNOSIS — G894 Chronic pain syndrome: Secondary | ICD-10-CM | POA: Diagnosis not present

## 2016-06-26 DIAGNOSIS — J449 Chronic obstructive pulmonary disease, unspecified: Secondary | ICD-10-CM | POA: Diagnosis not present

## 2016-06-26 DIAGNOSIS — Z6820 Body mass index (BMI) 20.0-20.9, adult: Secondary | ICD-10-CM | POA: Diagnosis not present

## 2016-09-19 DIAGNOSIS — I861 Scrotal varices: Secondary | ICD-10-CM | POA: Diagnosis not present

## 2016-09-19 DIAGNOSIS — F1120 Opioid dependence, uncomplicated: Secondary | ICD-10-CM | POA: Diagnosis not present

## 2016-09-19 DIAGNOSIS — G894 Chronic pain syndrome: Secondary | ICD-10-CM | POA: Diagnosis not present

## 2016-09-19 DIAGNOSIS — Z6821 Body mass index (BMI) 21.0-21.9, adult: Secondary | ICD-10-CM | POA: Diagnosis not present

## 2016-09-19 DIAGNOSIS — M1991 Primary osteoarthritis, unspecified site: Secondary | ICD-10-CM | POA: Diagnosis not present

## 2016-09-22 ENCOUNTER — Telehealth (HOSPITAL_COMMUNITY): Payer: Self-pay | Admitting: Physical Therapy

## 2016-09-22 NOTE — Telephone Encounter (Signed)
Pt called said he needed to make an appointment to have an ultra-sound. I was trransferring pt to Radiology and he hung up on me.

## 2016-09-23 ENCOUNTER — Other Ambulatory Visit (HOSPITAL_COMMUNITY): Payer: Self-pay | Admitting: Internal Medicine

## 2016-09-23 ENCOUNTER — Other Ambulatory Visit: Payer: Self-pay | Admitting: Internal Medicine

## 2016-09-23 DIAGNOSIS — N50819 Testicular pain, unspecified: Secondary | ICD-10-CM

## 2016-09-26 ENCOUNTER — Ambulatory Visit (HOSPITAL_COMMUNITY)
Admission: RE | Admit: 2016-09-26 | Discharge: 2016-09-26 | Disposition: A | Discharge status: Home / Self Care | Payer: Commercial Managed Care - HMO | Source: Ambulatory Visit | Attending: Internal Medicine | Admitting: Internal Medicine

## 2016-09-26 DIAGNOSIS — I861 Scrotal varices: Secondary | ICD-10-CM | POA: Diagnosis not present

## 2016-09-26 DIAGNOSIS — N50819 Testicular pain, unspecified: Secondary | ICD-10-CM | POA: Diagnosis not present

## 2016-09-26 DIAGNOSIS — N503 Cyst of epididymis: Secondary | ICD-10-CM | POA: Diagnosis not present

## 2016-09-26 DIAGNOSIS — N5089 Other specified disorders of the male genital organs: Secondary | ICD-10-CM | POA: Diagnosis not present

## 2016-11-05 ENCOUNTER — Ambulatory Visit: Payer: Medicare Other | Admitting: Urology

## 2016-11-17 DIAGNOSIS — F419 Anxiety disorder, unspecified: Secondary | ICD-10-CM | POA: Diagnosis not present

## 2016-11-17 DIAGNOSIS — G894 Chronic pain syndrome: Secondary | ICD-10-CM | POA: Diagnosis not present

## 2016-11-17 DIAGNOSIS — Z6820 Body mass index (BMI) 20.0-20.9, adult: Secondary | ICD-10-CM | POA: Diagnosis not present

## 2016-11-17 DIAGNOSIS — N529 Male erectile dysfunction, unspecified: Secondary | ICD-10-CM | POA: Diagnosis not present

## 2016-11-17 DIAGNOSIS — F1120 Opioid dependence, uncomplicated: Secondary | ICD-10-CM | POA: Diagnosis not present

## 2016-11-17 DIAGNOSIS — Z1389 Encounter for screening for other disorder: Secondary | ICD-10-CM | POA: Diagnosis not present

## 2016-11-28 ENCOUNTER — Emergency Department (HOSPITAL_COMMUNITY): Payer: Medicare HMO

## 2016-11-28 ENCOUNTER — Encounter (HOSPITAL_COMMUNITY): Payer: Self-pay

## 2016-11-28 ENCOUNTER — Emergency Department (HOSPITAL_COMMUNITY)
Admission: EM | Admit: 2016-11-28 | Discharge: 2016-11-28 | Disposition: A | Discharge status: Home / Self Care | Payer: Medicare HMO | Attending: Emergency Medicine | Admitting: Emergency Medicine

## 2016-11-28 DIAGNOSIS — Z79899 Other long term (current) drug therapy: Secondary | ICD-10-CM | POA: Diagnosis not present

## 2016-11-28 DIAGNOSIS — R1032 Left lower quadrant pain: Secondary | ICD-10-CM | POA: Diagnosis not present

## 2016-11-28 DIAGNOSIS — I861 Scrotal varices: Secondary | ICD-10-CM | POA: Insufficient documentation

## 2016-11-28 DIAGNOSIS — F17210 Nicotine dependence, cigarettes, uncomplicated: Secondary | ICD-10-CM | POA: Diagnosis not present

## 2016-11-28 DIAGNOSIS — R109 Unspecified abdominal pain: Secondary | ICD-10-CM | POA: Diagnosis not present

## 2016-11-28 DIAGNOSIS — N50812 Left testicular pain: Secondary | ICD-10-CM | POA: Diagnosis not present

## 2016-11-28 DIAGNOSIS — N200 Calculus of kidney: Secondary | ICD-10-CM | POA: Diagnosis not present

## 2016-11-28 DIAGNOSIS — N50819 Testicular pain, unspecified: Secondary | ICD-10-CM

## 2016-11-28 LAB — URINALYSIS, ROUTINE W REFLEX MICROSCOPIC
Bilirubin Urine: NEGATIVE
Glucose, UA: NEGATIVE mg/dL
HGB URINE DIPSTICK: NEGATIVE
Ketones, ur: NEGATIVE mg/dL
LEUKOCYTES UA: NEGATIVE
Nitrite: NEGATIVE
Protein, ur: NEGATIVE mg/dL
Specific Gravity, Urine: 1.002 — ABNORMAL LOW (ref 1.005–1.030)
pH: 7 (ref 5.0–8.0)

## 2016-11-28 LAB — COMPREHENSIVE METABOLIC PANEL
ALT: 12 U/L — ABNORMAL LOW (ref 17–63)
ANION GAP: 6 (ref 5–15)
AST: 15 U/L (ref 15–41)
Albumin: 3.4 g/dL — ABNORMAL LOW (ref 3.5–5.0)
Alkaline Phosphatase: 85 U/L (ref 38–126)
BILIRUBIN TOTAL: 0.2 mg/dL — AB (ref 0.3–1.2)
BUN: 12 mg/dL (ref 6–20)
CO2: 30 mmol/L (ref 22–32)
Calcium: 8 mg/dL — ABNORMAL LOW (ref 8.9–10.3)
Chloride: 104 mmol/L (ref 101–111)
Creatinine, Ser: 0.86 mg/dL (ref 0.61–1.24)
GFR calc Af Amer: >60 mL/min (ref >60)
Glucose, Bld: 101 mg/dL — ABNORMAL HIGH (ref 65–99)
POTASSIUM: 3.2 mmol/L — AB (ref 3.5–5.1)
Sodium: 140 mmol/L (ref 135–145)
TOTAL PROTEIN: 6 g/dL — AB (ref 6.5–8.1)

## 2016-11-28 LAB — CBC WITH DIFFERENTIAL/PLATELET
BASOS ABS: 0 10*3/uL (ref 0.0–0.1)
Basophils Relative: 1 %
EOS PCT: 2 %
Eosinophils Absolute: 0.2 10*3/uL (ref 0.0–0.7)
HCT: 39.3 % (ref 39.0–52.0)
Hemoglobin: 12.9 g/dL — ABNORMAL LOW (ref 13.0–17.0)
LYMPHS PCT: 29 %
Lymphs Abs: 2 10*3/uL (ref 0.7–4.0)
MCH: 29.9 pg (ref 26.0–34.0)
MCHC: 32.8 g/dL (ref 30.0–36.0)
MCV: 91 fL (ref 78.0–100.0)
Monocytes Absolute: 0.7 10*3/uL (ref 0.1–1.0)
Monocytes Relative: 10 %
Neutro Abs: 4.1 10*3/uL (ref 1.7–7.7)
Neutrophils Relative %: 58 %
PLATELETS: 255 10*3/uL (ref 150–400)
RBC: 4.32 MIL/uL (ref 4.22–5.81)
RDW: 13.4 % (ref 11.5–15.5)
WBC: 7 10*3/uL (ref 4.0–10.5)

## 2016-11-28 LAB — LIPASE, BLOOD: Lipase: 16 U/L (ref 11–51)

## 2016-11-28 MED ORDER — DICYCLOMINE HCL 20 MG PO TABS: 20.0000 mg | ORAL_TABLET | Freq: Four times a day (QID) | ORAL | 0 refills | Status: DC | PRN

## 2016-11-28 MED ORDER — MORPHINE SULFATE (PF) 4 MG/ML IV SOLN
4.0000 mg | Freq: Once | INTRAVENOUS | Status: AC
  Administered 2016-11-28: 4 mg via INTRAMUSCULAR
  Filled 2016-11-28: qty 1

## 2016-11-28 MED ORDER — DICYCLOMINE HCL 10 MG PO CAPS
20.0000 mg | ORAL_CAPSULE | Freq: Once | ORAL | Status: AC
  Administered 2016-11-28: 20 mg via ORAL
  Filled 2016-11-28: qty 2

## 2016-11-28 NOTE — ED Notes (Signed)
Pt requesting another dose of pain medication, states first dose did not help.  Dr. Clarene DukeMcManus notified that pt is wanting another dose.  Pt has been offered a dose of oral bentyl.

## 2016-11-28 NOTE — ED Notes (Signed)
Pt given referral to dr. Jena Gaussourk as requested.

## 2016-11-28 NOTE — Discharge Instructions (Addendum)
Take the prescription as directed.  Call your regular medical doctor, the GI doctor and the Urologist today to schedule a follow up appointment next week.  Return to the Emergency Department immediately sooner if worsening.

## 2016-11-28 NOTE — ED Notes (Signed)
Pt made aware to return if symptoms worsen or if any life threatening symptoms occur.   

## 2016-11-28 NOTE — ED Triage Notes (Signed)
Pt reports LLQ pain that has been intermittent for 4-5 days. Pain 6/10 at present, but at worst is 10 and stabbing. Normal BM this am. No N/V/D. Reports left testicular pain. No urinary symptoms

## 2016-11-28 NOTE — ED Notes (Signed)
Pt's visitor came out requesting more pain medication for pt.

## 2016-11-28 NOTE — ED Provider Notes (Signed)
[facility] Provider Note   CSN: [id number] Arrival date & time: 11/28/16  0931     History   Chief Complaint Chief Complaint  Patient presents with  . Abdominal Pain    HPI Wayne Rowe is a 56 y.o. male.  HPI  Pt was seen at 1010.  Per pt, c/o gradual onset and persistence of intermittent LLQ abd "pain" for the past 5 to 6 days.  Has been associated with radiation into his left testicle. Endorses hx of chronic testicular pain, which has been evaluated by his PMD. Describes the abd pain as "sharp."  Denies N/V, no diarrhea, no fevers, no back pain, no rash, no CP/SOB, no black or blood in stools, no dysuria/hematuria.      Past Medical History:  Diagnosis Date  . Anxiety   . Arthritis   . Back pain   . DDD (degenerative disc disease)     There are no active problems to display for this patient.   Past Surgical History:  Procedure Laterality Date  . BACK SURGERY         Home Medications    Prior to Admission medications   Medication Sig Start Date End Date Taking? Authorizing Provider  carisoprodol (SOMA) 350 MG tablet Take 350 mg by mouth 4 (four) times daily as needed for muscle spasms.   Yes Historical Provider, MD  celecoxib (CELEBREX) 200 MG capsule Take 200 mg by mouth daily.   Yes Historical Provider, MD  diazepam (VALIUM) 10 MG tablet Take 10 mg by mouth 4 (four) times daily.    Yes Historical Provider, MD  HYDROcodone-acetaminophen (NORCO) 10-325 MG per tablet Take 1 tablet by mouth every 6 (six) hours as needed for pain.   Yes Historical Provider, MD  Polyvinyl Alcohol-Povidone (REFRESH OP) Place 1 drop into both eyes 3 (three) times daily.   Yes Historical Provider, MD  pregabalin (LYRICA) 200 MG capsule Take 200 mg by mouth 3 (three) times daily.   Yes Historical Provider, MD  tamsulosin (FLOMAX) 0.4 MG CAPS capsule Take 0.4 mg by mouth daily. 04/12/15  Yes Historical Provider, MD    Family History   Social History Social History    Substance Use Topics  . Smoking status: Current Every Day Smoker    Packs/day: 1.00    Types: Cigarettes  . Smokeless tobacco: Never Used  . Alcohol use No     Allergies   Patient has no known allergies.   Review of Systems Review of Systems ROS: Statement: All systems negative except as marked or noted in the HPI; Constitutional: Negative for fever and chills. ; ; Eyes: Negative for eye pain, redness and discharge. ; ; ENMT: Negative for ear pain, hoarseness, nasal congestion, sinus pressure and sore throat. ; ; Cardiovascular: Negative for chest pain, palpitations, diaphoresis, dyspnea and peripheral edema. ; ; Respiratory: Negative for cough, wheezing and stridor. ; ; Gastrointestinal: +abd pain. Negative for nausea, vomiting, diarrhea, blood in stool, hematemesis, jaundice and rectal bleeding. . ; ; Genitourinary: Negative for dysuria, flank pain and hematuria. ; ; Genital:  No penile drainage or rash, no testicular swelling, no scrotal rash or swelling. ;; Musculoskeletal: Negative for back pain and neck pain. Negative for swelling and trauma.; ; Skin: Negative for pruritus, rash, abrasions, blisters, bruising and skin lesion.; ; Neuro: Negative for headache, lightheadedness and neck stiffness. Negative for weakness, altered level of consciousness, altered mental status, extremity weakness, paresthesias, involuntary movement, seizure and syncope.      Physical  Exam Updated Vital Signs BP 106/66   Pulse 72   Temp 97.5 F (36.4 C) (Oral)   Resp 18   Ht 6\' 1"  (1.854 m)   Wt 140 lb (63.5 kg)   SpO2 94%   BMI 18.47 kg/m   Physical Exam 1015: Physical examination:  Nursing notes reviewed; Vital signs and O2 SAT reviewed;  Constitutional: Well developed, Well nourished, Well hydrated, In no acute distress; Head:  Normocephalic, atraumatic; Eyes: EOMI, PERRL, No scleral icterus; ENMT: Mouth and pharynx normal, Mucous membranes moist; Neck: Supple, Full range of motion, No  lymphadenopathy; Cardiovascular: Regular rate and rhythm, No gallop; Respiratory: Breath sounds clear & equal bilaterally, No wheezes.  Speaking full sentences with ease, Normal respiratory effort/excursion; Chest: Nontender, Movement normal; Abdomen: Soft, Nontender, Nondistended, Normal bowel sounds; Genitourinary: No CVA tenderness; Extremities: Pulses normal, No tenderness, No edema, No calf edema or asymmetry.; Neuro: AA&Ox3, Major CN grossly intact.  Speech clear. No gross focal motor or sensory deficits in extremities.; Skin: Color normal, Warm, Dry.   ED Treatments / Results  Labs (all labs ordered are listed, but only abnormal results are displayed)   EKG  EKG Interpretation None       Radiology   Procedures Procedures (including critical care time)  Medications Ordered in ED Medications  morphine 4 MG/ML injection 4 mg (4 mg Intramuscular Given 11/28/16 1151)     Initial Impression / Assessment and Plan / ED Course  I have reviewed the triage vital signs and the nursing notes.  Pertinent labs & imaging results that were available during my care of the patient were reviewed by me and considered in my medical decision making (see chart for details).  MDM Reviewed: previous chart, nursing note and vitals Reviewed previous: labs Interpretation: labs, ultrasound and CT scan   Results for orders placed or performed during the hospital encounter of 11/28/16  Lipase, blood  Result Value Ref Range   Lipase 16 11 - 51 U/L  Comprehensive metabolic panel  Result Value Ref Range   Sodium 140 135 - 145 mmol/L   Potassium 3.2 (L) 3.5 - 5.1 mmol/L   Chloride 104 101 - 111 mmol/L   CO2 30 22 - 32 mmol/L   Glucose, Bld 101 (H) 65 - 99 mg/dL   BUN 12 6 - 20 mg/dL   Creatinine, Ser 0.98 0.61 - 1.24 mg/dL   Calcium 8.0 (L) 8.9 - 10.3 mg/dL   Total Protein 6.0 (L) 6.5 - 8.1 g/dL   Albumin 3.4 (L) 3.5 - 5.0 g/dL   AST 15 15 - 41 U/L   ALT 12 (L) 17 - 63 U/L   Alkaline  Phosphatase 85 38 - 126 U/L   Total Bilirubin 0.2 (L) 0.3 - 1.2 mg/dL   GFR calc non Af Amer >60 >60 mL/min   GFR calc Af Amer >60 >60 mL/min   Anion gap 6 5 - 15  Urinalysis, Routine w reflex microscopic  Result Value Ref Range   Color, Urine COLORLESS (A) YELLOW   APPearance CLEAR CLEAR   Specific Gravity, Urine 1.002 (L) 1.005 - 1.030   pH 7.0 5.0 - 8.0   Glucose, UA NEGATIVE NEGATIVE mg/dL   Hgb urine dipstick NEGATIVE NEGATIVE   Bilirubin Urine NEGATIVE NEGATIVE   Ketones, ur NEGATIVE NEGATIVE mg/dL   Protein, ur NEGATIVE NEGATIVE mg/dL   Nitrite NEGATIVE NEGATIVE   Leukocytes, UA NEGATIVE NEGATIVE  CBC with Differential  Result Value Ref Range   WBC 7.0 4.0 -  10.5 K/uL   RBC 4.32 4.22 - 5.81 MIL/uL   Hemoglobin 12.9 (L) 13.0 - 17.0 g/dL   HCT 16.1 09.6 - 04.5 %   MCV 91.0 78.0 - 100.0 fL   MCH 29.9 26.0 - 34.0 pg   MCHC 32.8 30.0 - 36.0 g/dL   RDW 40.9 81.1 - 91.4 %   Platelets 255 150 - 400 K/uL   Neutrophils Relative % 58 %   Neutro Abs 4.1 1.7 - 7.7 K/uL   Lymphocytes Relative 29 %   Lymphs Abs 2.0 0.7 - 4.0 K/uL   Monocytes Relative 10 %   Monocytes Absolute 0.7 0.1 - 1.0 K/uL   Eosinophils Relative 2 %   Eosinophils Absolute 0.2 0.0 - 0.7 K/uL   Basophils Relative 1 %   Basophils Absolute 0.0 0.0 - 0.1 K/uL   [place] Art/ven Flow Abd Pelv Doppler Result Date: 11/28/2016 CLINICAL DATA:  Chronic right scrotal pain. New onset left lower quadrant and scrotal pain over the last 4-5 days. EXAM: SCROTAL ULTRASOUND DOPPLER ULTRASOUND OF THE TESTICLES TECHNIQUE: Complete ultrasound examination of the testicles, epididymis, and other scrotal structures was performed. Color and spectral Doppler ultrasound were also utilized to evaluate blood flow to the testicles. COMPARISON:  None. Pelvic ultrasound 09/26/2016. FINDINGS: Measurements: 6.1 x 2.3 x 3.6 cm, within normal limits. No mass or microlithiasis visualized. Left testicle Measurements: 5.7 x 2.8 x 2.9 cm, within normal  limits. No mass or microlithiasis visualized. Right epididymis:  Normal in size and appearance. Left epididymis:  A 1.5 cm left epididymal cyst is stable. Hydrocele:  A small left hydrocele is present. Varicocele:  Left-sided varicocele is stable. Pulsed Doppler interrogation of both testes demonstrates normal low resistance arterial and venous waveforms bilaterally. IMPRESSION: 1. No acute abnormality or significant interval change. 2. Stable left-sided varicocele. 3. Stable left epididymal cyst. 4. Normal appearance of the testicles and normal pulsed Doppler interrogation. Electronically Signed   By: Marin Roberts M.D.   On: 11/28/2016 11:44   Ct Renal Stone Study Result Date: 11/28/2016 CLINICAL DATA:  Several days of left lower quadrant abdominal pain. EXAM: CT ABDOMEN AND PELVIS WITHOUT CONTRAST TECHNIQUE: Multidetector CT imaging of the abdomen and pelvis was performed following the standard protocol without IV contrast. COMPARISON:  None. FINDINGS: Lower chest: No significant pulmonary nodules or acute consolidative airspace disease. Hepatobiliary: Normal liver with no liver mass. Normal gallbladder with no radiopaque cholelithiasis. No biliary ductal dilatation. Pancreas: Normal, with no mass or duct dilation. Spleen: Normal size. No mass. Adrenals/Urinary Tract: Normal adrenals. Nonobstructing 2 mm upper right renal stone. No additional renal stones. No hydronephrosis. No contour deforming renal masses. Normal caliber ureters, with no ureteral stones. Normal bladder with no definite bladder wall thickening or bladder stones. Stomach/Bowel: Grossly normal stomach. Normal caliber small bowel with no small bowel wall thickening. Appendix not discretely visualized. No pericecal inflammatory changes. Normal large bowel with no diverticulosis, large bowel wall thickening or pericolonic fat stranding. Vascular/Lymphatic: Atherosclerotic nonaneurysmal abdominal aorta. No pathologically enlarged lymph nodes  in the abdomen or pelvis. Reproductive: Normal size prostate. Other: No pneumoperitoneum, ascites or focal fluid collection. Musculoskeletal: No aggressive appearing focal osseous lesions. Marked thoracolumbar spondylosis. IMPRESSION: 1. No acute abnormality. No evidence of bowel obstruction or acute bowel inflammation. 2. Nonobstructing punctate right renal stone. No hydronephrosis. No ureteral or bladder stones. 3. Aortic atherosclerosis. Electronically Signed   By: Delbert Phenix M.D.   On: 11/28/2016 12:01    1300:  Workup reassuring. Tx symptomatically, f/u  PMD. Dx and testing d/w pt and family.  Questions answered.  Verb understanding, agreeable to d/c home with outpt f/u.    Final Clinical Impressions(s) / ED Diagnoses   Final diagnoses:  Testicle pain    New Prescriptions New Prescriptions   No medications on file     Samuel Jester, DO 12/02/16 1610

## 2016-12-03 DIAGNOSIS — Z681 Body mass index (BMI) 19 or less, adult: Secondary | ICD-10-CM | POA: Diagnosis not present

## 2016-12-03 DIAGNOSIS — Z1389 Encounter for screening for other disorder: Secondary | ICD-10-CM | POA: Diagnosis not present

## 2016-12-03 DIAGNOSIS — F1120 Opioid dependence, uncomplicated: Secondary | ICD-10-CM | POA: Diagnosis not present

## 2016-12-03 DIAGNOSIS — M1991 Primary osteoarthritis, unspecified site: Secondary | ICD-10-CM | POA: Diagnosis not present

## 2016-12-03 DIAGNOSIS — G894 Chronic pain syndrome: Secondary | ICD-10-CM | POA: Diagnosis not present

## 2016-12-03 DIAGNOSIS — N5082 Scrotal pain: Secondary | ICD-10-CM | POA: Diagnosis not present

## 2016-12-03 DIAGNOSIS — N451 Epididymitis: Secondary | ICD-10-CM | POA: Diagnosis not present

## 2016-12-10 DIAGNOSIS — R109 Unspecified abdominal pain: Secondary | ICD-10-CM | POA: Diagnosis not present

## 2016-12-10 DIAGNOSIS — R351 Nocturia: Secondary | ICD-10-CM | POA: Diagnosis not present

## 2016-12-10 DIAGNOSIS — N509 Disorder of male genital organs, unspecified: Secondary | ICD-10-CM | POA: Diagnosis not present

## 2016-12-10 DIAGNOSIS — R350 Frequency of micturition: Secondary | ICD-10-CM | POA: Diagnosis not present

## 2016-12-10 DIAGNOSIS — N401 Enlarged prostate with lower urinary tract symptoms: Secondary | ICD-10-CM | POA: Diagnosis not present

## 2016-12-26 DIAGNOSIS — N50811 Right testicular pain: Secondary | ICD-10-CM | POA: Diagnosis not present

## 2016-12-26 DIAGNOSIS — R3915 Urgency of urination: Secondary | ICD-10-CM | POA: Diagnosis not present

## 2017-01-22 DIAGNOSIS — N509 Disorder of male genital organs, unspecified: Secondary | ICD-10-CM | POA: Diagnosis not present

## 2017-02-12 DIAGNOSIS — J449 Chronic obstructive pulmonary disease, unspecified: Secondary | ICD-10-CM | POA: Diagnosis not present

## 2017-02-12 DIAGNOSIS — M1991 Primary osteoarthritis, unspecified site: Secondary | ICD-10-CM | POA: Diagnosis not present

## 2017-02-12 DIAGNOSIS — Z681 Body mass index (BMI) 19 or less, adult: Secondary | ICD-10-CM | POA: Diagnosis not present

## 2017-02-12 DIAGNOSIS — G894 Chronic pain syndrome: Secondary | ICD-10-CM | POA: Diagnosis not present

## 2017-02-19 DIAGNOSIS — N509 Disorder of male genital organs, unspecified: Secondary | ICD-10-CM | POA: Diagnosis not present

## 2017-05-01 DIAGNOSIS — M1991 Primary osteoarthritis, unspecified site: Secondary | ICD-10-CM | POA: Diagnosis not present

## 2017-05-01 DIAGNOSIS — F1120 Opioid dependence, uncomplicated: Secondary | ICD-10-CM | POA: Diagnosis not present

## 2017-05-01 DIAGNOSIS — N509 Disorder of male genital organs, unspecified: Secondary | ICD-10-CM | POA: Diagnosis not present

## 2017-05-01 DIAGNOSIS — Z681 Body mass index (BMI) 19 or less, adult: Secondary | ICD-10-CM | POA: Diagnosis not present

## 2017-05-01 DIAGNOSIS — G894 Chronic pain syndrome: Secondary | ICD-10-CM | POA: Diagnosis not present

## 2017-05-01 DIAGNOSIS — Z1389 Encounter for screening for other disorder: Secondary | ICD-10-CM | POA: Diagnosis not present

## 2017-05-14 ENCOUNTER — Emergency Department (HOSPITAL_COMMUNITY)
Admission: EM | Admit: 2017-05-14 | Discharge: 2017-05-14 | Disposition: A | Discharge status: Home / Self Care | Payer: Medicare HMO | Attending: Emergency Medicine | Admitting: Emergency Medicine

## 2017-05-14 ENCOUNTER — Encounter (HOSPITAL_COMMUNITY): Payer: Self-pay | Admitting: Emergency Medicine

## 2017-05-14 DIAGNOSIS — S0185XA Open bite of other part of head, initial encounter: Secondary | ICD-10-CM | POA: Diagnosis not present

## 2017-05-14 DIAGNOSIS — Y929 Unspecified place or not applicable: Secondary | ICD-10-CM | POA: Insufficient documentation

## 2017-05-14 DIAGNOSIS — Y999 Unspecified external cause status: Secondary | ICD-10-CM | POA: Insufficient documentation

## 2017-05-14 DIAGNOSIS — S0993XA Unspecified injury of face, initial encounter: Secondary | ICD-10-CM | POA: Diagnosis present

## 2017-05-14 DIAGNOSIS — Z23 Encounter for immunization: Secondary | ICD-10-CM | POA: Insufficient documentation

## 2017-05-14 DIAGNOSIS — Z79899 Other long term (current) drug therapy: Secondary | ICD-10-CM | POA: Diagnosis not present

## 2017-05-14 DIAGNOSIS — Y939 Activity, unspecified: Secondary | ICD-10-CM | POA: Diagnosis not present

## 2017-05-14 DIAGNOSIS — F17210 Nicotine dependence, cigarettes, uncomplicated: Secondary | ICD-10-CM | POA: Diagnosis not present

## 2017-05-14 DIAGNOSIS — W540XXA Bitten by dog, initial encounter: Secondary | ICD-10-CM | POA: Diagnosis not present

## 2017-05-14 MED ORDER — TETANUS-DIPHTH-ACELL PERTUSSIS 5-2.5-18.5 LF-MCG/0.5 IM SUSP: 0.5000 mL | Freq: Once | INTRAMUSCULAR | Status: DC

## 2017-05-14 MED ORDER — LIDOCAINE HCL (PF) 1 % IJ SOLN
INTRAMUSCULAR | Status: AC
  Filled 2017-05-14: qty 10

## 2017-05-14 MED ORDER — LIDOCAINE HCL (PF) 2 % IJ SOLN
INTRAMUSCULAR | Status: AC
  Filled 2017-05-14: qty 10

## 2017-05-14 MED ORDER — LIDOCAINE-EPINEPHRINE-TETRACAINE (LET) SOLUTION
3.0000 mL | Freq: Once | NASAL | Status: AC
  Administered 2017-05-14: 3 mL via TOPICAL

## 2017-05-14 MED ORDER — POVIDONE-IODINE 10 % OINT PACKET: TOPICAL_OINTMENT | Freq: Once | CUTANEOUS | Status: DC

## 2017-05-14 MED ORDER — LIDOCAINE-EPINEPHRINE-TETRACAINE (LET) SOLUTION
NASAL | Status: AC
  Administered 2017-05-14: 3 mL via TOPICAL
  Filled 2017-05-14: qty 3

## 2017-05-14 MED ORDER — OXYCODONE-ACETAMINOPHEN 5-325 MG PO TABS
1.0000 | ORAL_TABLET | Freq: Once | ORAL | Status: AC
  Administered 2017-05-14: 1 via ORAL
  Filled 2017-05-14: qty 1

## 2017-05-14 MED ORDER — LIDOCAINE HCL (PF) 1 % IJ SOLN
10.0000 mL | Freq: Once | INTRAMUSCULAR | Status: AC
  Administered 2017-05-14: 10 mL via INTRADERMAL

## 2017-05-14 MED ORDER — TETANUS-DIPHTH-ACELL PERTUSSIS 5-2.5-18.5 LF-MCG/0.5 IM SUSP
INTRAMUSCULAR | Status: AC
  Filled 2017-05-14: qty 0.5

## 2017-05-14 MED ORDER — TETANUS-DIPHTH-ACELL PERTUSSIS 5-2.5-18.5 LF-MCG/0.5 IM SUSP
0.5000 mL | Freq: Once | INTRAMUSCULAR | Status: AC
  Administered 2017-05-14: 0.5 mL via INTRAMUSCULAR

## 2017-05-14 MED ORDER — OXYCODONE-ACETAMINOPHEN 5-325 MG PO TABS: 1.0000 | ORAL_TABLET | ORAL | 0 refills | Status: DC | PRN

## 2017-05-14 MED ORDER — POVIDONE-IODINE 10 % EX SOLN
CUTANEOUS | Status: AC
  Administered 2017-05-14: 1
  Filled 2017-05-14: qty 15

## 2017-05-14 MED ORDER — AMOXICILLIN-POT CLAVULANATE 875-125 MG PO TABS: 1.0000 | ORAL_TABLET | Freq: Two times a day (BID) | ORAL | 0 refills | Status: DC

## 2017-05-14 MED ORDER — AMOXICILLIN-POT CLAVULANATE 875-125 MG PO TABS
1.0000 | ORAL_TABLET | Freq: Once | ORAL | Status: AC
  Administered 2017-05-14: 1 via ORAL
  Filled 2017-05-14: qty 1

## 2017-05-14 NOTE — Discharge Instructions (Signed)
Clean the area gently with mid soap and water.  Soft foods and liquids until the wounds heal.  Return here in 2 days for recheck, sutures out in 7 days.

## 2017-05-14 NOTE — ED Notes (Signed)
[place] [employer] [employer] [place]  Officer to speak with Pt.

## 2017-05-14 NOTE — ED Notes (Signed)
Sheriff in to evaluate pt.

## 2017-05-14 NOTE — ED Provider Notes (Signed)
[facility] Provider Note   CSN: [id number] Arrival date & time: 05/14/17  1126     History   Chief Complaint Chief Complaint  Patient presents with  . Animal Bite    HPI Wayne Rowe is a 56 y.o. male.  HPI  Wayne Rowe is a 56 y.o. male who presents to the Emergency Department complaining of dog bites to the lower face.  He states that he was bitten by his own dog.  Dog's vaccinations are current.  He also describes pain and laceration to the inside of the lower lip.  Minimal bleeding.  Denies numbness.  Last Td is unknown.    Past Medical History:  Diagnosis Date  . Anxiety   . Arthritis   . Back pain   . DDD (degenerative disc disease)     There are no active problems to display for this patient.   Past Surgical History:  Procedure Laterality Date  . BACK SURGERY         Home Medications    Prior to Admission medications   Medication Sig Start Date End Date Taking? Authorizing Provider  carisoprodol (SOMA) 350 MG tablet Take 350 mg by mouth 4 (four) times daily as needed for muscle spasms.    [provider]  celecoxib (CELEBREX) 200 MG capsule Take 200 mg by mouth daily.    [provider]  diazepam (VALIUM) 10 MG tablet Take 10 mg by mouth 4 (four) times daily.     [provider]  dicyclomine (BENTYL) 20 MG tablet Take 1 tablet (20 mg total) by mouth every 6 (six) hours as needed for spasms (abdominal cramping). 11/28/16   Samuel Jester, DO  HYDROcodone-acetaminophen (NORCO) 10-325 MG per tablet Take 1 tablet by mouth every 6 (six) hours as needed for pain.    [provider]  Polyvinyl Alcohol-Povidone (REFRESH OP) Place 1 drop into both eyes 3 (three) times daily.    [provider]  pregabalin (LYRICA) 200 MG capsule Take 200 mg by mouth 3 (three) times daily.    [provider]  tamsulosin (FLOMAX) 0.4 MG CAPS capsule Take 0.4 mg by mouth daily. 04/12/15   [provider]     Family History No family history on file.  Social History Social History  Substance Use Topics  . Smoking status: Current Every Day Smoker    Packs/day: 1.00    Types: Cigarettes  . Smokeless tobacco: Never Used  . Alcohol use No     Allergies   Patient has no known allergies.   Review of Systems Review of Systems  Constitutional: Negative for chills and fever.  HENT: Negative for congestion, dental problem and trouble swallowing.        Two lacerations of lower face  Musculoskeletal: Negative for arthralgias, back pain and joint swelling.  Skin: Negative for color change.  Neurological: Negative for dizziness, weakness and numbness.  Hematological: Does not bruise/bleed easily.  Psychiatric/Behavioral: Negative for confusion.  All other systems reviewed and are negative.    Physical Exam Updated Vital Signs BP 117/66 (BP Location: Right Arm)   Pulse 73   Temp 97.8 F (36.6 C) (Oral)   Resp 18   Ht 6' (1.829 m)   Wt 65.8 kg (145 lb)   SpO2 95%   BMI 19.67 kg/m   Physical Exam  Constitutional: He is oriented to person, place, and time. He appears well-developed and well-nourished. No distress.  HENT:  Head:    Nose:  Nose normal.  Mouth/Throat: Oropharynx is clear and moist. No trismus in the jaw. Lacerations present.  Two linear lacerations to the chin. One laceration extends through the vermillion border.  minimal edema.  Bleeding controlled. Also laceration of the oral mucosa of the lower lip.  No dental injuries  Eyes: Pupils are equal, round, and reactive to light. EOM are normal.  Neck: Normal range of motion. Neck supple.  Cardiovascular: Normal rate, regular rhythm and intact distal pulses.   Pulmonary/Chest: Effort normal and breath sounds normal. No respiratory distress.  Musculoskeletal: Normal range of motion.  Neurological: He is alert and oriented to person, place, and time. No sensory deficit.  Skin: Skin is warm. Capillary refill takes  less than 2 seconds.  Nursing note and vitals reviewed.     ED Treatments / Results  Labs (all labs ordered are listed, but only abnormal results are displayed) Labs Reviewed - No data to display  EKG  EKG Interpretation None       Radiology No results found.  Procedures Procedures (including critical care time)  LACERATION REPAIR #1 Performed by: Marwan Lipe L. Authorized by: Maxwell CaulRIPLETT,Mishal Probert L. Consent: Verbal consent obtained. Risks and benefits: risks, benefits and alternatives were discussed Consent given by: patient Patient identity confirmed: provided demographic data Prepped and Draped in normal sterile fashion Wound explored  Laceration Location: chin  Laceration Length: 3 cm  No Foreign Bodies seen or palpated  Anesthesia: local infiltration  Local anesthetic: lidocaine 2% w/o epinephrine  Anesthetic total: 3 ml  Irrigation method: syringe Amount of cleaning: standard  Skin closure: 5-0 prolene  Number of sutures: 6  Technique: simple interrupted  Patient tolerance: Patient tolerated the procedure well with no immediate complications.  LACERATION REPAIR #2  Performed by: Chao Blazejewski L. Authorized by: Maxwell CaulRIPLETT,Mirayah Wren L. Consent: Verbal consent obtained. Risks and benefits: risks, benefits and alternatives were discussed Consent given by: patient Patient identity confirmed: provided demographic data Prepped and Draped in normal sterile fashion Wound explored  Laceration Location: chin  Laceration Length: 3 cm  No Foreign Bodies seen or palpated  Anesthesia: local infiltration  Local anesthetic: lidocaine 2 % w/o epinephrine  Anesthetic total: 3 ml  Irrigation method: syringe Amount of cleaning: standard  Skin closure: 5-0 prolene  Number of sutures: 6  Technique: simple interrupted  Patient tolerance: Patient tolerated the procedure well with no immediate complications.   LACERATION REPAIR #3 Performed by:  Laurajean Hosek L. Authorized by: Maxwell CaulRIPLETT,Kayliee Atienza L. Consent: Verbal consent obtained. Risks and benefits: risks, benefits and alternatives were discussed Consent given by: patient Patient identity confirmed: provided demographic data Prepped and Draped in normal sterile fashion Wound explored  Laceration Location: buccal mucosa  Laceration Length: 2 cm  No Foreign Bodies seen or palpated  Anesthesia: local infiltration  Local anesthetic: lidocaine 2 % w/o epinephrine  Anesthetic total: 2 ml  Irrigation method: syringe Amount of cleaning: standard  Skin closure: 6-0 vicryl  Number of sutures: 3  Technique: simple interrupted  Patient tolerance: Patient tolerated the procedure well with no immediate complications.    Medications Ordered in ED Medications  povidone-iodine (BETADINE) 10 % external solution (1 application  Given 05/14/17 1157)  lidocaine-EPINEPHrine-tetracaine (LET) solution (3 mLs Topical Given 05/14/17 1158)  lidocaine (PF) (XYLOCAINE) 1 % injection 10 mL (10 mLs Intradermal Given 05/14/17 1158)  Tdap (BOOSTRIX) injection 0.5 mL (0.5 mLs Intramuscular Given 05/14/17 1158)  oxyCODONE-acetaminophen (PERCOCET/ROXICET) 5-325 MG per tablet 1 tablet (1 tablet Oral Given 05/14/17 1234)     Initial Impression /  Assessment and Plan / ED Course  I have reviewed the triage vital signs and the nursing notes.  Pertinent labs & imaging results that were available during my care of the patient were reviewed by me and considered in my medical decision making (see chart for details).     Pt also seen by Dr. Adriana Simasook and care plan discussed  [facility] filed reports and verified dog's vaccines are current.    Pt's td updated.  Agrees to wound care instructions, wound check in 2 days, soft foods and liquids and suture removal in 7 days.  Wound infection precautions discussed.   Final Clinical Impressions(s) / ED Diagnoses   Final diagnoses:  Dog bite  of face, initial encounter    New Prescriptions Discharge Medication List as of 05/14/2017  3:07 PM    START taking these medications   Details  amoxicillin-clavulanate (AUGMENTIN) 875-125 MG tablet Take 1 tablet by mouth every 12 (twelve) hours., Starting Thu 05/14/2017, Print    oxyCODONE-acetaminophen (PERCOCET/ROXICET) 5-325 MG tablet Take 1 tablet by mouth every 4 (four) hours as needed., Starting Thu 05/14/2017, Print         Lorrine Killilea, Padenammy, PA-C 05/18/17 2220    Donnetta Hutchingook, Brian, MD 05/21/17 1321

## 2017-05-14 NOTE — ED Triage Notes (Signed)
Pt has laceration x 2 to chin from dog bite. Pt was bit by his own dog, up to date on vaccinations.

## 2017-05-16 ENCOUNTER — Emergency Department (HOSPITAL_COMMUNITY)
Admission: EM | Admit: 2017-05-16 | Discharge: 2017-05-16 | Disposition: A | Discharge status: Home / Self Care | Payer: Medicare HMO | Attending: Emergency Medicine | Admitting: Emergency Medicine

## 2017-05-16 ENCOUNTER — Encounter (HOSPITAL_COMMUNITY): Payer: Self-pay | Admitting: Emergency Medicine

## 2017-05-16 DIAGNOSIS — Z5189 Encounter for other specified aftercare: Secondary | ICD-10-CM

## 2017-05-16 DIAGNOSIS — Z4800 Encounter for change or removal of nonsurgical wound dressing: Secondary | ICD-10-CM | POA: Diagnosis not present

## 2017-05-16 NOTE — ED Notes (Signed)
Here for wound check of dog bite repair of 2 days ago

## 2017-05-16 NOTE — ED Provider Notes (Signed)
[facility]-EM[facility]Y DEPT Provider Note   CSN: [id number] Arrival date & time: 05/16/17  1621     History   Chief Complaint Chief Complaint  Patient presents with  . Wound Check    HPI Wayne Rowe is a 56 y.o. male.  Patient sustained a dog bite to the lower lip on July 26. He returns today for recheck.  The patient states that he sustained a bite by his own dog on. The dog is up-to-date on vaccinations. The wound was repaired here in the emergency department. The patient was placed on antibiotic care. The patient returns for evaluation. He denies fever or chills. He denies any pus like drainage. There's been no red streaks appreciated.   The history is provided by the patient.  Wound Check     Past Medical History:  Diagnosis Date  . Anxiety   . Arthritis   . Back pain   . DDD (degenerative disc disease)     There are no active problems to display for this patient.   Past Surgical History:  Procedure Laterality Date  . BACK SURGERY         Home Medications    Prior to Admission medications   Medication Sig Start Date End Date Taking? Authorizing Provider  amoxicillin-clavulanate (AUGMENTIN) 875-125 MG tablet Take 1 tablet by mouth every 12 (twelve) hours. 05/14/17   Triplett, Tammy, PA-C  carisoprodol (SOMA) 350 MG tablet Take 350 mg by mouth 4 (four) times daily as needed for muscle spasms.    [provider]  celecoxib (CELEBREX) 200 MG capsule Take 200 mg by mouth daily.    [provider]  diazepam (VALIUM) 10 MG tablet Take 10 mg by mouth 4 (four) times daily.     [provider]  dicyclomine (BENTYL) 20 MG tablet Take 1 tablet (20 mg total) by mouth every 6 (six) hours as needed for spasms (abdominal cramping). 11/28/16   Samuel JesterMcManus, Kathleen, DO  HYDROcodone-acetaminophen (NORCO) 10-325 MG per tablet Take 1 tablet by mouth every 6 (six) hours as needed for pain.    [provider]  oxyCODONE-acetaminophen (PERCOCET/ROXICET)  5-325 MG tablet Take 1 tablet by mouth every 4 (four) hours as needed. 05/14/17   Triplett, Tammy, PA-C  Polyvinyl Alcohol-Povidone (REFRESH OP) Place 1 drop into both eyes 3 (three) times daily.    [provider]  pregabalin (LYRICA) 200 MG capsule Take 200 mg by mouth 3 (three) times daily.    [provider]  tamsulosin (FLOMAX) 0.4 MG CAPS capsule Take 0.4 mg by mouth daily. 04/12/15   [provider]    Family History History reviewed. No pertinent family history.  Social History Social History  Substance Use Topics  . Smoking status: Current Every Day Smoker    Packs/day: 1.00    Types: Cigarettes  . Smokeless tobacco: Never Used  . Alcohol use No     Allergies   Patient has no known allergies.   Review of Systems Review of Systems  Skin: Positive for wound.  All other systems reviewed and are negative.    Physical Exam Updated Vital Signs BP 102/69 (BP Location: Right Arm)   Pulse 85   Temp 97.9 F (36.6 C) (Oral)   Resp 18   Ht 6' (1.829 m)   Wt 65.8 kg (145 lb)   SpO2 100%   BMI 19.67 kg/m   Physical Exam  Constitutional: He is oriented to person, place, and time. He appears well-developed and well-nourished.  Non-toxic appearance.  HENT:  Head: Normocephalic.  Right Ear: Tympanic membrane and external ear normal.  Left Ear: Tympanic membrane and external ear normal.  Lacerations to the lower lip are healing nicely. Sutures remain in place. There is no red streaking appreciated. Is no drainage appreciated.  The laceration to the mucosa of the lip and the lower gum are healing nicely. There no red streaks appreciated. There no pus pockets appreciated. The tongue is not swollen. The airway is patent.  Eyes: Pupils are equal, round, and reactive to light. EOM and lids are normal.  Neck: Normal range of motion. Neck supple. Carotid bruit is not present.  There no palpable nodes of the of the submental area.  Cardiovascular: Normal  rate, regular rhythm, normal heart sounds, intact distal pulses and normal pulses.   Pulmonary/Chest: No respiratory distress. He has wheezes.  Abdominal: Soft. Bowel sounds are normal. There is no tenderness. There is no guarding.  Musculoskeletal: Normal range of motion.  Lymphadenopathy:       Head (right side): No submandibular adenopathy present.       Head (left side): No submandibular adenopathy present.    He has no cervical adenopathy.  Neurological: He is alert and oriented to person, place, and time. He has normal strength. No cranial nerve deficit or sensory deficit.  Skin: Skin is warm and dry.  Psychiatric: He has a normal mood and affect. His speech is normal.  Nursing note and vitals reviewed.    ED Treatments / Results  Labs (all labs ordered are listed, but only abnormal results are displayed) Labs Reviewed - No data to display  EKG  EKG Interpretation None       Radiology No results found.  Procedures Procedures (including critical care time)  Medications Ordered in ED Medications - No data to display   Initial Impression / Assessment and Plan / ED Course  I have reviewed the triage vital signs and the nursing notes.  Pertinent labs & imaging results that were available during my care of the patient were reviewed by me and considered in my medical decision making (see chart for details).      Final Clinical Impressions(s) / ED Diagnoses MDM The dog bite wounds are progressing nicely. The vital signs within normal limits. I've asked the patient to continue to cleanse the wound with soap and water and to continue to apply the Neosporin ointment. Patient is to have the sutures removed in a few days. The patient is to see his primary physician or return to the emergency department if any signs of advancing infection.    Final diagnoses:  Visit for wound check    New Prescriptions New Prescriptions   No medications on file     Duayne CalBryant, Kiarah Eckstein,  PA-C 05/16/17 1733    Bethann BerkshireZammit, Joseph, MD 05/16/17 2245

## 2017-05-16 NOTE — Discharge Instructions (Signed)
Please continue to cleanse the wound with soap and water. Continue to apply Neosporin daily. Please have the sutures removed either by your primary physician, urgent care, or return to this emergency department. Your wounds are progressing nicely, keep up the good work. Use antibiotics as prescribed.

## 2017-05-16 NOTE — ED Triage Notes (Signed)
Pt presents for wound check from dog bite 2 days ago.  No complaints.

## 2017-05-21 ENCOUNTER — Encounter (HOSPITAL_COMMUNITY): Payer: Self-pay

## 2017-05-21 ENCOUNTER — Emergency Department (HOSPITAL_COMMUNITY)
Admission: EM | Admit: 2017-05-21 | Discharge: 2017-05-21 | Disposition: A | Discharge status: Home / Self Care | Payer: Medicare HMO | Attending: Emergency Medicine | Admitting: Emergency Medicine

## 2017-05-21 DIAGNOSIS — F17210 Nicotine dependence, cigarettes, uncomplicated: Secondary | ICD-10-CM | POA: Insufficient documentation

## 2017-05-21 DIAGNOSIS — Z79899 Other long term (current) drug therapy: Secondary | ICD-10-CM | POA: Diagnosis not present

## 2017-05-21 DIAGNOSIS — S0185XD Open bite of other part of head, subsequent encounter: Secondary | ICD-10-CM | POA: Insufficient documentation

## 2017-05-21 DIAGNOSIS — W540XXD Bitten by dog, subsequent encounter: Secondary | ICD-10-CM | POA: Diagnosis not present

## 2017-05-21 DIAGNOSIS — Z4802 Encounter for removal of sutures: Secondary | ICD-10-CM | POA: Insufficient documentation

## 2017-05-21 NOTE — Discharge Instructions (Signed)
Return if needed

## 2017-05-21 NOTE — ED Notes (Signed)
Pt alert & oriented x4, stable gait. Patient given discharge instructions, paperwork & prescription(s). Patient  instructed to stop at the registration desk to finish any additional paperwork. Patient verbalized understanding. Pt left department w/ no further questions. 

## 2017-05-21 NOTE — ED Triage Notes (Signed)
Here for suture removal to mouth. Denies any new issues.

## 2017-05-22 NOTE — ED Provider Notes (Signed)
[facility] Provider Note   CSN: [id number] Arrival date & time: 05/21/17  1850     History   Chief Complaint Chief Complaint  Patient presents with  . Suture / Staple Removal    HPI Wayne LauraBarry C Ostrum is a 56 y.o. male.  HPI   Wayne Rowe is a 56 y.o. male who presents to the Emergency Department requesting suture removal.  He was seen by me 7 days ago and treated for a dog bite to his lower face.  He received multiple sutures to repair the wounds.  He states the lacerations are healing well and he denies complications.  Currently taking augmentin   Past Medical History:  Diagnosis Date  . Anxiety   . Arthritis   . Back pain   . DDD (degenerative disc disease)     There are no active problems to display for this patient.   Past Surgical History:  Procedure Laterality Date  . BACK SURGERY         Home Medications    Prior to Admission medications   Medication Sig Start Date End Date Taking? Authorizing Provider  amoxicillin-clavulanate (AUGMENTIN) 875-125 MG tablet Take 1 tablet by mouth every 12 (twelve) hours. 05/14/17   Sherel Fennell, PA-C  carisoprodol (SOMA) 350 MG tablet Take 350 mg by mouth 4 (four) times daily as needed for muscle spasms.    [provider]  celecoxib (CELEBREX) 200 MG capsule Take 200 mg by mouth daily.    [provider]  diazepam (VALIUM) 10 MG tablet Take 10 mg by mouth 4 (four) times daily.     [provider]  dicyclomine (BENTYL) 20 MG tablet Take 1 tablet (20 mg total) by mouth every 6 (six) hours as needed for spasms (abdominal cramping). 11/28/16   Samuel JesterMcManus, Kathleen, DO  HYDROcodone-acetaminophen (NORCO) 10-325 MG per tablet Take 1 tablet by mouth every 6 (six) hours as needed for pain.    [provider]  oxyCODONE-acetaminophen (PERCOCET/ROXICET) 5-325 MG tablet Take 1 tablet by mouth every 4 (four) hours as needed. 05/14/17   Yuleidy Rappleye, PA-C  Polyvinyl Alcohol-Povidone (REFRESH  OP) Place 1 drop into both eyes 3 (three) times daily.    [provider]  pregabalin (LYRICA) 200 MG capsule Take 200 mg by mouth 3 (three) times daily.    [provider]  tamsulosin (FLOMAX) 0.4 MG CAPS capsule Take 0.4 mg by mouth daily. 04/12/15   [provider]    Family History No family history on file.  Social History Social History  Substance Use Topics  . Smoking status: Current Every Day Smoker    Packs/day: 1.00    Types: Cigarettes  . Smokeless tobacco: Never Used  . Alcohol use No     Allergies   Patient has no known allergies.   Review of Systems Review of Systems  Constitutional: Negative for chills and fever.  Musculoskeletal: Negative for arthralgias, back pain and joint swelling.  Skin: Positive for wound.       Sutured lacerations to lower face  Neurological: Negative for dizziness, weakness and numbness.  Hematological: Does not bruise/bleed easily.  All other systems reviewed and are negative.    Physical Exam Updated Vital Signs BP 110/73 (BP Location: Right Arm)   Pulse 72   Temp (!) 97.3 F (36.3 C) (Oral)   Resp 15   Ht 6' (1.829 m)   Wt 65.8 kg (145 lb)   SpO2 95%   BMI 19.67 kg/m  Physical Exam  Constitutional: He is oriented to person, place, and time. He appears well-developed and well-nourished. No distress.  HENT:  Head: Normocephalic and atraumatic.  Two lacerations to the lower face with sutures in place.  Appear to be healing well.  No surrounding erythema, edema or drainage.  Suture lines intact  Cardiovascular: Normal rate, regular rhythm and intact distal pulses.   Pulmonary/Chest: Effort normal and breath sounds normal. No respiratory distress.  Musculoskeletal: He exhibits no edema or tenderness.  Neurological: He is alert and oriented to person, place, and time. No sensory deficit. He exhibits normal muscle tone. Coordination normal.  Skin: Skin is warm.  Nursing note and vitals  reviewed.    ED Treatments / Results  Labs (all labs ordered are listed, but only abnormal results are displayed) Labs Reviewed - No data to display  EKG  EKG Interpretation None       Radiology No results found.  Procedures Procedures (including critical care time)  Medications Ordered in ED Medications - No data to display   Initial Impression / Assessment and Plan / ED Course  I have reviewed the triage vital signs and the nursing notes.  Pertinent labs & imaging results that were available during my care of the patient were reviewed by me and considered in my medical decision making (see chart for details).     Sutured removed by nursing w/o difficulty.  Healing well.  No wound dehiscence  Final Clinical Impressions(s) / ED Diagnoses   Final diagnoses:  Visit for suture removal    New Prescriptions Discharge Medication List as of 05/21/2017  7:17 PM       Pauline Ausriplett, Thereasa Iannello, PA-C 05/22/17 0052    Doug SouJacubowitz, Sam, MD 05/22/17 1441

## 2017-06-11 DIAGNOSIS — N509 Disorder of male genital organs, unspecified: Secondary | ICD-10-CM | POA: Diagnosis not present

## 2017-07-22 DIAGNOSIS — Z1211 Encounter for screening for malignant neoplasm of colon: Secondary | ICD-10-CM | POA: Diagnosis not present

## 2017-07-31 DIAGNOSIS — N400 Enlarged prostate without lower urinary tract symptoms: Secondary | ICD-10-CM | POA: Diagnosis not present

## 2017-07-31 DIAGNOSIS — G894 Chronic pain syndrome: Secondary | ICD-10-CM | POA: Diagnosis not present

## 2017-07-31 DIAGNOSIS — F419 Anxiety disorder, unspecified: Secondary | ICD-10-CM | POA: Diagnosis not present

## 2017-07-31 DIAGNOSIS — Z681 Body mass index (BMI) 19 or less, adult: Secondary | ICD-10-CM | POA: Diagnosis not present

## 2017-08-28 ENCOUNTER — Other Ambulatory Visit (HOSPITAL_COMMUNITY)
Admission: RE | Admit: 2017-08-28 | Discharge: 2017-08-28 | Disposition: A | Discharge status: Home / Self Care | Payer: Medicare HMO | Source: Other Acute Inpatient Hospital | Attending: Urology | Admitting: Urology

## 2017-08-28 DIAGNOSIS — N50811 Right testicular pain: Secondary | ICD-10-CM | POA: Diagnosis not present

## 2017-08-28 DIAGNOSIS — Z125 Encounter for screening for malignant neoplasm of prostate: Secondary | ICD-10-CM | POA: Diagnosis not present

## 2017-08-28 DIAGNOSIS — Z7721 Contact with and (suspected) exposure to potentially hazardous body fluids: Secondary | ICD-10-CM | POA: Insufficient documentation

## 2017-08-28 LAB — RAPID HIV SCREEN (HIV 1/2 AB+AG)
HIV 1/2 ANTIBODIES: NONREACTIVE
HIV-1 P24 ANTIGEN - HIV24: NONREACTIVE

## 2017-11-05 DIAGNOSIS — N50811 Right testicular pain: Secondary | ICD-10-CM | POA: Diagnosis not present

## 2017-11-05 DIAGNOSIS — N50812 Left testicular pain: Secondary | ICD-10-CM | POA: Diagnosis not present

## 2017-11-20 DIAGNOSIS — J449 Chronic obstructive pulmonary disease, unspecified: Secondary | ICD-10-CM | POA: Diagnosis not present

## 2017-11-20 DIAGNOSIS — F419 Anxiety disorder, unspecified: Secondary | ICD-10-CM | POA: Diagnosis not present

## 2017-11-20 DIAGNOSIS — F1120 Opioid dependence, uncomplicated: Secondary | ICD-10-CM | POA: Diagnosis not present

## 2017-11-20 DIAGNOSIS — J069 Acute upper respiratory infection, unspecified: Secondary | ICD-10-CM | POA: Diagnosis not present

## 2017-11-20 DIAGNOSIS — Z1389 Encounter for screening for other disorder: Secondary | ICD-10-CM | POA: Diagnosis not present

## 2017-11-20 DIAGNOSIS — Z6822 Body mass index (BMI) 22.0-22.9, adult: Secondary | ICD-10-CM | POA: Diagnosis not present

## 2017-12-04 DIAGNOSIS — J329 Chronic sinusitis, unspecified: Secondary | ICD-10-CM | POA: Diagnosis not present

## 2017-12-04 DIAGNOSIS — M1991 Primary osteoarthritis, unspecified site: Secondary | ICD-10-CM | POA: Diagnosis not present

## 2017-12-04 DIAGNOSIS — Z6821 Body mass index (BMI) 21.0-21.9, adult: Secondary | ICD-10-CM | POA: Diagnosis not present

## 2017-12-04 DIAGNOSIS — J449 Chronic obstructive pulmonary disease, unspecified: Secondary | ICD-10-CM | POA: Diagnosis not present

## 2017-12-04 DIAGNOSIS — G894 Chronic pain syndrome: Secondary | ICD-10-CM | POA: Diagnosis not present

## 2018-01-28 DIAGNOSIS — N509 Disorder of male genital organs, unspecified: Secondary | ICD-10-CM | POA: Diagnosis not present

## 2018-02-15 DIAGNOSIS — Z1389 Encounter for screening for other disorder: Secondary | ICD-10-CM | POA: Diagnosis not present

## 2018-02-15 DIAGNOSIS — F419 Anxiety disorder, unspecified: Secondary | ICD-10-CM | POA: Diagnosis not present

## 2018-02-15 DIAGNOSIS — G894 Chronic pain syndrome: Secondary | ICD-10-CM | POA: Diagnosis not present

## 2018-02-15 DIAGNOSIS — J329 Chronic sinusitis, unspecified: Secondary | ICD-10-CM | POA: Diagnosis not present

## 2018-02-15 DIAGNOSIS — J9801 Acute bronchospasm: Secondary | ICD-10-CM | POA: Diagnosis not present

## 2018-02-15 DIAGNOSIS — Z6822 Body mass index (BMI) 22.0-22.9, adult: Secondary | ICD-10-CM | POA: Diagnosis not present

## 2018-02-25 DIAGNOSIS — N509 Disorder of male genital organs, unspecified: Secondary | ICD-10-CM | POA: Diagnosis not present

## 2018-02-26 ENCOUNTER — Other Ambulatory Visit: Payer: Self-pay

## 2018-02-26 ENCOUNTER — Encounter (HOSPITAL_COMMUNITY): Payer: Self-pay | Admitting: *Deleted

## 2018-02-26 ENCOUNTER — Emergency Department (HOSPITAL_COMMUNITY)
Admission: EM | Admit: 2018-02-26 | Discharge: 2018-02-26 | Disposition: A | Discharge status: Home / Self Care | Payer: Medicare HMO | Attending: Emergency Medicine | Admitting: Emergency Medicine

## 2018-02-26 ENCOUNTER — Emergency Department (HOSPITAL_COMMUNITY): Payer: Medicare HMO

## 2018-02-26 DIAGNOSIS — X500XXA Overexertion from strenuous movement or load, initial encounter: Secondary | ICD-10-CM | POA: Insufficient documentation

## 2018-02-26 DIAGNOSIS — F17210 Nicotine dependence, cigarettes, uncomplicated: Secondary | ICD-10-CM | POA: Diagnosis not present

## 2018-02-26 DIAGNOSIS — Z79899 Other long term (current) drug therapy: Secondary | ICD-10-CM | POA: Diagnosis not present

## 2018-02-26 DIAGNOSIS — M19011 Primary osteoarthritis, right shoulder: Secondary | ICD-10-CM | POA: Diagnosis not present

## 2018-02-26 DIAGNOSIS — S46919A Strain of unspecified muscle, fascia and tendon at shoulder and upper arm level, unspecified arm, initial encounter: Secondary | ICD-10-CM

## 2018-02-26 DIAGNOSIS — S46912A Strain of unspecified muscle, fascia and tendon at shoulder and upper arm level, left arm, initial encounter: Secondary | ICD-10-CM | POA: Diagnosis not present

## 2018-02-26 DIAGNOSIS — S4991XA Unspecified injury of right shoulder and upper arm, initial encounter: Secondary | ICD-10-CM | POA: Diagnosis present

## 2018-02-26 DIAGNOSIS — F419 Anxiety disorder, unspecified: Secondary | ICD-10-CM | POA: Insufficient documentation

## 2018-02-26 DIAGNOSIS — Y9389 Activity, other specified: Secondary | ICD-10-CM | POA: Diagnosis not present

## 2018-02-26 DIAGNOSIS — R52 Pain, unspecified: Secondary | ICD-10-CM

## 2018-02-26 DIAGNOSIS — M19012 Primary osteoarthritis, left shoulder: Secondary | ICD-10-CM | POA: Insufficient documentation

## 2018-02-26 DIAGNOSIS — S46911A Strain of unspecified muscle, fascia and tendon at shoulder and upper arm level, right arm, initial encounter: Secondary | ICD-10-CM | POA: Diagnosis not present

## 2018-02-26 DIAGNOSIS — M1990 Unspecified osteoarthritis, unspecified site: Secondary | ICD-10-CM | POA: Diagnosis not present

## 2018-02-26 DIAGNOSIS — Y998 Other external cause status: Secondary | ICD-10-CM | POA: Diagnosis not present

## 2018-02-26 DIAGNOSIS — Y929 Unspecified place or not applicable: Secondary | ICD-10-CM | POA: Insufficient documentation

## 2018-02-26 MED ORDER — KETOROLAC TROMETHAMINE 30 MG/ML IJ SOLN
30.0000 mg | Freq: Once | INTRAMUSCULAR | Status: AC
  Administered 2018-02-26: 30 mg via INTRAMUSCULAR
  Filled 2018-02-26: qty 1

## 2018-02-26 MED ORDER — PREDNISONE 10 MG PO TABS: ORAL_TABLET | ORAL | 0 refills | Status: DC

## 2018-02-26 MED ORDER — DEXAMETHASONE SODIUM PHOSPHATE 10 MG/ML IJ SOLN
10.0000 mg | Freq: Once | INTRAMUSCULAR | Status: AC
  Administered 2018-02-26: 10 mg via INTRAMUSCULAR
  Filled 2018-02-26: qty 1

## 2018-02-26 NOTE — Discharge Instructions (Addendum)
Your xrays do not show any bony injury or obvious dislocation or injury to the cartilage of your [facility] joint.  You do have mild arthritis here, though.  Take the prednisone taper, starting tomorrow, continuing with your other home medicines for pain. Apply a heating pad to your shoulders for 20 minutes several times daily.

## 2018-02-26 NOTE — ED Provider Notes (Signed)
[facility] EMERGENCY DEPARTMENT Provider Note   CSN: [id number] Arrival date & time: 02/26/18  1134     History   Chief Complaint Chief Complaint  Patient presents with  . Shoulder Pain    HPI Wayne Rowe is a 57 y.o. male with a past medical history significant for chronic lumbar pain with known degenerative disc disease presenting with bilateral anterior shoulder pain which occurred when he attempted to lift an air conditioner about 1 week ago.  He describes picking it up in front of him holding it at waist height and then attempting to raise it to shoulder height level when he developed the sudden onset of pain in his bilateral shoulders.  He already takes oxycodone, Soma and uses lidocaine cream for his chronic pain which is not relieving his shoulder pain.  He denies weakness or numbness in his arms or hands.  He is unable to raise his arms above shoulder height level without severe pain.  He denies pain in his chest or back, but states the pain does radiate into his upper shoulder blade area.  His left side is somewhat more painful than the right.  Other than rest he has found no alleviators for symptoms.  The history is provided by the patient.    Past Medical History:  Diagnosis Date  . Anxiety   . Arthritis   . Back pain   . DDD (degenerative disc disease)     There are no active problems to display for this patient.   Past Surgical History:  Procedure Laterality Date  . BACK SURGERY          Home Medications    Prior to Admission medications   Medication Sig Start Date End Date Taking? Authorizing Provider  carisoprodol (SOMA) 350 MG tablet Take 350 mg by mouth 4 (four) times daily as needed for muscle spasms.   Yes [provider]  celecoxib (CELEBREX) 200 MG capsule Take 200 mg by mouth daily.   Yes [provider]  diazepam (VALIUM) 10 MG tablet Take 10 mg by mouth 4 (four) times daily.    Yes [provider]    HYDROcodone-acetaminophen (NORCO) 10-325 MG per tablet Take 1 tablet by mouth every 6 (six) hours as needed for pain.   Yes [provider]  Polyvinyl Alcohol-Povidone (REFRESH OP) Place 1 drop into both eyes 3 (three) times daily.   Yes [provider]  pregabalin (LYRICA) 200 MG capsule Take 200 mg by mouth 3 (three) times daily.   Yes [provider]  tamsulosin (FLOMAX) 0.4 MG CAPS capsule Take 0.4 mg by mouth 2 (two) times daily.  04/12/15  Yes [provider]  predniSONE (DELTASONE) 10 MG tablet Take 6 tablets day one, 5 tablets day two, 4 tablets day three, 3 tablets day four, 2 tablets day five, then 1 tablet day six 02/26/18   Burgess Amor, PA-C    Family History No family history on file.  Social History Social History   Tobacco Use  . Smoking status: Current Every Day Smoker    Packs/day: 1.00    Types: Cigarettes  . Smokeless tobacco: Never Used  Substance Use Topics  . Alcohol use: Yes    Comment: "once in awhile"  . Drug use: Yes    Types: Marijuana    Comment: last used 02/25/18     Allergies   Patient has no known allergies.   Review of Systems Review of Systems  Constitutional: Negative for fever.  Musculoskeletal: Positive for arthralgias. Negative for joint swelling and myalgias.  Neurological: Negative for weakness and numbness.     Physical Exam Updated Vital Signs BP 116/90 (BP Location: Left Arm)   Pulse 76   Temp (!) 97.5 F (36.4 C) (Oral)   Resp 16   Ht 6' (1.829 m)   Wt 74.8 kg (165 lb)   SpO2 100%   BMI 22.38 kg/m   Physical Exam  Constitutional: He appears well-developed and well-nourished.  HENT:  Head: Atraumatic.  Neck: Normal range of motion.  Cardiovascular:  Pulses equal bilaterally  Musculoskeletal: He exhibits tenderness. He exhibits no deformity.       Right shoulder: He exhibits bony tenderness. He exhibits no swelling, no effusion, no crepitus, no deformity and no spasm.       Left  shoulder: He exhibits bony tenderness. He exhibits no swelling, no effusion, no crepitus, no deformity and no spasm.  Pain localized to bilateral AC joints with radiation and ttp along the superior scapular ridge.  No palpable deformity or spasm appreciated.  Neurological: He is alert. He has normal strength. He displays normal reflexes. No sensory deficit.  Skin: Skin is warm and dry.  Psychiatric: He has a normal mood and affect.     ED Treatments / Results  Labs (all labs ordered are listed, but only abnormal results are displayed) Labs Reviewed - No data to display  EKG None  Radiology Dg Ac Joints  Result Date: 02/26/2018 CLINICAL DATA:  Bilateral acromioclavicular joint pain for 1 week since lifting an air conditioning unit. Initial encounter. EXAM: LEFT ACROMIOCLAVICULAR JOINTS COMPARISON:  None. FINDINGS: No acute bony or joint abnormality is seen. There is mild degenerative disease about both acromioclavicular joints. The left acromioclavicular joint is slightly wider than the right but its width does not change with weight-bearing. Imaged lung parenchyma and ribs are unremarkable. IMPRESSION: No acute abnormality. Mild bilateral acromioclavicular osteoarthritis. Electronically Signed   By: Drusilla Kanner M.D.   On: 02/26/2018 14:52    Procedures Procedures (including critical care time)  Medications Ordered in ED Medications  dexamethasone (DECADRON) injection 10 mg (has no administration in time range)  ketorolac (TORADOL) 30 MG/ML injection 30 mg (30 mg Intramuscular Given 02/26/18 1348)     Initial Impression / Assessment and Plan / ED Course  I have reviewed the triage vital signs and the nursing notes.  Pertinent labs & imaging results that were available during my care of the patient were reviewed by me and considered in my medical decision making (see chart for details).     Pt with no obvious findings on todays films. Suspect soft tissue strain/sprain.   Continue home meds, added prednisone taper. Referral to ortho or back to pcp if sx persist.  Final Clinical Impressions(s) / ED Diagnoses   Final diagnoses:  Strain of shoulder, unspecified laterality, initial encounter  Arthritis of both acromioclavicular joints    ED Discharge Orders        Ordered    predniSONE (DELTASONE) 10 MG tablet     02/26/18 1518       Burgess Amor, PA-C 02/26/18 1519    Bethann Berkshire, MD 03/01/18 [phone]

## 2018-02-26 NOTE — ED Notes (Signed)
Picked up air conditioner last week and both shoulders has been aching.  Rates pain 10/10.

## 2018-02-26 NOTE — ED Triage Notes (Signed)
Pt c/o bilateral shoulder pain after picking up a air conditioning unit about 1 week ago. Pt has used Lidocaine patches, Oxycodone, Soma at home without relief.

## 2018-04-15 DIAGNOSIS — N50811 Right testicular pain: Secondary | ICD-10-CM | POA: Diagnosis not present

## 2018-05-18 DIAGNOSIS — Z6820 Body mass index (BMI) 20.0-20.9, adult: Secondary | ICD-10-CM | POA: Diagnosis not present

## 2018-05-18 DIAGNOSIS — J449 Chronic obstructive pulmonary disease, unspecified: Secondary | ICD-10-CM | POA: Diagnosis not present

## 2018-05-18 DIAGNOSIS — G894 Chronic pain syndrome: Secondary | ICD-10-CM | POA: Diagnosis not present

## 2018-05-18 DIAGNOSIS — Z1389 Encounter for screening for other disorder: Secondary | ICD-10-CM | POA: Diagnosis not present

## 2018-05-18 DIAGNOSIS — N400 Enlarged prostate without lower urinary tract symptoms: Secondary | ICD-10-CM | POA: Diagnosis not present

## 2018-05-21 DIAGNOSIS — N509 Disorder of male genital organs, unspecified: Secondary | ICD-10-CM | POA: Diagnosis not present

## 2018-05-21 DIAGNOSIS — R3915 Urgency of urination: Secondary | ICD-10-CM | POA: Diagnosis not present

## 2018-07-01 DIAGNOSIS — N50811 Right testicular pain: Secondary | ICD-10-CM | POA: Diagnosis not present

## 2018-07-01 DIAGNOSIS — N50812 Left testicular pain: Secondary | ICD-10-CM | POA: Diagnosis not present

## 2018-08-09 DIAGNOSIS — Z125 Encounter for screening for malignant neoplasm of prostate: Secondary | ICD-10-CM | POA: Diagnosis not present

## 2018-08-24 DIAGNOSIS — Z0001 Encounter for general adult medical examination with abnormal findings: Secondary | ICD-10-CM | POA: Diagnosis not present

## 2018-08-24 DIAGNOSIS — Z23 Encounter for immunization: Secondary | ICD-10-CM | POA: Diagnosis not present

## 2018-08-24 DIAGNOSIS — Z6821 Body mass index (BMI) 21.0-21.9, adult: Secondary | ICD-10-CM | POA: Diagnosis not present

## 2018-08-24 DIAGNOSIS — N400 Enlarged prostate without lower urinary tract symptoms: Secondary | ICD-10-CM | POA: Diagnosis not present

## 2018-08-24 DIAGNOSIS — G894 Chronic pain syndrome: Secondary | ICD-10-CM | POA: Diagnosis not present

## 2018-08-24 DIAGNOSIS — Z1389 Encounter for screening for other disorder: Secondary | ICD-10-CM | POA: Diagnosis not present

## 2018-11-12 DIAGNOSIS — N509 Disorder of male genital organs, unspecified: Secondary | ICD-10-CM | POA: Diagnosis not present

## 2018-11-15 DIAGNOSIS — G894 Chronic pain syndrome: Secondary | ICD-10-CM | POA: Diagnosis not present

## 2018-11-15 DIAGNOSIS — N400 Enlarged prostate without lower urinary tract symptoms: Secondary | ICD-10-CM | POA: Diagnosis not present

## 2018-11-15 DIAGNOSIS — M1991 Primary osteoarthritis, unspecified site: Secondary | ICD-10-CM | POA: Diagnosis not present

## 2018-11-15 DIAGNOSIS — Z6821 Body mass index (BMI) 21.0-21.9, adult: Secondary | ICD-10-CM | POA: Diagnosis not present

## 2018-11-15 DIAGNOSIS — Z1389 Encounter for screening for other disorder: Secondary | ICD-10-CM | POA: Diagnosis not present

## 2018-11-15 DIAGNOSIS — F419 Anxiety disorder, unspecified: Secondary | ICD-10-CM | POA: Diagnosis not present

## 2018-12-15 DIAGNOSIS — N400 Enlarged prostate without lower urinary tract symptoms: Secondary | ICD-10-CM | POA: Diagnosis not present

## 2018-12-15 DIAGNOSIS — Z6822 Body mass index (BMI) 22.0-22.9, adult: Secondary | ICD-10-CM | POA: Diagnosis not present

## 2018-12-15 DIAGNOSIS — M1991 Primary osteoarthritis, unspecified site: Secondary | ICD-10-CM | POA: Diagnosis not present

## 2018-12-15 DIAGNOSIS — G894 Chronic pain syndrome: Secondary | ICD-10-CM | POA: Diagnosis not present

## 2018-12-15 DIAGNOSIS — J329 Chronic sinusitis, unspecified: Secondary | ICD-10-CM | POA: Diagnosis not present

## 2018-12-15 DIAGNOSIS — F1120 Opioid dependence, uncomplicated: Secondary | ICD-10-CM | POA: Diagnosis not present

## 2018-12-15 DIAGNOSIS — J449 Chronic obstructive pulmonary disease, unspecified: Secondary | ICD-10-CM | POA: Diagnosis not present

## 2018-12-24 ENCOUNTER — Encounter (INDEPENDENT_AMBULATORY_CARE_PROVIDER_SITE_OTHER): Payer: Self-pay | Admitting: *Deleted

## 2018-12-25 ENCOUNTER — Other Ambulatory Visit: Payer: Self-pay

## 2018-12-25 ENCOUNTER — Emergency Department (HOSPITAL_COMMUNITY): Admission: EM | Admit: 2018-12-25 | Discharge: 2018-12-25 | Discharge status: Absent without leave | Payer: Self-pay

## 2018-12-25 ENCOUNTER — Encounter (HOSPITAL_COMMUNITY): Payer: Self-pay

## 2018-12-25 ENCOUNTER — Emergency Department (HOSPITAL_COMMUNITY)
Admission: EM | Admit: 2018-12-25 | Discharge: 2018-12-26 | Disposition: A | Discharge status: Home / Self Care | Payer: Medicare HMO | Attending: Emergency Medicine | Admitting: Emergency Medicine

## 2018-12-25 ENCOUNTER — Emergency Department (HOSPITAL_COMMUNITY): Payer: Medicare HMO

## 2018-12-25 DIAGNOSIS — R079 Chest pain, unspecified: Secondary | ICD-10-CM | POA: Diagnosis not present

## 2018-12-25 DIAGNOSIS — F17210 Nicotine dependence, cigarettes, uncomplicated: Secondary | ICD-10-CM | POA: Diagnosis not present

## 2018-12-25 DIAGNOSIS — Z79899 Other long term (current) drug therapy: Secondary | ICD-10-CM | POA: Insufficient documentation

## 2018-12-25 DIAGNOSIS — R112 Nausea with vomiting, unspecified: Secondary | ICD-10-CM | POA: Diagnosis not present

## 2018-12-25 LAB — BASIC METABOLIC PANEL
ANION GAP: 8 (ref 5–15)
BUN: 10 mg/dL (ref 6–20)
CHLORIDE: 103 mmol/L (ref 98–111)
CO2: 26 mmol/L (ref 22–32)
Calcium: 8.4 mg/dL — ABNORMAL LOW (ref 8.9–10.3)
Creatinine, Ser: 1.14 mg/dL (ref 0.61–1.24)
GFR calc non Af Amer: >60 mL/min (ref >60)
Glucose, Bld: 118 mg/dL — ABNORMAL HIGH (ref 70–99)
POTASSIUM: 3.1 mmol/L — AB (ref 3.5–5.1)
Sodium: 137 mmol/L (ref 135–145)

## 2018-12-25 LAB — CBC
HEMATOCRIT: 43.8 % (ref 39.0–52.0)
Hemoglobin: 13.7 g/dL (ref 13.0–17.0)
MCH: 29 pg (ref 26.0–34.0)
MCHC: 31.3 g/dL (ref 30.0–36.0)
MCV: 92.8 fL (ref 80.0–100.0)
NRBC: 0 % (ref 0.0–0.2)
Platelets: 271 10*3/uL (ref 150–400)
RBC: 4.72 MIL/uL (ref 4.22–5.81)
RDW: 13.2 % (ref 11.5–15.5)
WBC: 10.2 10*3/uL (ref 4.0–10.5)

## 2018-12-25 LAB — TROPONIN I: Troponin I: <0.03 ng/mL (ref <0.03)

## 2018-12-25 MED ORDER — SODIUM CHLORIDE 0.9% FLUSH
3.0000 mL | Freq: Once | INTRAVENOUS | Status: AC
  Administered 2018-12-25: 3 mL via INTRAVENOUS

## 2018-12-25 NOTE — ED Triage Notes (Signed)
Pt reports chest (epigastric pain) that started about a week ago, intermittently with nausea/vomting and diaphoresis. Sometimes these episodes occur after eating.

## 2018-12-25 NOTE — ED Notes (Signed)
Patient transported to X-ray 

## 2018-12-25 NOTE — ED Notes (Signed)
ED Provider at bedside. 

## 2018-12-25 NOTE — ED Provider Notes (Signed)
[facility] EMERGENCY DEPARTMENT Provider Note   CSN: [id number] Arrival date & time: 12/25/18  2213    History   Chief Complaint Chief Complaint  Patient presents with  . Chest Pain    HPI Wayne Rowe is a 58 y.o. male.     Patient presents to the emergency department for evaluation of chest pain.  He reports that he first noticed the pain 1 week ago.  Pain is in the center of his lower chest.  He reports that the pain has never gone away but at times becomes much lighter than others.  He has not identified anything that improves the pain or makes it worse.  He reports that when the pain suddenly gets worse it is a sharp pain, causes him to have sweating followed by nausea and vomiting.  He does not have any associated shortness of breath.     Past Medical History:  Diagnosis Date  . Anxiety   . Arthritis   . Back pain   . DDD (degenerative disc disease)     There are no active problems to display for this patient.   Past Surgical History:  Procedure Laterality Date  . BACK SURGERY          Home Medications    Prior to Admission medications   Medication Sig Start Date End Date Taking? Authorizing Provider  carisoprodol (SOMA) 350 MG tablet Take 350 mg by mouth 4 (four) times daily as needed for muscle spasms.    [provider]  celecoxib (CELEBREX) 200 MG capsule Take 200 mg by mouth daily.    [provider]  diazepam (VALIUM) 10 MG tablet Take 10 mg by mouth 4 (four) times daily.     [provider]  HYDROcodone-acetaminophen (NORCO) 10-325 MG per tablet Take 1 tablet by mouth every 6 (six) hours as needed for pain.    [provider]  ondansetron (ZOFRAN) 4 MG tablet Take 1 tablet (4 mg total) by mouth every 6 (six) hours as needed for nausea or vomiting. 12/26/18   Gilda Crease, MD  pantoprazole (PROTONIX) 40 MG tablet Take 1 tablet (40 mg total) by mouth daily. 12/26/18   Gilda Crease, MD  Polyvinyl  Alcohol-Povidone (REFRESH OP) Place 1 drop into both eyes 3 (three) times daily.    [provider]  predniSONE (DELTASONE) 10 MG tablet Take 6 tablets day one, 5 tablets day two, 4 tablets day three, 3 tablets day four, 2 tablets day five, then 1 tablet day six 02/26/18   Idol, Raynelle Fanning, PA-C  pregabalin (LYRICA) 200 MG capsule Take 200 mg by mouth 3 (three) times daily.    [provider]  sucralfate (CARAFATE) 1 g tablet Take 1 tablet (1 g total) by mouth 4 (four) times daily -  with meals and at bedtime. 12/26/18   Gilda Crease, MD  tamsulosin (FLOMAX) 0.4 MG CAPS capsule Take 0.4 mg by mouth 2 (two) times daily.  04/12/15   [provider]    Family History No family history on file.  Social History Social History   Tobacco Use  . Smoking status: Current Every Day Smoker    Packs/day: 1.00    Types: Cigarettes  . Smokeless tobacco: Never Used  Substance Use Topics  . Alcohol use: Not Currently  . Drug use: Yes    Types: Marijuana     Allergies   Patient has no known allergies.   Review of Systems Review of Systems  Constitutional: Positive for diaphoresis.  Respiratory: Negative for shortness of breath.   Cardiovascular: Positive for chest pain.  Gastrointestinal: Positive for nausea and vomiting.  All other systems reviewed and are negative.    Physical Exam Updated Vital Signs BP 113/71   Pulse 67   Temp 98.2 F (36.8 C) (Oral)   Resp 15   Ht 6' (1.829 m)   Wt 72.6 kg   SpO2 96%   BMI 21.70 kg/m   Physical Exam Vitals signs and nursing note reviewed.  Constitutional:      General: He is not in acute distress.    Appearance: Normal appearance. He is well-developed.  HENT:     Head: Normocephalic and atraumatic.     Right Ear: Hearing normal.     Left Ear: Hearing normal.     Nose: Nose normal.  Eyes:     Conjunctiva/sclera: Conjunctivae normal.     Pupils: Pupils are equal, round, and reactive to light.  Neck:      Musculoskeletal: Normal range of motion and neck supple.  Cardiovascular:     Rate and Rhythm: Regular rhythm.     Heart sounds: S1 normal and S2 normal. No murmur. No friction rub. No gallop.   Pulmonary:     Effort: Pulmonary effort is normal. No respiratory distress.     Breath sounds: Normal breath sounds.  Chest:     Chest wall: No tenderness.    Abdominal:     General: Bowel sounds are normal.     Palpations: Abdomen is soft.     Tenderness: There is no abdominal tenderness. There is no guarding or rebound. Negative signs include Murphy's sign and McBurney's sign.     Hernia: No hernia is present.  Musculoskeletal: Normal range of motion.  Skin:    General: Skin is warm and dry.     Findings: No rash.  Neurological:     Mental Status: He is alert and oriented to person, place, and time.     GCS: GCS eye subscore is 4. GCS verbal subscore is 5. GCS motor subscore is 6.     Cranial Nerves: No cranial nerve deficit.     Sensory: No sensory deficit.     Coordination: Coordination normal.  Psychiatric:        Speech: Speech normal.        Behavior: Behavior normal.        Thought Content: Thought content normal.      ED Treatments / Results  Labs (all labs ordered are listed, but only abnormal results are displayed) Labs Reviewed  BASIC METABOLIC PANEL - Abnormal; Notable for the following components:      Result Value   Potassium 3.1 (*)    Glucose, Bld 118 (*)    Calcium 8.4 (*)    All other components within normal limits  CBC  TROPONIN I    EKG EKG Interpretation  Date/Time:  Saturday December 25 2018 22:19:50 EST Ventricular Rate:  95 PR Interval:  126 QRS Duration: 84 QT Interval:  356 QTC Calculation: 447 R Axis:   80 Text Interpretation:  Sinus rhythm with Premature atrial complexes Otherwise normal ECG Confirmed by Gilda Crease [phone]) on 12/25/2018 11:08:34 PM   Radiology Dg Chest 2 View  Result Date: 12/25/2018 CLINICAL DATA:  Epigastric  pain.  Nausea and vomiting. EXAM: CHEST - 2 VIEW COMPARISON:  February 12, 2015 FINDINGS: The heart size and mediastinal contours are within normal limits. Both lungs are clear.  The visualized skeletal structures are unremarkable. IMPRESSION: No active cardiopulmonary disease. Electronically Signed   By: Gerome Samavid  Williams III M.D   On: 12/25/2018 23:24    Procedures Procedures (including critical care time)  Medications Ordered in ED Medications  sodium chloride flush (NS) 0.9 % injection 3 mL (3 mLs Intravenous Given 12/25/18 2235)     Initial Impression / Assessment and Plan / ED Course  I have reviewed the triage vital signs and the nursing notes.  Pertinent labs & imaging results that were available during my care of the patient were reviewed by me and considered in my medical decision making (see chart for details).        Patient presents to the emergency department for evaluation of chest pain.  Symptoms are atypical for cardiac etiology.  He reports that this started a week ago when he has not had any pain-free time since it started.  Despite 1 week of continuous pain, his EKG is normal and his troponin is negative.  This is reassuring.  Patient has associated symptoms of intermittent worsening with associated vomiting.  Consider GI causes.  Will bring back for gallbladder ultrasound, treat empirically with Protonix, Carafate, Zofran.  Follow-up with primary doctor.  Final Clinical Impressions(s) / ED Diagnoses   Final diagnoses:  Chest pain, unspecified type    ED Discharge Orders         Ordered    pantoprazole (PROTONIX) 40 MG tablet  Daily     12/26/18 0039    sucralfate (CARAFATE) 1 g tablet  3 times daily with meals & bedtime     12/26/18 0039    ondansetron (ZOFRAN) 4 MG tablet  Every 6 hours PRN     12/26/18 0039           Gilda CreasePollina, Christopher J, MD 12/26/18 [phone]

## 2018-12-26 ENCOUNTER — Ambulatory Visit (HOSPITAL_COMMUNITY)
Admit: 2018-12-26 | Discharge: 2018-12-26 | Disposition: A | Discharge status: Home / Self Care | Payer: Medicare HMO | Attending: Emergency Medicine | Admitting: Emergency Medicine

## 2018-12-26 DIAGNOSIS — R1010 Upper abdominal pain, unspecified: Secondary | ICD-10-CM | POA: Diagnosis not present

## 2018-12-26 MED ORDER — ONDANSETRON HCL 4 MG PO TABS: 4.0000 mg | ORAL_TABLET | Freq: Four times a day (QID) | ORAL | 0 refills | Status: DC | PRN

## 2018-12-26 MED ORDER — PANTOPRAZOLE SODIUM 40 MG PO TBEC: 40.0000 mg | DELAYED_RELEASE_TABLET | Freq: Every day | ORAL | 0 refills | Status: DC

## 2018-12-26 MED ORDER — SUCRALFATE 1 G PO TABS: 1.0000 g | ORAL_TABLET | Freq: Three times a day (TID) | ORAL | 0 refills | Status: DC

## 2018-12-26 NOTE — ED Provider Notes (Signed)
Pt returned for his RUQ [place] today after yesterdays encounter and work up for chest pain n/v.  He reports mild sx this am, but has not started the protonix, zofran and carafate prescribed last night.  [place] negative with normal gallbladder US.  Pt advised f/u with his pcp this week for a recheck of his sx.    Burgess Amor, PA-C 12/26/18 1122    Sabas Sous, MD 12/26/18 1259

## 2019-01-07 DIAGNOSIS — F1120 Opioid dependence, uncomplicated: Secondary | ICD-10-CM | POA: Diagnosis not present

## 2019-01-07 DIAGNOSIS — Z6820 Body mass index (BMI) 20.0-20.9, adult: Secondary | ICD-10-CM | POA: Diagnosis not present

## 2019-01-07 DIAGNOSIS — M1991 Primary osteoarthritis, unspecified site: Secondary | ICD-10-CM | POA: Diagnosis not present

## 2019-01-07 DIAGNOSIS — S61412A Laceration without foreign body of left hand, initial encounter: Secondary | ICD-10-CM | POA: Diagnosis not present

## 2019-01-07 DIAGNOSIS — J329 Chronic sinusitis, unspecified: Secondary | ICD-10-CM | POA: Diagnosis not present

## 2019-01-07 DIAGNOSIS — J449 Chronic obstructive pulmonary disease, unspecified: Secondary | ICD-10-CM | POA: Diagnosis not present

## 2019-02-16 DIAGNOSIS — N509 Disorder of male genital organs, unspecified: Secondary | ICD-10-CM | POA: Diagnosis not present

## 2019-03-08 DIAGNOSIS — Z6820 Body mass index (BMI) 20.0-20.9, adult: Secondary | ICD-10-CM | POA: Diagnosis not present

## 2019-03-08 DIAGNOSIS — M159 Polyosteoarthritis, unspecified: Secondary | ICD-10-CM | POA: Diagnosis not present

## 2019-03-08 DIAGNOSIS — Z1389 Encounter for screening for other disorder: Secondary | ICD-10-CM | POA: Diagnosis not present

## 2019-03-08 DIAGNOSIS — G894 Chronic pain syndrome: Secondary | ICD-10-CM | POA: Diagnosis not present

## 2019-03-08 DIAGNOSIS — Z0001 Encounter for general adult medical examination with abnormal findings: Secondary | ICD-10-CM | POA: Diagnosis not present

## 2019-03-14 ENCOUNTER — Inpatient Hospital Stay (HOSPITAL_COMMUNITY)
Admission: EM | Admit: 2019-03-14 | Discharge: 2019-03-16 | DRG: 191 | Disposition: A | Discharge status: Home / Self Care | Payer: Medicare HMO | Attending: Family Medicine | Admitting: Family Medicine

## 2019-03-14 ENCOUNTER — Other Ambulatory Visit: Payer: Self-pay

## 2019-03-14 ENCOUNTER — Emergency Department (HOSPITAL_COMMUNITY): Payer: Medicare HMO

## 2019-03-14 ENCOUNTER — Encounter (HOSPITAL_COMMUNITY): Payer: Self-pay | Admitting: *Deleted

## 2019-03-14 DIAGNOSIS — Z20828 Contact with and (suspected) exposure to other viral communicable diseases: Secondary | ICD-10-CM | POA: Diagnosis present

## 2019-03-14 DIAGNOSIS — J9600 Acute respiratory failure, unspecified whether with hypoxia or hypercapnia: Secondary | ICD-10-CM

## 2019-03-14 DIAGNOSIS — R079 Chest pain, unspecified: Secondary | ICD-10-CM

## 2019-03-14 DIAGNOSIS — R000 Tachycardia, unspecified: Secondary | ICD-10-CM | POA: Diagnosis present

## 2019-03-14 DIAGNOSIS — R0602 Shortness of breath: Secondary | ICD-10-CM

## 2019-03-14 DIAGNOSIS — Z79899 Other long term (current) drug therapy: Secondary | ICD-10-CM | POA: Diagnosis not present

## 2019-03-14 DIAGNOSIS — G8929 Other chronic pain: Secondary | ICD-10-CM | POA: Diagnosis present

## 2019-03-14 DIAGNOSIS — R0603 Acute respiratory distress: Secondary | ICD-10-CM | POA: Diagnosis present

## 2019-03-14 DIAGNOSIS — Z79891 Long term (current) use of opiate analgesic: Secondary | ICD-10-CM | POA: Diagnosis not present

## 2019-03-14 DIAGNOSIS — J439 Emphysema, unspecified: Secondary | ICD-10-CM | POA: Diagnosis not present

## 2019-03-14 DIAGNOSIS — E876 Hypokalemia: Secondary | ICD-10-CM

## 2019-03-14 DIAGNOSIS — R0789 Other chest pain: Secondary | ICD-10-CM | POA: Diagnosis not present

## 2019-03-14 DIAGNOSIS — Y92239 Unspecified place in hospital as the place of occurrence of the external cause: Secondary | ICD-10-CM | POA: Diagnosis present

## 2019-03-14 DIAGNOSIS — J441 Chronic obstructive pulmonary disease with (acute) exacerbation: Secondary | ICD-10-CM | POA: Diagnosis not present

## 2019-03-14 DIAGNOSIS — R1110 Vomiting, unspecified: Secondary | ICD-10-CM | POA: Diagnosis not present

## 2019-03-14 DIAGNOSIS — T50995A Adverse effect of other drugs, medicaments and biological substances, initial encounter: Secondary | ICD-10-CM | POA: Diagnosis not present

## 2019-03-14 DIAGNOSIS — R911 Solitary pulmonary nodule: Secondary | ICD-10-CM

## 2019-03-14 DIAGNOSIS — F419 Anxiety disorder, unspecified: Secondary | ICD-10-CM | POA: Diagnosis not present

## 2019-03-14 DIAGNOSIS — F17210 Nicotine dependence, cigarettes, uncomplicated: Secondary | ICD-10-CM | POA: Diagnosis present

## 2019-03-14 DIAGNOSIS — E873 Alkalosis: Secondary | ICD-10-CM

## 2019-03-14 DIAGNOSIS — M1990 Unspecified osteoarthritis, unspecified site: Secondary | ICD-10-CM | POA: Diagnosis not present

## 2019-03-14 LAB — SARS CORONAVIRUS 2 BY RT PCR (HOSPITAL ORDER, PERFORMED IN ~~LOC~~ HOSPITAL LAB): SARS Coronavirus 2: NEGATIVE

## 2019-03-14 LAB — COMPREHENSIVE METABOLIC PANEL
ALT: 22 U/L (ref 0–44)
AST: 42 U/L — ABNORMAL HIGH (ref 15–41)
Albumin: 4.5 g/dL (ref 3.5–5.0)
Alkaline Phosphatase: 90 U/L (ref 38–126)
Anion gap: 17 — ABNORMAL HIGH (ref 5–15)
BUN: 28 mg/dL — ABNORMAL HIGH (ref 6–20)
CO2: 18 mmol/L — ABNORMAL LOW (ref 22–32)
Calcium: 9.2 mg/dL (ref 8.9–10.3)
Chloride: 104 mmol/L (ref 98–111)
Creatinine, Ser: 1.26 mg/dL — ABNORMAL HIGH (ref 0.61–1.24)
GFR calc Af Amer: >60 mL/min (ref >60)
GFR calc non Af Amer: >60 mL/min (ref >60)
Glucose, Bld: 120 mg/dL — ABNORMAL HIGH (ref 70–99)
Potassium: 3.4 mmol/L — ABNORMAL LOW (ref 3.5–5.1)
Sodium: 139 mmol/L (ref 135–145)
Total Bilirubin: 1.4 mg/dL — ABNORMAL HIGH (ref 0.3–1.2)
Total Protein: 7.7 g/dL (ref 6.5–8.1)

## 2019-03-14 LAB — BLOOD GAS, VENOUS
Acid-Base Excess: 0.1 mmol/L (ref 0.0–2.0)
Acid-base deficit: 4.4 mmol/L — ABNORMAL HIGH (ref 0.0–2.0)
Bicarbonate: 24.8 mmol/L (ref 20.0–28.0)
Bicarbonate: 23.2 mmol/L (ref 20.0–28.0)
FIO2: 21
FIO2: 21
O2 Saturation: 43.2 %
O2 Saturation: 84.7 %
Patient temperature: 36.5
Patient temperature: 36.9
pCO2, Ven: 24 mmHg — ABNORMAL LOW (ref 44.0–60.0)
pCO2, Ven: <19 mmHg — CL (ref 44.0–60.0)
pH, Ven: 7.572 — ABNORMAL HIGH (ref 7.250–7.430)
pH, Ven: 7.627 (ref 7.250–7.430)
pO2, Ven: <31 mmHg — CL (ref 32.0–45.0)
pO2, Ven: 43.2 mmHg (ref 32.0–45.0)

## 2019-03-14 LAB — PROCALCITONIN: Procalcitonin: <0.1 ng/mL

## 2019-03-14 LAB — CBC
HCT: 47.9 % (ref 39.0–52.0)
Hemoglobin: 16.5 g/dL (ref 13.0–17.0)
MCH: 30.3 pg (ref 26.0–34.0)
MCHC: 34.4 g/dL (ref 30.0–36.0)
MCV: 87.9 fL (ref 80.0–100.0)
Platelets: 324 10*3/uL (ref 150–400)
RBC: 5.45 MIL/uL (ref 4.22–5.81)
RDW: 13.3 % (ref 11.5–15.5)
WBC: 13 10*3/uL — ABNORMAL HIGH (ref 4.0–10.5)
nRBC: 0 % (ref 0.0–0.2)

## 2019-03-14 LAB — D-DIMER, QUANTITATIVE: D-Dimer, Quant: <0.27 ug/mL-FEU (ref 0.00–0.50)

## 2019-03-14 LAB — MRSA PCR SCREENING: MRSA by PCR: NEGATIVE

## 2019-03-14 LAB — TROPONIN I: Troponin I: <0.03 ng/mL (ref <0.03)

## 2019-03-14 LAB — SALICYLATE LEVEL: Salicylate Lvl: <7 mg/dL (ref 2.8–30.0)

## 2019-03-14 LAB — C-REACTIVE PROTEIN: CRP: <0.8 mg/dL (ref <1.0)

## 2019-03-14 LAB — LACTATE DEHYDROGENASE: LDH: 171 U/L (ref 98–192)

## 2019-03-14 LAB — FERRITIN: Ferritin: 104 ng/mL (ref 24–336)

## 2019-03-14 LAB — BRAIN NATRIURETIC PEPTIDE: B Natriuretic Peptide: 27 pg/mL (ref 0.0–100.0)

## 2019-03-14 MED ORDER — HYDRALAZINE HCL 20 MG/ML IJ SOLN: 10.0000 mg | INTRAMUSCULAR | Status: DC | PRN

## 2019-03-14 MED ORDER — POTASSIUM CHLORIDE CRYS ER 20 MEQ PO TBCR: 40.0000 meq | EXTENDED_RELEASE_TABLET | Freq: Once | ORAL | Status: DC

## 2019-03-14 MED ORDER — POLYETHYLENE GLYCOL 3350 17 G PO PACK: 17.0000 g | PACK | Freq: Every day | ORAL | Status: DC | PRN

## 2019-03-14 MED ORDER — ACETAMINOPHEN 650 MG RE SUPP: 650.0000 mg | Freq: Four times a day (QID) | RECTAL | Status: DC | PRN

## 2019-03-14 MED ORDER — LEVALBUTEROL HCL 1.25 MG/0.5ML IN NEBU
1.2500 mg | INHALATION_SOLUTION | Freq: Three times a day (TID) | RESPIRATORY_TRACT | Status: DC
  Administered 2019-03-14: 18:00:00 1.25 mg via RESPIRATORY_TRACT
  Filled 2019-03-14: qty 0.5

## 2019-03-14 MED ORDER — LEVALBUTEROL HCL 1.25 MG/0.5ML IN NEBU: 1.2500 mg | INHALATION_SOLUTION | RESPIRATORY_TRACT | Status: DC | PRN

## 2019-03-14 MED ORDER — LORAZEPAM 2 MG/ML IJ SOLN
1.0000 mg | Freq: Once | INTRAMUSCULAR | Status: AC
  Administered 2019-03-14: 1 mg via INTRAVENOUS
  Filled 2019-03-14: qty 1

## 2019-03-14 MED ORDER — ACETAMINOPHEN 325 MG PO TABS
650.0000 mg | ORAL_TABLET | Freq: Four times a day (QID) | ORAL | Status: DC | PRN
  Administered 2019-03-16: 650 mg via ORAL
  Filled 2019-03-14: qty 2

## 2019-03-14 MED ORDER — ENOXAPARIN SODIUM 40 MG/0.4ML ~~LOC~~ SOLN
40.0000 mg | SUBCUTANEOUS | Status: DC
  Administered 2019-03-14 – 2019-03-15 (×2): 40 mg via SUBCUTANEOUS
  Filled 2019-03-14 (×2): qty 0.4

## 2019-03-14 MED ORDER — IPRATROPIUM BROMIDE 0.02 % IN SOLN: 0.5000 mg | RESPIRATORY_TRACT | Status: DC | PRN

## 2019-03-14 MED ORDER — DIAZEPAM 5 MG PO TABS
10.0000 mg | ORAL_TABLET | Freq: Once | ORAL | Status: AC
  Administered 2019-03-14: 10 mg via ORAL
  Filled 2019-03-14: qty 2

## 2019-03-14 MED ORDER — INSULIN ASPART 100 UNIT/ML ~~LOC~~ SOLN: 0.0000 [IU] | Freq: Three times a day (TID) | SUBCUTANEOUS | Status: DC

## 2019-03-14 MED ORDER — METHYLPREDNISOLONE SODIUM SUCC 40 MG IJ SOLR
40.0000 mg | Freq: Three times a day (TID) | INTRAMUSCULAR | Status: DC
  Administered 2019-03-14 – 2019-03-15 (×2): 40 mg via INTRAVENOUS
  Filled 2019-03-14 (×2): qty 1

## 2019-03-14 MED ORDER — IPRATROPIUM BROMIDE 0.02 % IN SOLN
0.5000 mg | Freq: Three times a day (TID) | RESPIRATORY_TRACT | Status: DC
  Administered 2019-03-14: 0.5 mg via RESPIRATORY_TRACT
  Filled 2019-03-14: qty 2.5

## 2019-03-14 MED ORDER — ALBUTEROL SULFATE HFA 108 (90 BASE) MCG/ACT IN AERS
2.0000 | INHALATION_SPRAY | RESPIRATORY_TRACT | Status: AC
  Administered 2019-03-14 (×3): 2 via RESPIRATORY_TRACT
  Filled 2019-03-14: qty 6.7

## 2019-03-14 MED ORDER — ALBUTEROL SULFATE HFA 108 (90 BASE) MCG/ACT IN AERS: 2.0000 | INHALATION_SPRAY | Freq: Once | RESPIRATORY_TRACT | Status: DC

## 2019-03-14 MED ORDER — SODIUM CHLORIDE 0.9 % IV BOLUS
500.0000 mL | Freq: Once | INTRAVENOUS | Status: AC
  Administered 2019-03-14: 500 mL via INTRAVENOUS

## 2019-03-14 MED ORDER — METHYLPREDNISOLONE SODIUM SUCC 125 MG IJ SOLR
125.0000 mg | Freq: Once | INTRAMUSCULAR | Status: AC
  Administered 2019-03-14: 125 mg via INTRAVENOUS
  Filled 2019-03-14: qty 2

## 2019-03-14 MED ORDER — BUDESONIDE 0.5 MG/2ML IN SUSP
0.5000 mg | Freq: Two times a day (BID) | RESPIRATORY_TRACT | Status: DC
  Administered 2019-03-14 – 2019-03-16 (×4): 0.5 mg via RESPIRATORY_TRACT
  Filled 2019-03-14 (×3): qty 2

## 2019-03-14 MED ORDER — IOHEXOL 350 MG/ML SOLN
100.0000 mL | Freq: Once | INTRAVENOUS | Status: AC | PRN
  Administered 2019-03-14: 100 mL via INTRAVENOUS

## 2019-03-14 MED ORDER — LORAZEPAM 1 MG PO TABS: 1.0000 mg | ORAL_TABLET | Freq: Once | ORAL | Status: DC

## 2019-03-14 MED ORDER — INSULIN ASPART 100 UNIT/ML ~~LOC~~ SOLN: 0.0000 [IU] | Freq: Every day | SUBCUTANEOUS | Status: DC

## 2019-03-14 MED ORDER — SENNOSIDES-DOCUSATE SODIUM 8.6-50 MG PO TABS
2.0000 | ORAL_TABLET | Freq: Every evening | ORAL | Status: DC | PRN
  Filled 2019-03-14: qty 2

## 2019-03-14 NOTE — ED Notes (Signed)
Radiology at bedside

## 2019-03-14 NOTE — ED Notes (Signed)
RT at bedside.

## 2019-03-14 NOTE — ED Notes (Signed)
Date and time results received: 03/14/19  1516 (use smartphrase ".now" to insert current time)  Test: PO2 Critical Value: Less 31.0  Name of Provider Notified: Dr Pilar Plate Orders Received? Or Actions Taken?:NA

## 2019-03-14 NOTE — Progress Notes (Signed)
RN paged Merdis Delay, NP to make her aware patient is having respirations of 32, c/o pain with deep breaths, otherwise assessment is negative.  RN informed NP that patient had received Ativan in ED.  New orders received.  RN will continue to monitor patient and make provider aware of any changes as needed.  P.J. Henderson Newcomer, RN

## 2019-03-14 NOTE — ED Notes (Signed)
ED TO INPATIENT HANDOFF REPORT  ED Nurse Name and Phone #: [phone]  S Name/Age/Gender Wayne Rowe 58 y.o. male Room/Bed: APA19/APA19  Code Status   Code Status: Full Code  Home/SNF/Other Home Patient oriented to: self, place, time and situation Is this baseline? Yes   Triage Complete: Triage complete  Chief Complaint weak nausea  Triage Note Pt with sob and emesis since Thursday, unknown of fevers at home   Allergies No Known Allergies  Level of Care/Admitting Diagnosis ED Disposition    ED Disposition Condition Comment   Admit  Hospital Area: [facility] [[id number]]  Level of Care: Med-Surg [16]  Covid Evaluation: N/A  Diagnosis: COPD with acute exacerbation [facility]) [[id number]]  Admitting Physician: Stephania Fragmin [facility] [[id number]]  Attending Physician: Stephania Fragmin [facility] [[id number]]  Estimated length of stay: past midnight tomorrow  Certification:: I certify this patient will need inpatient services for at least 2 midnights  PT Class (Do Not Modify): Inpatient [101]  PT Acc Code (Do Not Modify): Private [1]       B Medical/Surgery History Past Medical History:  Diagnosis Date  . Anxiety   . Arthritis   . Back pain   . DDD (degenerative disc disease)    Past Surgical History:  Procedure Laterality Date  . BACK SURGERY       A IV Location/Drains/Wounds Patient Lines/Drains/Airways Status   Active Line/Drains/Airways    Name:   Placement date:   Placement time:   Site:   Days:   Peripheral IV 03/14/19 Right Forearm   03/14/19    1420    Forearm   less than 1          Intake/Output Last 24 hours  Intake/Output Summary (Last 24 hours) at 03/14/2019 1734 Last data filed at 03/14/2019 1503 Gross per 24 hour  Intake 500 ml  Output -  Net 500 ml    Labs/Imaging Results for orders placed or performed during the hospital encounter of 03/14/19 (from the past 48 hour(s))  SARS Coronavirus 2 (CEPHEID- Performed in [facility] hospital lab), Hosp  Order     Status: None   Collection Time: 03/14/19  2:18 PM  Result Value Ref Range   SARS Coronavirus 2 NEGATIVE NEGATIVE    Comment: (NOTE) If result is NEGATIVE SARS-CoV-2 target nucleic acids are NOT DETECTED. The SARS-CoV-2 RNA is generally detectable in upper and lower  respiratory specimens during the acute phase of infection. The lowest  concentration of SARS-CoV-2 viral copies this assay can detect is 250  copies / mL. A negative result does not preclude SARS-CoV-2 infection  and should not be used as the sole basis for treatment or other  patient management decisions.  A negative result may occur with  improper specimen collection / handling, submission of specimen other  than nasopharyngeal swab, presence of viral mutation(s) within the  areas targeted by this assay, and inadequate number of viral copies  (<250 copies / mL). A negative result must be combined with clinical  observations, patient history, and epidemiological information. If result is POSITIVE SARS-CoV-2 target nucleic acids are DETECTED. The SARS-CoV-2 RNA is generally detectable in upper and lower  respiratory specimens dur ing the acute phase of infection.  Positive  results are indicative of active infection with SARS-CoV-2.  Clinical  correlation with patient history and other diagnostic information is  necessary to determine patient infection status.  Positive results do  not rule out bacterial infection or co-infection with other viruses. If result  is PRESUMPTIVE POSTIVE SARS-CoV-2 nucleic acids MAY BE PRESENT.   A presumptive positive result was obtained on the submitted specimen  and confirmed on repeat testing.  While 2019 novel coronavirus  (SARS-CoV-2) nucleic acids may be present in the submitted sample  additional confirmatory testing may be necessary for epidemiological  and / or clinical management purposes  to differentiate between  SARS-CoV-2 and other Sarbecovirus currently known to  infect humans.  If clinically indicated additional testing with an alternate test  methodology [phone]) is advised. The SARS-CoV-2 RNA is generally  detectable in upper and lower respiratory sp ecimens during the acute  phase of infection. The expected result is Negative. Fact Sheet for Patients:  BoilerBrush.com.cy Fact Sheet for Healthcare Providers: https://pope.com/ This test is not yet approved or cleared by the [place] FDA and has been authorized for detection and/or diagnosis of SARS-CoV-2 by FDA under an Emergency Use Authorization (EUA).  This EUA will remain in effect (meaning this test can be used) for the duration of the COVID-19 declaration under Section 564(b)(1) of the Act, 21 U.S.C. section 360bbb-3(b)(1), unless the authorization is terminated or revoked sooner. Performed at [facility], [address], [place], [place] [address]   CBC     Status: Abnormal   Collection Time: 03/14/19  2:21 PM  Result Value Ref Range   WBC 13.0 (H) 4.0 - 10.5 K/uL   RBC 5.45 4.22 - 5.81 MIL/uL   Hemoglobin 16.5 13.0 - 17.0 g/dL   HCT 81.1 91.4 - 78.2 %   MCV 87.9 80.0 - 100.0 fL   MCH 30.3 26.0 - 34.0 pg   MCHC 34.4 30.0 - 36.0 g/dL   RDW 95.6 21.3 - 08.6 %   Platelets 324 150 - 400 K/uL   nRBC 0.0 0.0 - 0.2 %    Comment: Performed at [facility], [address] [place], [place] [address]  Comprehensive metabolic panel     Status: Abnormal   Collection Time: 03/14/19  2:21 PM  Result Value Ref Range   Sodium 139 135 - 145 mmol/L   Potassium 3.4 (L) 3.5 - 5.1 mmol/L   Chloride 104 98 - 111 mmol/L   CO2 18 (L) 22 - 32 mmol/L   Glucose, Bld 120 (H) 70 - 99 mg/dL   BUN 28 (H) 6 - 20 mg/dL   Creatinine, Ser 9.62 (H) 0.61 - 1.24 mg/dL   Calcium 9.2 8.9 - 95.2 mg/dL   Total Protein 7.7 6.5 - 8.1 g/dL   Albumin 4.5 3.5 - 5.0 g/dL   AST 42 (H) 15 - 41 U/L   ALT 22 0 - 44 U/L   Alkaline Phosphatase 90 38 - 126 U/L   Total  Bilirubin 1.4 (H) 0.3 - 1.2 mg/dL   GFR calc non Af Amer >60 >60 mL/min   GFR calc Af Amer >60 >60 mL/min   Anion gap 17 (H) 5 - 15    Comment: Performed at [facility], [address], [place], [place] [address]  Troponin I - ONCE - STAT     Status: None   Collection Time: 03/14/19  2:21 PM  Result Value Ref Range   Troponin I <0.03 <0.03 ng/mL    Comment: Performed at [facility], [address] [place], [place] [address]  Salicylate level     Status: None   Collection Time: 03/14/19  2:21 PM  Result Value Ref Range   Salicylate Lvl <7.0 2.8 - 30.0 mg/dL    Comment: Performed  at [facility], [address], [place], [place] [address]  D-dimer, quantitative (not at [facility])     Status: None   Collection Time: 03/14/19  2:21 PM  Result Value Ref Range   D-Dimer, Quant <0.27 0.00 - 0.50 ug/mL-FEU    Comment: (NOTE) At the manufacturer cut-off of 0.50 ug/mL FEU, this assay has been documented to exclude PE with a sensitivity and negative predictive value of 97 to 99%.  At this time, this assay has not been approved by the FDA to exclude DVT/VTE. Results should be correlated with clinical presentation. Performed at [facility], [address], [place], [place] [address]   Blood gas, venous (at [facility] and [facility], not at [facility])     Status: Abnormal   Collection Time: 03/14/19  2:54 PM  Result Value Ref Range   FIO2 21.00    pH, Ven 7.572 (H) 7.250 - 7.430   pCO2, Ven 24.0 (L) 44.0 - 60.0 mmHg   pO2, Ven <31.0 (LL) 32.0 - 45.0 mmHg    Comment: CRITICAL RESULT CALLED TO, READ BACK BY AND VERIFIED WITH: Sabeen Piechocki,H ON 03/14/19 AT 1510 BY LOY,C    Bicarbonate 24.8 20.0 - 28.0 mmol/L   Acid-Base Excess 0.1 0.0 - 2.0 mmol/L   O2 Saturation 43.2 %   Patient temperature 36.5     Comment: Performed at [facility], [address] [place], [place] [address]   Ct Angio Chest Pe W Or Wo Contrast  Result Date: 03/14/2019 CLINICAL DATA:  Shortness of breath EXAM: CT ANGIOGRAPHY CHEST WITH CONTRAST  TECHNIQUE: Multidetector CT imaging of the chest was performed using the standard protocol during bolus administration of intravenous contrast. Multiplanar CT image reconstructions and MIPs were obtained to evaluate the vascular anatomy. CONTRAST:  OMNIPAQUE IOHEXOL 350 MG/ML SOLN COMPARISON:  Chest radiograph Mar 14, 2019 FINDINGS: Cardiovascular: There is no demonstrable pulmonary embolus. There is no thoracic aortic aneurysm or dissection. The visualized great vessels appear unremarkable. There is no pericardial effusion or pericardial thickening. Mediastinum/Nodes: Thyroid appears unremarkable. There is no appreciable thoracic adenopathy. There is a small hiatal hernia. Lungs/Pleura: There is apical scarring bilaterally with bullous changes in the apices bilaterally. There are areas of mild atelectatic change in the upper lobes toward the apices. There is no edema or consolidation. On axial slice 61 series 7, there is a nodular opacity abutting the pleura in the posterior segment right upper lobe inferiorly measuring 4 mm. No pleural effusion or pleural thickening evident. Upper Abdomen: Visualized upper abdominal structures appear unremarkable. Musculoskeletal: There is degenerative change in the thoracic spine. There are no blastic or lytic bone lesions. There is vacuum phenomenon in the left sternoclavicular joint. There is no appreciable joint space narrowing or erosion in this area. No chest wall lesions are evident. Review of the MIP images confirms the above findings. IMPRESSION: 1. No demonstrable pulmonary embolus. No thoracic aortic aneurysm or dissection. 2. Bullous disease and scarring in the apices. Mild upper lobe atelectatic change. No edema or consolidation. 3. 4 mm nodular opacity in the right upper lobe posteriorly. No follow-up needed if patient is low-risk. Non-contrast chest CT can be considered in 12 months if patient is high-risk. This recommendation follows the consensus statement:  Guidelines for Management of Incidental Pulmonary Nodules Detected on CT Images: From the Fleischner Society 2017; Radiology 2017; 284:228-243. 4.  No appreciable thoracic adenopathy. 5.  Small hiatal hernia. Electronically Signed   By: Bretta Bang III M.D.   On: 03/14/2019 17:04  Dg Chest Port 1 View  Result Date: 03/14/2019 CLINICAL DATA:  Shortness of breath, emesis EXAM: PORTABLE CHEST 1 VIEW COMPARISON:  12/25/2018 FINDINGS: The heart size and mediastinal contours are within normal limits. Both lungs are clear. The visualized skeletal structures are unremarkable. IMPRESSION: No acute abnormality of the lungs in AP portable projection. No focal airspace opacity. Electronically Signed   By: Lauralyn Primes M.D.   On: 03/14/2019 15:16    Pending Labs Unresulted Labs (From admission, onward)    Start     Ordered   03/21/19 0500  Creatinine, serum  (enoxaparin (LOVENOX)    CrCl >/= 30 ml/min)  Weekly,   R    Comments:  while on enoxaparin therapy    03/14/19 1729   03/15/19 0500  Basic metabolic panel  Daily,   R    Question:  Specimen collection method  Answer:  Lab=Lab collect   03/14/19 1724   03/15/19 0500  Magnesium  Daily,   R    Question:  Specimen collection method  Answer:  Lab=Lab collect   03/14/19 1724   03/15/19 0500  CBC  Daily,   R    Question:  Specimen collection method  Answer:  Lab=Lab collect   03/14/19 1724   03/14/19 1728  HIV antibody (Routine Testing)  Once,   R     03/14/19 1729   03/14/19 1723  Lactate dehydrogenase  Once,   R    Question:  Specimen collection method  Answer:  Lab=Lab collect   03/14/19 1724   03/14/19 1723  Ferritin  Once,   R    Question:  Specimen collection method  Answer:  Lab=Lab collect   03/14/19 1724   03/14/19 1723  C-reactive protein  Once,   R    Question:  Specimen collection method  Answer:  Lab=Lab collect   03/14/19 1724   03/14/19 1722  Brain natriuretic peptide  ONCE - STAT,   R    Question:  Specimen collection  method  Answer:  Lab=Lab collect   03/14/19 1724   03/14/19 1722  Procalcitonin - Baseline  ONCE - STAT,   STAT    Question:  Specimen collection method  Answer:  Lab=Lab collect   03/14/19 1724          Vitals/Pain Today's Vitals   03/14/19 1600 03/14/19 1630 03/14/19 1700 03/14/19 1730  BP: 125/88 121/90 137/80 (!) 130/92  Pulse: (!) 116 99 (!) 103 (!) 111  Resp: (!) 35 (!) 21 16 (!) 24  Temp:      TempSrc:      SpO2: 100% 100% 100% 94%  Weight:      Height:      PainSc:        Isolation Precautions Droplet and Contact precautions  Medications Medications  methylPREDNISolone sodium succinate (SOLU-MEDROL) 40 mg/mL injection 40 mg (has no administration in time range)  levalbuterol (XOPENEX) nebulizer solution 1.25 mg (has no administration in time range)  ipratropium (ATROVENT) nebulizer solution 0.5 mg (has no administration in time range)  budesonide (PULMICORT) nebulizer solution 0.5 mg (has no administration in time range)  senna-docusate (Senokot-S) tablet 2 tablet (has no administration in time range)  polyethylene glycol (MIRALAX / GLYCOLAX) packet 17 g (has no administration in time range)  hydrALAZINE (APRESOLINE) injection 10 mg (has no administration in time range)  potassium chloride SA (K-DUR) CR tablet 40 mEq (has no administration in time range)  enoxaparin (LOVENOX) injection 40 mg (has no administration in  time range)  acetaminophen (TYLENOL) tablet 650 mg (has no administration in time range)    Or  acetaminophen (TYLENOL) suppository 650 mg (has no administration in time range)  insulin aspart (novoLOG) injection 0-9 Units (has no administration in time range)  insulin aspart (novoLOG) injection 0-5 Units (has no administration in time range)  methylPREDNISolone sodium succinate (SOLU-MEDROL) 125 mg/2 mL injection 125 mg (125 mg Intravenous Given 03/14/19 1422)  sodium chloride 0.9 % bolus 500 mL (0 mLs Intravenous Stopped 03/14/19 1503)  albuterol  (VENTOLIN HFA) 108 (90 Base) MCG/ACT inhaler 2 puff (2 puffs Inhalation Given 03/14/19 1450)  LORazepam (ATIVAN) injection 1 mg (1 mg Intravenous Given 03/14/19 1539)  iohexol (OMNIPAQUE) 350 MG/ML injection 100 mL (100 mLs Intravenous Contrast Given 03/14/19 1643)    Mobility walks Low fall risk   Focused Assessments    R Recommendations: See Admitting Provider Note  Report given to:   Additional Notes:

## 2019-03-14 NOTE — H&P (Signed)
History and Physical    Wayne Rowe UJW:[id number] DOB: [birth year] DOA: 03/14/2019  PCP: Elfredia Nevins, MD Patient coming from: Home  Chief Complaint: Shortness of breath  HPI: Wayne Rowe is a 58 y.o. male with medical history significant of chronic pain, tobacco use, COPD came to the hospital with complains of shortness of breath.  Patient states that shortness of breath started about 4 days ago along with some nausea and nonbloody vomiting.  Reports his shortness of breath was worse enough that he was unable to eat much and keep his food down.  He tried using home bronchodilators without much help.  At first he thought it would go away but due to failure of improvement he came to the ER today.  In the meantime he denies any fever, chills, lower extremity swelling, orthopnea or other complaints.  Denies any close contact with any confirmed COVID personnel. In the ER initially patient was quite tachypneic.  His labs showed hypokalemia, slightly elevated WBC of 13.0.  D-dimer was negative but CTA chest was done which was negative for pulmonary embolism.  ABG showed respiratory alkalosis secondary to tachypnea.  Was placed on CPAP by the ER provider.  Medical team was requested to admit the patient.  When I saw the patient he was more comfortable sitting on the bed off his CPAP.  He did have some shortness of breath with minimal exertion.  Review of Systems: As per HPI otherwise 10 point review of systems negative.  Review of Systems Otherwise negative except as per HPI, including: General: Denies fever, chills, night sweats or unintended weight loss. Resp: Denies cough, wheezing Cardiac: Denies chest pain, palpitations, orthopnea, paroxysmal nocturnal dyspnea. GI: Denies abdominal pain, nausea, vomiting, diarrhea or constipation GU: Denies dysuria, frequency, hesitancy or incontinence MS: Denies muscle aches, joint pain or swelling Neuro: Denies headache, neurologic deficits (focal  weakness, numbness, tingling), abnormal gait Psych: Denies anxiety, depression, SI/HI/AVH Skin: Denies new rashes or lesions ID: Denies sick contacts, exotic exposures, travel  Past Medical History:  Diagnosis Date  . Anxiety   . Arthritis   . Back pain   . DDD (degenerative disc disease)     Past Surgical History:  Procedure Laterality Date  . BACK SURGERY      SOCIAL HISTORY:  reports that he has been smoking cigarettes. He has been smoking about 1.00 pack per day. He has never used smokeless tobacco. He reports previous alcohol use. He reports current drug use. Drug: Marijuana.  No Known Allergies  FAMILY HISTORY: History reviewed. No pertinent family history.   Prior to Admission medications   Medication Sig Start Date End Date Taking? Authorizing Provider  celecoxib (CELEBREX) 200 MG capsule Take 200 mg by mouth daily.   Yes [provider]  clotrimazole-betamethasone (LOTRISONE) cream Apply 1 application topically 2 (two) times daily.  02/24/19  Yes [provider]  Oxycodone HCl 10 MG TABS Take 10 mg by mouth 2 (two) times a day.  12/15/18  Yes [provider]  pregabalin (LYRICA) 200 MG capsule Take 200 mg by mouth 3 (three) times daily.   Yes [provider]  tamsulosin (FLOMAX) 0.4 MG CAPS capsule Take 0.4 mg by mouth 2 (two) times daily.  04/12/15  Yes [provider]  busPIRone (BUSPAR) 10 MG tablet Take 1 tablet by mouth 2 (two) times a day. 11/02/18   [provider]  carisoprodol (SOMA) 350 MG tablet Take 350 mg by mouth 4 (four) times daily as needed  for muscle spasms.    [provider]  diazepam (VALIUM) 10 MG tablet Take 10 mg by mouth 4 (four) times daily.     [provider]  HYDROcodone-acetaminophen (NORCO) 10-325 MG per tablet Take 1 tablet by mouth every 6 (six) hours as needed for pain.    [provider]  lidocaine-prilocaine (EMLA) cream Apply 2-3 g topically 4 (four) times  daily as needed. 11/24/18   [provider]  ondansetron (ZOFRAN) 4 MG tablet Take 1 tablet (4 mg total) by mouth every 6 (six) hours as needed for nausea or vomiting. 12/26/18   Gilda CreasePollina, Christopher J, MD  pantoprazole (PROTONIX) 40 MG tablet Take 1 tablet (40 mg total) by mouth daily. 12/26/18   Gilda CreasePollina, Christopher J, MD  Polyvinyl Alcohol-Povidone (REFRESH OP) Place 1 drop into both eyes 3 (three) times daily.    [provider]  sucralfate (CARAFATE) 1 g tablet Take 1 tablet (1 g total) by mouth 4 (four) times daily -  with meals and at bedtime. 12/26/18   Gilda CreasePollina, Christopher J, MD  VENTOLIN HFA 108 (90 Base) MCG/ACT inhaler Inhale 1-2 puffs into the lungs every 6 (six) hours as needed for wheezing or shortness of breath.  03/08/19   [provider]    Physical Exam: Vitals:   03/14/19 1600 03/14/19 1630 03/14/19 1700 03/14/19 1730  BP: 125/88 121/90 137/80 (!) 130/92  Pulse: (!) 116 99 (!) 103 (!) 111  Resp: (!) 35 (!) 21 16 (!) 24  Temp:      TempSrc:      SpO2: 100% 100% 100% 94%  Weight:      Height:          Constitutional: NAD, calm, comfortable Eyes: PERRL, lids and conjunctivae normal ENMT: Mucous membranes are moist. Posterior pharynx clear of any exudate or lesions.Normal dentition.  Neck: normal, supple, no masses, no thyromegaly Respiratory: Mild bilateral coarse breath sounds Cardiovascular: Regular rate and rhythm, no murmurs / rubs / gallops. No extremity edema. 2+ pedal pulses. No carotid bruits.  Abdomen: no tenderness, no masses palpated. No hepatosplenomegaly. Bowel sounds positive.  Musculoskeletal: no clubbing / cyanosis. No joint deformity upper and lower extremities. Good ROM, no contractures. Normal muscle tone.  Skin: no rashes, lesions, ulcers. No induration Neurologic: CN 2-12 grossly intact. Sensation intact, DTR normal. Strength 5/5 in all 4.  Psychiatric: Normal judgment and insight. Alert and oriented x 3. Normal mood.      Labs on Admission: I have personally reviewed following labs and imaging studies  CBC: Recent Labs  Lab 03/14/19 1421  WBC 13.0*  HGB 16.5  HCT 47.9  MCV 87.9  PLT 324   Basic Metabolic Panel: Recent Labs  Lab 03/14/19 1421  NA 139  K 3.4*  CL 104  CO2 18*  GLUCOSE 120*  BUN 28*  CREATININE 1.26*  CALCIUM 9.2   GFR: Estimated Creatinine Clearance: 65.6 mL/min (A) (by C-G formula based on SCr of 1.26 mg/dL (H)). Liver Function Tests: Recent Labs  Lab 03/14/19 1421  AST 42*  ALT 22  ALKPHOS 90  BILITOT 1.4*  PROT 7.7  ALBUMIN 4.5   No results for input(s): LIPASE, AMYLASE in the last 168 hours. No results for input(s): AMMONIA in the last 168 hours. Coagulation Profile: No results for input(s): INR, PROTIME in the last 168 hours. Cardiac Enzymes: Recent Labs  Lab 03/14/19 1421  TROPONINI <0.03   BNP (last 3 results) No results for input(s): PROBNP in the last  8760 hours. HbA1C: No results for input(s): HGBA1C in the last 72 hours. CBG: No results for input(s): GLUCAP in the last 168 hours. Lipid Profile: No results for input(s): CHOL, HDL, LDLCALC, TRIG, CHOLHDL, LDLDIRECT in the last 72 hours. Thyroid Function Tests: No results for input(s): TSH, T4TOTAL, FREET4, T3FREE, THYROIDAB in the last 72 hours. Anemia Panel: No results for input(s): VITAMINB12, FOLATE, FERRITIN, TIBC, IRON, RETICCTPCT in the last 72 hours. Urine analysis:    Component Value Date/Time   COLORURINE COLORLESS (A) 11/28/2016 1100   APPEARANCEUR CLEAR 11/28/2016 1100   LABSPEC 1.002 (L) 11/28/2016 1100   PHURINE 7.0 11/28/2016 1100   GLUCOSEU NEGATIVE 11/28/2016 1100   HGBUR NEGATIVE 11/28/2016 1100   BILIRUBINUR NEGATIVE 11/28/2016 1100   KETONESUR NEGATIVE 11/28/2016 1100   PROTEINUR NEGATIVE 11/28/2016 1100   NITRITE NEGATIVE 11/28/2016 1100   LEUKOCYTESUR NEGATIVE 11/28/2016 1100   Sepsis Labs: !!!!!!!!!!!!!!!!!!!!!!!!!!!!!!!!!!!!!!!!!!!!  (procalcitonin:4,lacticidven:4) ) Recent Results (from the past 240 hour(s))  SARS Coronavirus 2 (CEPHEID- Performed in [facility] hospital lab), Hosp Order     Status: None   Collection Time: 03/14/19  2:18 PM  Result Value Ref Range Status   SARS Coronavirus 2 NEGATIVE NEGATIVE Final    Comment: (NOTE) If result is NEGATIVE SARS-CoV-2 target nucleic acids are NOT DETECTED. The SARS-CoV-2 RNA is generally detectable in upper and lower  respiratory specimens during the acute phase of infection. The lowest  concentration of SARS-CoV-2 viral copies this assay can detect is 250  copies / mL. A negative result does not preclude SARS-CoV-2 infection  and should not be used as the sole basis for treatment or other  patient management decisions.  A negative result may occur with  improper specimen collection / handling, submission of specimen other  than nasopharyngeal swab, presence of viral mutation(s) within the  areas targeted by this assay, and inadequate number of viral copies  (<250 copies / mL). A negative result must be combined with clinical  observations, patient history, and epidemiological information. If result is POSITIVE SARS-CoV-2 target nucleic acids are DETECTED. The SARS-CoV-2 RNA is generally detectable in upper and lower  respiratory specimens dur ing the acute phase of infection.  Positive  results are indicative of active infection with SARS-CoV-2.  Clinical  correlation with patient history and other diagnostic information is  necessary to determine patient infection status.  Positive results do  not rule out bacterial infection or co-infection with other viruses. If result is PRESUMPTIVE POSTIVE SARS-CoV-2 nucleic acids MAY BE PRESENT.   A presumptive positive result was obtained on the submitted specimen  and confirmed on repeat testing.  While 2019 novel coronavirus  (SARS-CoV-2) nucleic acids may be present in the submitted sample  additional  confirmatory testing may be necessary for epidemiological  and / or clinical management purposes  to differentiate between  SARS-CoV-2 and other Sarbecovirus currently known to infect humans.  If clinically indicated additional testing with an alternate test  methodology [phone]) is advised. The SARS-CoV-2 RNA is generally  detectable in upper and lower respiratory sp ecimens during the acute  phase of infection. The expected result is Negative. Fact Sheet for Patients:  BoilerBrush.com.cy Fact Sheet for Healthcare Providers: https://pope.com/ This test is not yet approved or cleared by the [place] FDA and has been authorized for detection and/or diagnosis of SARS-CoV-2 by FDA under an Emergency Use Authorization (EUA).  This EUA will remain in effect (meaning this test can be used) for the duration of the COVID-19 declaration  under Section 564(b)(1) of the Act, 21 U.S.C. section 360bbb-3(b)(1), unless the authorization is terminated or revoked sooner. Performed at [facility], [address], [place], [place] [address]      Radiological Exams on Admission: Ct Angio Chest Pe W Or Wo Contrast  Result Date: 03/14/2019 CLINICAL DATA:  Shortness of breath EXAM: CT ANGIOGRAPHY CHEST WITH CONTRAST TECHNIQUE: Multidetector CT imaging of the chest was performed using the standard protocol during bolus administration of intravenous contrast. Multiplanar CT image reconstructions and MIPs were obtained to evaluate the vascular anatomy. CONTRAST:  OMNIPAQUE IOHEXOL 350 MG/ML SOLN COMPARISON:  Chest radiograph Mar 14, 2019 FINDINGS: Cardiovascular: There is no demonstrable pulmonary embolus. There is no thoracic aortic aneurysm or dissection. The visualized great vessels appear unremarkable. There is no pericardial effusion or pericardial thickening. Mediastinum/Nodes: Thyroid appears unremarkable. There is no appreciable thoracic adenopathy.  There is a small hiatal hernia. Lungs/Pleura: There is apical scarring bilaterally with bullous changes in the apices bilaterally. There are areas of mild atelectatic change in the upper lobes toward the apices. There is no edema or consolidation. On axial slice 61 series 7, there is a nodular opacity abutting the pleura in the posterior segment right upper lobe inferiorly measuring 4 mm. No pleural effusion or pleural thickening evident. Upper Abdomen: Visualized upper abdominal structures appear unremarkable. Musculoskeletal: There is degenerative change in the thoracic spine. There are no blastic or lytic bone lesions. There is vacuum phenomenon in the left sternoclavicular joint. There is no appreciable joint space narrowing or erosion in this area. No chest wall lesions are evident. Review of the MIP images confirms the above findings. IMPRESSION: 1. No demonstrable pulmonary embolus. No thoracic aortic aneurysm or dissection. 2. Bullous disease and scarring in the apices. Mild upper lobe atelectatic change. No edema or consolidation. 3. 4 mm nodular opacity in the right upper lobe posteriorly. No follow-up needed if patient is low-risk. Non-contrast chest CT can be considered in 12 months if patient is high-risk. This recommendation follows the consensus statement: Guidelines for Management of Incidental Pulmonary Nodules Detected on CT Images: From the Fleischner Society 2017; Radiology 2017; 284:228-243. 4.  No appreciable thoracic adenopathy. 5.  Small hiatal hernia. Electronically Signed   By: Bretta Bang III M.D.   On: 03/14/2019 17:04   Dg Chest Port 1 View  Result Date: 03/14/2019 CLINICAL DATA:  Shortness of breath, emesis EXAM: PORTABLE CHEST 1 VIEW COMPARISON:  12/25/2018 FINDINGS: The heart size and mediastinal contours are within normal limits. Both lungs are clear. The visualized skeletal structures are unremarkable. IMPRESSION: No acute abnormality of the lungs in AP portable  projection. No focal airspace opacity. Electronically Signed   By: Lauralyn Primes M.D.   On: 03/14/2019 15:16     All images have been reviewed by me personally.    Assessment/Plan Principal Problem:   COPD with acute exacerbation (HCC) Active Problems:   Acute respiratory distress   Respiratory alkalosis   Hypokalemia   Pulmonary nodule    Acute respiratory distress requiring NIPPV Acute COPD exacerbation, severe.  Active tobacco use - Admit patient to the hospital, initially required CPAP in the ER by the ER provider and then was able to be weaned off.  D-dimer negative, CTA chest negative for pulmonary embolism but shows 4 mm nodular opacity in the right upper lobe posteriorly.  COVID test-negative - Start Solu-Medrol every 8 hours, bronchodilators scheduled and as necessary, Pulmicort twice daily -Incentive spirometer and flutter valve.  Supplemental oxygen as  needed -Check BNP, check procalcitonin. -Counseled to quit smoking cigarettes  Respiratory alkalosis, acute -This is secondary to tachypnea which has improved when placed on CPAP in the ER by the ER provider.  Currently weaned off.  More comfortable now.  Will repeat VBG to ensure this is improved.  Hypokalemia -Repletion ordered  Sinus tachycardia -This is secondary to multiple breathing treatment.  Will change treatment plan to Xopenex plus ipratropium.  4 mm pulmonary nodule -Repeat CT in about 6 months and follow-up outpatient with primary care provider.  History of chronic pain - On multiple pain meds at home, I have spoken with pharmacy tech who will confirm these medications either later today or tomorrow and will reach out to the attending physician.  They are unable to get in touch with the outpatient pharmacy at this time.   DVT prophylaxis: Lovenox Code Status: Full code Family Communication: None Disposition Plan: To be determined Consults called: None Admission status: Inpatient admission   Time  Spent: 65 minutes.  >50% of the time was devoted to discussing the patients care, assessment, plan and disposition with other care givers along with counseling the patient about the risks and benefits of treatment.    Collen Vincent Joline Maxcy MD [facility]  If 7PM-7AM, please contact night-coverage [email or web address]  03/14/2019, 5:48 PM

## 2019-03-14 NOTE — ED Notes (Signed)
ED Provider at bedside. 

## 2019-03-14 NOTE — ED Provider Notes (Signed)
[facility] Emergency Department Provider Note MRN:  [id number]  Arrival date & time: 03/14/19     Chief Complaint   Shortness of Breath   History of Present Illness   Wayne Rowe is a 58 y.o. year-old male with a history of COPD presenting to the ED with chief complaint of shortness of breath.  2 days of gradual onset progressively worsening shortness of breath.  Also endorsing central chest tightness.  Denies fever.  Endorsing increased cough.  Denies abdominal pain.  No leg pain or swelling.  I was unable to obtain an accurate HPI, PMH, or ROS due to the patient's respiratory distress.  Review of Systems  Positive for chest pain, shortness of breath, cough.  Patient's Health History    Past Medical History:  Diagnosis Date  . Anxiety   . Arthritis   . Back pain   . DDD (degenerative disc disease)     Past Surgical History:  Procedure Laterality Date  . BACK SURGERY      History reviewed. No pertinent family history.  Social History   Socioeconomic History  . Marital status: Divorced    Spouse name: Not on file  . Number of children: Not on file  . Years of education: Not on file  . Highest education level: Not on file  Occupational History  . Not on file  Social Needs  . Financial resource strain: Not on file  . Food insecurity:    Worry: Not on file    Inability: Not on file  . Transportation needs:    Medical: Not on file    Non-medical: Not on file  Tobacco Use  . Smoking status: Current Every Day Smoker    Packs/day: 1.00    Types: Cigarettes  . Smokeless tobacco: Never Used  Substance and Sexual Activity  . Alcohol use: Not Currently  . Drug use: Yes    Types: Marijuana    Comment: denies use 03/14/19  . Sexual activity: Not on file  Lifestyle  . Physical activity:    Days per week: Not on file    Minutes per session: Not on file  . Stress: Not on file  Relationships  . Social connections:    Talks on phone: Not on file     Gets together: Not on file    Attends religious service: Not on file    Active member of club or organization: Not on file    Attends meetings of clubs or organizations: Not on file    Relationship status: Not on file  . Intimate partner violence:    Fear of current or ex partner: Not on file    Emotionally abused: Not on file    Physically abused: Not on file    Forced sexual activity: Not on file  Other Topics Concern  . Not on file  Social History Narrative  . Not on file     Physical Exam  Vital Signs and Nursing Notes reviewed Vitals:   03/14/19 1630 03/14/19 1700  BP: 121/90 137/80  Pulse: 99 (!) 103  Resp: (!) 21 16  Temp:    SpO2: 100% 100%    CONSTITUTIONAL: Chronically ill-appearing, NAD NEURO:  Alert and oriented x 3, no focal deficits EYES:  eyes equal and reactive ENT/NECK:  no LAD, no JVD CARDIO: Tachycardic rate, well-perfused, normal S1 and S2 PULM: Diffuse wheezing GI/GU:  normal bowel sounds, non-distended, non-tender MSK/SPINE:  No gross deformities, no edema SKIN:  no rash,  atraumatic PSYCH:  Appropriate speech and behavior  Diagnostic and Interventional Summary    EKG Interpretation  Date/Time:  Monday Mar 14 2019 14:16:52 EDT Ventricular Rate:  110 PR Interval:    QRS Duration: 90 QT Interval:  339 QTC Calculation: 459 R Axis:   60 Text Interpretation:  Sinus tachycardia LAE, consider biatrial enlargement Minimal ST depression, inferior leads Confirmed by Geoffery Lyons ([id number]) on 03/14/2019 2:20:24 PM      Labs Reviewed  CBC - Abnormal; Notable for the following components:      Result Value   WBC 13.0 (*)    All other components within normal limits  COMPREHENSIVE METABOLIC PANEL - Abnormal; Notable for the following components:   Potassium 3.4 (*)    CO2 18 (*)    Glucose, Bld 120 (*)    BUN 28 (*)    Creatinine, Ser 1.26 (*)    AST 42 (*)    Total Bilirubin 1.4 (*)    Anion gap 17 (*)    All other components within normal  limits  BLOOD GAS, VENOUS - Abnormal; Notable for the following components:   pH, Ven 7.572 (*)    pCO2, Ven 24.0 (*)    pO2, Ven <31.0 (*)    All other components within normal limits  SARS CORONAVIRUS 2 (HOSPITAL ORDER, PERFORMED IN [facility])  TROPONIN I  SALICYLATE LEVEL  D-DIMER, QUANTITATIVE (NOT AT [facility])    CT ANGIO CHEST PE W OR WO CONTRAST  Final Result    DG Chest Port 1 View  Final Result      Medications  methylPREDNISolone sodium succinate (SOLU-MEDROL) 125 mg/2 mL injection 125 mg (125 mg Intravenous Given 03/14/19 1422)  sodium chloride 0.9 % bolus 500 mL (0 mLs Intravenous Stopped 03/14/19 1503)  albuterol (VENTOLIN HFA) 108 (90 Base) MCG/ACT inhaler 2 puff (2 puffs Inhalation Given 03/14/19 1450)  LORazepam (ATIVAN) injection 1 mg (1 mg Intravenous Given 03/14/19 1539)  iohexol (OMNIPAQUE) 350 MG/ML injection 100 mL (100 mLs Intravenous Contrast Given 03/14/19 1643)     Procedures   EMERGENCY DEPARTMENT [place] CARDIAC EXAM "Study: Limited Ultrasound of the Heart and Pericardium"  INDICATIONS:Abnormal vital signs, Chest pain and Dyspnea Multiple views of the heart and pericardium were obtained in real-time with a multi-frequency probe.  PERFORMED VF:IEPPIR IMAGES ARCHIVED?: No LIMITATIONS:  Body habitus VIEWS USED: Subcostal 4 chamber, Parasternal long axis, Parasternal short axis and Apical 4 chamber  INTERPRETATION: Normal contractility and otherwise a difficult study due to habitus and poor acoustic windows.   Critical Care Critical Care Documentation Critical care time provided by me (excluding procedures): 44 minutes  Condition necessitating critical care: acute respiratory failure and profound respiratory alkalosis  Components of critical care management: reviewing of prior records, laboratory and imaging interpretation, frequent re-examination and reassessment of vital signs, administration of IV benzodiazepines, noninvasive positive pressure  ventilation, discussion with consulting services.    ED Course and Medical Decision Making  I have reviewed the triage vital signs and the nursing notes.  Pertinent labs & imaging results that were available during my care of the patient were reviewed by me and considered in my medical decision making (see below for details).  Concern for COPD exacerbation in this 58 year old male with history of the same.  Also considering pneumonia, coronavirus, ACS given the chest pain as well.  Will swab for coronavirus from the start to hopefully obtain a negative test, which would allow [place] to begin noninvasive positive pressure ventilation.  Labs and VBG pending.  Clinical Course as of Mar 13 1722  Mon Mar 14, 2019  1542 Patient is with persistent tachypnea, VBG reveals significant respiratory alkalosis.  This is unexpected in a uncomplicated COPD exacerbation, but possible.  This raises the concern for pulmonary embolism.  Bedside ultrasound attempted but difficult windows.  Will pursue CTA imaging.  There were initial delays with initiating noninvasive positive pressure ventilation due to protocols with regard to coronavirus.  He has since tested negative and will be started on CPAP, to hopefully recruit more long and help him breathe slower.   [MB]    Clinical Course User Index [MB] Sabas Sous, MD    CTA is negative for pulmonary embolism.  Salicylate level is negative.  Suspect atypical presentation of COPD, admitted to hospital service for further care.  Elmer Sow. Pilar Plate, MD [facility] Emergency Medicine [facility] [email or web address]  Final Clinical Impressions(s) / ED Diagnoses     ICD-10-CM   1. Acute respiratory failure, unspecified whether with hypoxia or hypercapnia (HCC) J96.00   2. SOB (shortness of breath) R06.02 DG Chest [facility] 1 View    DG Chest [place] 1 View  3. Respiratory alkalosis E87.3   4. Chest pain, unspecified type R07.9     ED Discharge Orders     None         Sabas Sous, MD 03/14/19 1723

## 2019-03-14 NOTE — Progress Notes (Signed)
CRITICAL VALUE ALERT  Critical Value:  From venous blood gas: pH 7.627; PCO2 less than 19.0  Date & Time Notied:  03/14/2019 2021  Provider Notified: Merdis Delay, NP  Orders Received/Actions taken: awaiting response

## 2019-03-14 NOTE — ED Notes (Signed)
RT notified of need for treatments.

## 2019-03-14 NOTE — ED Triage Notes (Signed)
Pt with sob and emesis since Thursday, unknown of fevers at home

## 2019-03-15 LAB — CBC
HCT: 46.4 % (ref 39.0–52.0)
Hemoglobin: 15.5 g/dL (ref 13.0–17.0)
MCH: 29.8 pg (ref 26.0–34.0)
MCHC: 33.4 g/dL (ref 30.0–36.0)
MCV: 89.2 fL (ref 80.0–100.0)
Platelets: 298 10*3/uL (ref 150–400)
RBC: 5.2 MIL/uL (ref 4.22–5.81)
RDW: 13.5 % (ref 11.5–15.5)
WBC: 15.2 10*3/uL — ABNORMAL HIGH (ref 4.0–10.5)
nRBC: 0 % (ref 0.0–0.2)

## 2019-03-15 LAB — BASIC METABOLIC PANEL
Anion gap: 12 (ref 5–15)
BUN: 21 mg/dL — ABNORMAL HIGH (ref 6–20)
CO2: 20 mmol/L — ABNORMAL LOW (ref 22–32)
Calcium: 8.7 mg/dL — ABNORMAL LOW (ref 8.9–10.3)
Chloride: 105 mmol/L (ref 98–111)
Creatinine, Ser: 1.11 mg/dL (ref 0.61–1.24)
GFR calc Af Amer: >60 mL/min (ref >60)
GFR calc non Af Amer: >60 mL/min (ref >60)
Glucose, Bld: 143 mg/dL — ABNORMAL HIGH (ref 70–99)
Potassium: 3.5 mmol/L (ref 3.5–5.1)
Sodium: 137 mmol/L (ref 135–145)

## 2019-03-15 LAB — MAGNESIUM: Magnesium: 2.2 mg/dL (ref 1.7–2.4)

## 2019-03-15 MED ORDER — OXYCODONE HCL 5 MG PO TABS
10.0000 mg | ORAL_TABLET | ORAL | Status: DC | PRN
  Administered 2019-03-15 – 2019-03-16 (×6): 10 mg via ORAL
  Filled 2019-03-15 (×6): qty 2

## 2019-03-15 MED ORDER — METHYLPREDNISOLONE SODIUM SUCC 40 MG IJ SOLR
40.0000 mg | Freq: Two times a day (BID) | INTRAMUSCULAR | Status: DC
  Administered 2019-03-15 – 2019-03-16 (×2): 40 mg via INTRAVENOUS
  Filled 2019-03-15 (×2): qty 1

## 2019-03-15 MED ORDER — ZOLPIDEM TARTRATE 5 MG PO TABS
5.0000 mg | ORAL_TABLET | Freq: Every evening | ORAL | Status: DC | PRN
  Administered 2019-03-15 – 2019-03-16 (×2): 5 mg via ORAL
  Filled 2019-03-15 (×2): qty 1

## 2019-03-15 MED ORDER — DIAZEPAM 5 MG PO TABS: 10.0000 mg | ORAL_TABLET | Freq: Four times a day (QID) | ORAL | Status: DC

## 2019-03-15 MED ORDER — TAMSULOSIN HCL 0.4 MG PO CAPS
0.4000 mg | ORAL_CAPSULE | Freq: Two times a day (BID) | ORAL | Status: DC
  Administered 2019-03-15 – 2019-03-16 (×3): 0.4 mg via ORAL
  Filled 2019-03-15 (×3): qty 1

## 2019-03-15 MED ORDER — BUSPIRONE HCL 5 MG PO TABS
10.0000 mg | ORAL_TABLET | Freq: Two times a day (BID) | ORAL | Status: DC
  Administered 2019-03-15 – 2019-03-16 (×3): 10 mg via ORAL
  Filled 2019-03-15 (×3): qty 2

## 2019-03-15 MED ORDER — HYDROCODONE-ACETAMINOPHEN 10-325 MG PO TABS
1.0000 | ORAL_TABLET | Freq: Four times a day (QID) | ORAL | Status: DC | PRN
  Administered 2019-03-15: 1 via ORAL
  Filled 2019-03-15 (×2): qty 1

## 2019-03-15 MED ORDER — PREGABALIN 75 MG PO CAPS
200.0000 mg | ORAL_CAPSULE | Freq: Three times a day (TID) | ORAL | Status: DC
  Administered 2019-03-15: 200 mg via ORAL
  Filled 2019-03-15: qty 1

## 2019-03-15 MED ORDER — CARISOPRODOL 350 MG PO TABS
350.0000 mg | ORAL_TABLET | Freq: Four times a day (QID) | ORAL | Status: DC | PRN
  Administered 2019-03-15 – 2019-03-16 (×3): 350 mg via ORAL
  Filled 2019-03-15 (×4): qty 1

## 2019-03-15 MED ORDER — PANTOPRAZOLE SODIUM 40 MG PO TBEC
40.0000 mg | DELAYED_RELEASE_TABLET | Freq: Every day | ORAL | Status: DC
  Administered 2019-03-15 – 2019-03-16 (×3): 40 mg via ORAL
  Filled 2019-03-15 (×2): qty 1

## 2019-03-15 MED ORDER — SUCRALFATE 1 G PO TABS
1.0000 g | ORAL_TABLET | Freq: Three times a day (TID) | ORAL | Status: DC
  Administered 2019-03-15 – 2019-03-16 (×4): 1 g via ORAL
  Filled 2019-03-15 (×5): qty 1

## 2019-03-15 MED ORDER — OXYCODONE HCL 10 MG PO TABS: 10.0000 mg | ORAL_TABLET | Freq: Two times a day (BID) | ORAL | Status: DC

## 2019-03-15 MED ORDER — PREGABALIN 75 MG PO CAPS
200.0000 mg | ORAL_CAPSULE | Freq: Two times a day (BID) | ORAL | Status: DC
  Administered 2019-03-15 – 2019-03-16 (×2): 200 mg via ORAL
  Filled 2019-03-15 (×2): qty 1

## 2019-03-15 MED ORDER — OXYCODONE HCL 10 MG PO TABS: 10.0000 mg | ORAL_TABLET | Freq: Three times a day (TID) | ORAL | Status: DC | PRN

## 2019-03-15 MED ORDER — CELECOXIB 100 MG PO CAPS
200.0000 mg | ORAL_CAPSULE | Freq: Every day | ORAL | Status: DC
  Administered 2019-03-15 – 2019-03-16 (×2): 200 mg via ORAL
  Filled 2019-03-15 (×2): qty 2

## 2019-03-15 MED ORDER — LORAZEPAM 2 MG/ML IJ SOLN
1.0000 mg | INTRAMUSCULAR | Status: DC | PRN
  Administered 2019-03-15: 1 mg via INTRAVENOUS
  Filled 2019-03-15: qty 1

## 2019-03-15 MED ORDER — ONDANSETRON HCL 4 MG PO TABS
4.0000 mg | ORAL_TABLET | Freq: Four times a day (QID) | ORAL | Status: DC | PRN
  Administered 2019-03-15: 4 mg via ORAL
  Filled 2019-03-15: qty 1

## 2019-03-15 NOTE — Progress Notes (Signed)
Patient given Ambien @ 0142 per order.  Patient resting quietly at this time. P.J. Henderson Newcomer, RN

## 2019-03-15 NOTE — Progress Notes (Signed)
PROGRESS NOTE    Wayne Rowe  GNF:[id number] DOB: 08/24/1961 DOA: 03/14/2019 PCP: Elfredia Nevins, MD   Brief Narrative:  Per HPI: Wayne Rowe is a 58 y.o. male with medical history significant of chronic pain, tobacco use, COPD came to the hospital with complains of shortness of breath.  Patient states that shortness of breath started about 4 days ago along with some nausea and nonbloody vomiting.  Reports his shortness of breath was worse enough that he was unable to eat much and keep his food down.  He tried using home bronchodilators without much help.  At first he thought it would go away but due to failure of improvement he came to the ER today.  In the meantime he denies any fever, chills, lower extremity swelling, orthopnea or other complaints.  Denies any close contact with any confirmed COVID personnel. In the ER initially patient was quite tachypneic.  His labs showed hypokalemia, slightly elevated WBC of 13.0.  D-dimer was negative but CTA chest was done which was negative for pulmonary embolism.  ABG showed respiratory alkalosis secondary to tachypnea.  Was placed on CPAP by the ER provider.  Medical team was requested to admit the patient.  When I saw the patient he was more comfortable sitting on the bed off his CPAP.  He did have some shortness of breath with minimal exertion.  Patient was admitted with severe respiratory distress secondary to acute COPD exacerbation in the setting of active tobacco abuse.  He was also noted to have some findings of respiratory alkalosis secondary to tachypnea on ABG.  This morning he is improving, but not quite at baseline.  Assessment & Plan:   Principal Problem:   COPD with acute exacerbation (HCC) Active Problems:   Acute respiratory distress   Respiratory alkalosis   Hypokalemia   Pulmonary nodule   Acute respiratory distress requiring NIPPV Acute COPD exacerbation, severe.  Active tobacco use - Continue IV steroids which will  be weaned to 40 mg twice daily today -Continue breathing treatments -We will need to follow-up with pulmonology in the outpatient setting in the near future -Counseled on tobacco cessation  Respiratory alkalosis, acute -No need for further repeat at this time -Clinically with no further respiratory distress and tachypnea is slowly improving  Hypokalemia-resolved -Recheck in a.m.  Sinus tachycardia-improving -This is secondary to multiple breathing treatment.  Will change treatment plan to Xopenex plus ipratropium.  4 mm pulmonary nodule -Repeat CT in about 6 months and follow-up outpatient with primary care provider.  History of chronic pain - Continue pain medications from home   DVT prophylaxis: Lovenox Code Status: Full code Family Communication: None at bedside Disposition Plan: Anticipate discharge in 24 hours if further improved.  Wean steroids today.  Restart home medications.   Consultants:   None  Procedures:   None  Antimicrobials:   None   Subjective: Patient seen and evaluated today with no new acute complaints or concerns. No acute concerns or events noted overnight.  He states that he has an ongoing dry cough and some chest tightness that is improving.  No further wheezing or shortness of breath noted.  He still not quite at baseline.  Objective: Vitals:   03/14/19 2103 03/15/19 0600 03/15/19 0641 03/15/19 0822  BP: 131/86 107/77 112/82   Pulse: (!) 111 (!) 111 91   Resp: (!) Temp: 98.4 F (36.9 C) 98.2 F (36.8 C) 98.1 F (36.7 C)   TempSrc: Oral  Oral Oral   SpO2: 100% 98% 93% 100%  Weight:      Height:        Intake/Output Summary (Last 24 hours) at 03/15/2019 0944 Last data filed at 03/15/2019 0602 Gross per 24 hour  Intake 1460 ml  Output 277 ml  Net 1183 ml   Filed Weights   03/14/19 1359 03/14/19 1850  Weight: 72.6 kg 67.5 kg    Examination:  General exam: Appears calm and comfortable  Respiratory system:  Clear to auscultation. Respiratory effort normal.  Currently on room air Cardiovascular system: S1 & S2 heard, RRR. No JVD, murmurs, rubs, gallops or clicks. No pedal edema. Gastrointestinal system: Abdomen is nondistended, soft and nontender. No organomegaly or masses felt. Normal bowel sounds heard. Central nervous system: Alert and oriented. No focal neurological deficits. Extremities: Symmetric 5 x 5 power. Skin: No rashes, lesions or ulcers Psychiatry: Judgement and insight appear normal. Mood & affect appropriate.     Data Reviewed: I have personally reviewed following labs and imaging studies  CBC: Recent Labs  Lab 03/14/19 1421 03/15/19 0646  WBC 13.0* 15.2*  HGB 16.5 15.5  HCT 47.9 46.4  MCV 87.9 89.2  PLT 324 298   Basic Metabolic Panel: Recent Labs  Lab 03/14/19 1421 03/15/19 0646  NA 139 137  K 3.4* 3.5  CL 104 105  CO2 18* 20*  GLUCOSE 120* 143*  BUN 28* 21*  CREATININE 1.26* 1.11  CALCIUM 9.2 8.7*  MG  --  2.2   GFR: Estimated Creatinine Clearance: 69.3 mL/min (by C-G formula based on SCr of 1.11 mg/dL). Liver Function Tests: Recent Labs  Lab 03/14/19 1421  AST 42*  ALT 22  ALKPHOS 90  BILITOT 1.4*  PROT 7.7  ALBUMIN 4.5   No results for input(s): LIPASE, AMYLASE in the last 168 hours. No results for input(s): AMMONIA in the last 168 hours. Coagulation Profile: No results for input(s): INR, PROTIME in the last 168 hours. Cardiac Enzymes: Recent Labs  Lab 03/14/19 1421  TROPONINI <0.03   BNP (last 3 results) No results for input(s): PROBNP in the last 8760 hours. HbA1C: No results for input(s): HGBA1C in the last 72 hours. CBG: No results for input(s): GLUCAP in the last 168 hours. Lipid Profile: No results for input(s): CHOL, HDL, LDLCALC, TRIG, CHOLHDL, LDLDIRECT in the last 72 hours. Thyroid Function Tests: No results for input(s): TSH, T4TOTAL, FREET4, T3FREE, THYROIDAB in the last 72 hours. Anemia Panel: Recent Labs     03/14/19 1421  FERRITIN 104   Sepsis Labs: Recent Labs  Lab 03/14/19 1421  PROCALCITON <0.10    Recent Results (from the past 240 hour(s))  SARS Coronavirus 2 (CEPHEID- Performed in [facility] hospital lab), Hosp Order     Status: None   Collection Time: 03/14/19  2:18 PM  Result Value Ref Range Status   SARS Coronavirus 2 NEGATIVE NEGATIVE Final    Comment: (NOTE) If result is NEGATIVE SARS-CoV-2 target nucleic acids are NOT DETECTED. The SARS-CoV-2 RNA is generally detectable in upper and lower  respiratory specimens during the acute phase of infection. The lowest  concentration of SARS-CoV-2 viral copies this assay can detect is 250  copies / mL. A negative result does not preclude SARS-CoV-2 infection  and should not be used as the sole basis for treatment or other  patient management decisions.  A negative result may occur with  improper specimen collection / handling, submission of specimen other  than nasopharyngeal swab, presence of  viral mutation(s) within the  areas targeted by this assay, and inadequate number of viral copies  (<250 copies / mL). A negative result must be combined with clinical  observations, patient history, and epidemiological information. If result is POSITIVE SARS-CoV-2 target nucleic acids are DETECTED. The SARS-CoV-2 RNA is generally detectable in upper and lower  respiratory specimens dur ing the acute phase of infection.  Positive  results are indicative of active infection with SARS-CoV-2.  Clinical  correlation with patient history and other diagnostic information is  necessary to determine patient infection status.  Positive results do  not rule out bacterial infection or co-infection with other viruses. If result is PRESUMPTIVE POSTIVE SARS-CoV-2 nucleic acids MAY BE PRESENT.   A presumptive positive result was obtained on the submitted specimen  and confirmed on repeat testing.  While 2019 novel coronavirus  (SARS-CoV-2) nucleic  acids may be present in the submitted sample  additional confirmatory testing may be necessary for epidemiological  and / or clinical management purposes  to differentiate between  SARS-CoV-2 and other Sarbecovirus currently known to infect humans.  If clinically indicated additional testing with an alternate test  methodology [phone]) is advised. The SARS-CoV-2 RNA is generally  detectable in upper and lower respiratory sp ecimens during the acute  phase of infection. The expected result is Negative. Fact Sheet for Patients:  BoilerBrush.com.cy Fact Sheet for Healthcare Providers: https://pope.com/ This test is not yet approved or cleared by the [place] FDA and has been authorized for detection and/or diagnosis of SARS-CoV-2 by FDA under an Emergency Use Authorization (EUA).  This EUA will remain in effect (meaning this test can be used) for the duration of the COVID-19 declaration under Section 564(b)(1) of the Act, 21 U.S.C. section 360bbb-3(b)(1), unless the authorization is terminated or revoked sooner. Performed at [facility], [address] [place], [place] [address]   MRSA PCR Screening     Status: None   Collection Time: 03/14/19  8:22 PM  Result Value Ref Range Status   MRSA by PCR NEGATIVE NEGATIVE Final    Comment:        The GeneXpert MRSA Assay (FDA approved for NASAL specimens only), is one component of a comprehensive MRSA colonization surveillance program. It is not intended to diagnose MRSA infection nor to guide or monitor treatment for MRSA infections. Performed at [facility], [address] [place], [place] [address]          Radiology Studies: Ct Angio Chest Pe W Or Wo Contrast  Result Date: 03/14/2019 CLINICAL DATA:  Shortness of breath EXAM: CT ANGIOGRAPHY CHEST WITH CONTRAST TECHNIQUE: Multidetector CT imaging of the chest was performed using the standard protocol during bolus  administration of intravenous contrast. Multiplanar CT image reconstructions and MIPs were obtained to evaluate the vascular anatomy. CONTRAST:  OMNIPAQUE IOHEXOL 350 MG/ML SOLN COMPARISON:  Chest radiograph Mar 14, 2019 FINDINGS: Cardiovascular: There is no demonstrable pulmonary embolus. There is no thoracic aortic aneurysm or dissection. The visualized great vessels appear unremarkable. There is no pericardial effusion or pericardial thickening. Mediastinum/Nodes: Thyroid appears unremarkable. There is no appreciable thoracic adenopathy. There is a small hiatal hernia. Lungs/Pleura: There is apical scarring bilaterally with bullous changes in the apices bilaterally. There are areas of mild atelectatic change in the upper lobes toward the apices. There is no edema or consolidation. On axial slice 61 series 7, there is a nodular opacity abutting the pleura in the posterior segment right upper lobe inferiorly measuring 4 mm. No  pleural effusion or pleural thickening evident. Upper Abdomen: Visualized upper abdominal structures appear unremarkable. Musculoskeletal: There is degenerative change in the thoracic spine. There are no blastic or lytic bone lesions. There is vacuum phenomenon in the left sternoclavicular joint. There is no appreciable joint space narrowing or erosion in this area. No chest wall lesions are evident. Review of the MIP images confirms the above findings. IMPRESSION: 1. No demonstrable pulmonary embolus. No thoracic aortic aneurysm or dissection. 2. Bullous disease and scarring in the apices. Mild upper lobe atelectatic change. No edema or consolidation. 3. 4 mm nodular opacity in the right upper lobe posteriorly. No follow-up needed if patient is low-risk. Non-contrast chest CT can be considered in 12 months if patient is high-risk. This recommendation follows the consensus statement: Guidelines for Management of Incidental Pulmonary Nodules Detected on CT Images: From the Fleischner  Society 2017; Radiology 2017; 284:228-243. 4.  No appreciable thoracic adenopathy. 5.  Small hiatal hernia. Electronically Signed   By: Bretta Bang III M.D.   On: 03/14/2019 17:04   Dg Chest Port 1 View  Result Date: 03/14/2019 CLINICAL DATA:  Shortness of breath, emesis EXAM: PORTABLE CHEST 1 VIEW COMPARISON:  12/25/2018 FINDINGS: The heart size and mediastinal contours are within normal limits. Both lungs are clear. The visualized skeletal structures are unremarkable. IMPRESSION: No acute abnormality of the lungs in AP portable projection. No focal airspace opacity. Electronically Signed   By: Lauralyn Primes M.D.   On: 03/14/2019 15:16        Scheduled Meds: . budesonide (PULMICORT) nebulizer solution  0.5 mg Nebulization BID  . busPIRone  10 mg Oral BID  . celecoxib  200 mg Oral Daily  . diazepam  10 mg Oral QID  . enoxaparin (LOVENOX) injection  40 mg Subcutaneous Q24H  . methylPREDNISolone (SOLU-MEDROL) injection  40 mg Intravenous Q12H  . Oxycodone HCl  10 mg Oral BID  . pantoprazole  40 mg Oral Daily  . potassium chloride  40 mEq Oral Once  . pregabalin  200 mg Oral TID  . sucralfate  1 g Oral TID WC & HS  . tamsulosin  0.4 mg Oral BID   Continuous Infusions:   LOS: 1 day    Time spent: 30 minutes    Devanny Palecek Hoover Brunette, DO [facility] Pager [phone]  If 7PM-7AM, please contact night-coverage [email or web address] Password [facility] 03/15/2019, 9:44 AM

## 2019-03-15 NOTE — Progress Notes (Deleted)
CRITICAL VALUE ALERT  Critical Value:  Panic values:  pH 7.627, pCO2<19.0  Date & Time Notied:  Wasn't notified saw results at 2115  Provider Notified: Schorr  Orders Received/Actions taken:

## 2019-03-15 NOTE — Progress Notes (Signed)
RN spoke with Merdis Delay, NP earlier in shift regarding patient's venous blood gas results, increased respirations and anxiety.  NP instructed RN to monitor patient.  Patient had been prescribed Valium 10 mg earlier in shift which helped reduce anxiety and respirations for short period of time.  Patient now awake and requesting something to help him rest.  RN paged Merdis Delay, NP again regarding patient's request for sleep medication and awaiting response.  P.J. Henderson Newcomer, RN

## 2019-03-15 NOTE — Plan of Care (Signed)

## 2019-03-16 LAB — CBC
HCT: 43.2 % (ref 39.0–52.0)
Hemoglobin: 13.5 g/dL (ref 13.0–17.0)
MCH: 29.5 pg (ref 26.0–34.0)
MCHC: 31.3 g/dL (ref 30.0–36.0)
MCV: 94.3 fL (ref 80.0–100.0)
Platelets: 264 10*3/uL (ref 150–400)
RBC: 4.58 MIL/uL (ref 4.22–5.81)
RDW: 13.4 % (ref 11.5–15.5)
WBC: 17.1 10*3/uL — ABNORMAL HIGH (ref 4.0–10.5)
nRBC: 0 % (ref 0.0–0.2)

## 2019-03-16 LAB — BASIC METABOLIC PANEL
Anion gap: 11 (ref 5–15)
BUN: 24 mg/dL — ABNORMAL HIGH (ref 6–20)
CO2: 24 mmol/L (ref 22–32)
Calcium: 8.5 mg/dL — ABNORMAL LOW (ref 8.9–10.3)
Chloride: 103 mmol/L (ref 98–111)
Creatinine, Ser: 1.07 mg/dL (ref 0.61–1.24)
GFR calc Af Amer: >60 mL/min (ref >60)
GFR calc non Af Amer: >60 mL/min (ref >60)
Glucose, Bld: 122 mg/dL — ABNORMAL HIGH (ref 70–99)
Potassium: 4.1 mmol/L (ref 3.5–5.1)
Sodium: 138 mmol/L (ref 135–145)

## 2019-03-16 LAB — MAGNESIUM: Magnesium: 2.3 mg/dL (ref 1.7–2.4)

## 2019-03-16 LAB — HIV ANTIBODY (ROUTINE TESTING W REFLEX): HIV Screen 4th Generation wRfx: NONREACTIVE

## 2019-03-16 MED ORDER — ALBUTEROL SULFATE (2.5 MG/3ML) 0.083% IN NEBU: 2.5000 mg | INHALATION_SOLUTION | Freq: Four times a day (QID) | RESPIRATORY_TRACT | 12 refills | Status: DC | PRN

## 2019-03-16 MED ORDER — ACETAMINOPHEN 325 MG PO TABS: 650.0000 mg | ORAL_TABLET | Freq: Four times a day (QID) | ORAL | 0 refills | Status: DC | PRN

## 2019-03-16 MED ORDER — DOXYCYCLINE HYCLATE 100 MG PO TABS: 100.0000 mg | ORAL_TABLET | Freq: Two times a day (BID) | ORAL | 0 refills | Status: AC

## 2019-03-16 MED ORDER — PREDNISONE 20 MG PO TABS: 40.0000 mg | ORAL_TABLET | Freq: Every day | ORAL | 0 refills | Status: DC

## 2019-03-16 MED ORDER — SENNOSIDES-DOCUSATE SODIUM 8.6-50 MG PO TABS: 2.0000 | ORAL_TABLET | Freq: Every day | ORAL | 2 refills | Status: DC

## 2019-03-16 MED ORDER — CELECOXIB 200 MG PO CAPS: 200.0000 mg | ORAL_CAPSULE | Freq: Every day | ORAL | 2 refills | Status: DC

## 2019-03-16 MED ORDER — TIOTROPIUM BROMIDE MONOHYDRATE 18 MCG IN CAPS: 18.0000 ug | ORAL_CAPSULE | Freq: Every day | RESPIRATORY_TRACT | 2 refills | Status: DC

## 2019-03-16 NOTE — Discharge Instructions (Signed)
1) abstinence from smoking strongly advised... You have COPD/emphysema 2) use albuterol nebulizer treatments as needed and as advised 3) start Spiriva inhaler daily 4) follow-up with PCP in 1 to 2 weeks for reevaluation

## 2019-03-16 NOTE — Progress Notes (Signed)
Nsg Discharge Note  Admit Date:  03/14/2019 Discharge date: 03/16/2019   Wayne Rowe to be D/C'd Home per MD order.  AVS completed.  Copy for chart, and copy for patient signed, and dated. Patient/caregiver able to verbalize understanding.  Discharge Medication: Allergies as of 03/16/2019   No Known Allergies     Medication List    TAKE these medications   acetaminophen 325 MG tablet Commonly known as:  TYLENOL Take 2 tablets (650 mg total) by mouth every 6 (six) hours as needed for mild pain (or Fever >/= 101).   carisoprodol 350 MG tablet Commonly known as:  SOMA Take 350 mg by mouth 4 (four) times daily as needed for muscle spasms.   celecoxib 200 MG capsule Commonly known as:  CELEBREX Take 1 capsule (200 mg total) by mouth daily with breakfast. What changed:  when to take this   clotrimazole-betamethasone cream Commonly known as:  LOTRISONE Apply 1 application topically 2 (two) times daily.   doxycycline 100 MG tablet Commonly known as:  VIBRA-TABS Take 1 tablet (100 mg total) by mouth 2 (two) times daily for 5 days.   Oxycodone HCl 10 MG Tabs Take 10 mg by mouth 2 (two) times a day.   pantoprazole 40 MG tablet Commonly known as:  Protonix Take 1 tablet (40 mg total) by mouth daily.   predniSONE 20 MG tablet Commonly known as:  Deltasone Take 2 tablets (40 mg total) by mouth daily with breakfast.   pregabalin 200 MG capsule Commonly known as:  LYRICA Take 200 mg by mouth 2 (two) times daily.   senna-docusate 8.6-50 MG tablet Commonly known as:  Senokot-S Take 2 tablets by mouth at bedtime.   tamsulosin 0.4 MG Caps capsule Commonly known as:  FLOMAX Take 0.8 mg by mouth every evening.   tiotropium 18 MCG inhalation capsule Commonly known as:  Spiriva HandiHaler Place 1 capsule (18 mcg total) into inhaler and inhale daily.   Ventolin HFA 108 (90 Base) MCG/ACT inhaler Generic drug:  albuterol Inhale 1-2 puffs into the lungs every 6 (six) hours as  needed for wheezing or shortness of breath. What changed:  Another medication with the same name was added. Make sure you understand how and when to take each.   albuterol (2.5 MG/3ML) 0.083% nebulizer solution Commonly known as:  PROVENTIL Take 3 mLs (2.5 mg total) by nebulization every 6 (six) hours as needed for wheezing or shortness of breath. Dx---J 44.1 What changed:  You were already taking a medication with the same name, and this prescription was added. Make sure you understand how and when to take each.            Durable Medical Equipment  (From admission, onward)         Start     Ordered   03/16/19 1014  For home use only DME Nebulizer machine  Once    Comments:  Dx---J 44.1  Question:  Patient needs a nebulizer to treat with the following condition  Answer:  COPD (chronic obstructive pulmonary disease) (HCC)   03/16/19 1013          Discharge Assessment: Vitals:   03/16/19 1000 03/16/19 1012  BP:    Pulse:    Resp:    Temp:    SpO2: 97% 98%   Skin clean, dry and intact without evidence of skin break down, no evidence of skin tears noted. IV catheter discontinued intact. Site without signs and symptoms of complications - no  redness or edema noted at insertion site, patient denies c/o pain - only slight tenderness at site.  Dressing with slight pressure applied.  D/c Instructions-Education: Discharge instructions given to patient with verbalized understanding. D/c education completed with patient including follow up instructions, medication list, d/c activities limitations if indicated, with other d/c instructions as indicated by MD - patient able to verbalize understanding, all questions fully answered. Patient instructed to return to ED, call 911, or call MD for any changes in condition.  Patient escorted via WC, and D/C home via private auto.  Leila Schuff C, RN 03/16/2019 11:30 AM

## 2019-03-16 NOTE — Discharge Summary (Signed)
Wayne Rowe, is a 58 y.o. male  DOB 1961-01-22  MRN [id number].  Admission date:  03/14/2019  Admitting Physician  Dimple Nanas, MD  Discharge Date:  03/16/2019   Primary MD  Elfredia Nevins, MD  Recommendations for primary care physician for things to follow:   1) abstinence from smoking strongly advised... You have COPD/emphysema 2) use albuterol nebulizer treatments as needed and as advised 3) start Spiriva inhaler daily 4) follow-up with PCP in 1 to 2 weeks for reevaluation   Admission Diagnosis  Respiratory alkalosis [E87.3] SOB (shortness of breath) [R06.02] Acute respiratory failure, unspecified whether with hypoxia or hypercapnia (HCC) [J96.00] Chest pain, unspecified type [R07.9]   Discharge Diagnosis  Respiratory alkalosis [E87.3] SOB (shortness of breath) [R06.02] Acute respiratory failure, unspecified whether with hypoxia or hypercapnia (HCC) [J96.00] Chest pain, unspecified type [R07.9]    Principal Problem:   COPD with acute exacerbation (HCC) Active Problems:   Acute respiratory distress   Respiratory alkalosis   Hypokalemia   Pulmonary nodule      Past Medical History:  Diagnosis Date   Anxiety    Arthritis    Back pain    DDD (degenerative disc disease)     Past Surgical History:  Procedure Laterality Date   BACK SURGERY       HPI  from the history and physical done on the day of admission:     Chief Complaint: Shortness of breath  HPI: Wayne Rowe is a 57 y.o. male with medical history significant of chronic pain, tobacco use, COPD came to the hospital with complains of shortness of breath.  Patient states that shortness of breath started about 4 days ago along with some nausea and nonbloody vomiting.  Reports his shortness of breath was worse enough that he was unable to eat much and keep his food down.  He tried using home bronchodilators  without much help.  At first he thought it would go away but due to failure of improvement he came to the ER today.  In the meantime he denies any fever, chills, lower extremity swelling, orthopnea or other complaints.  Denies any close contact with any confirmed COVID personnel. In the ER initially patient was quite tachypneic.  His labs showed hypokalemia, slightly elevated WBC of 13.0.  D-dimer was negative but CTA chest was done which was negative for pulmonary embolism.  ABG showed respiratory alkalosis secondary to tachypnea.  Was placed on CPAP by the ER provider.  Medical team was requested to admit the patient.  When I saw the patient he was more comfortable sitting on the bed off his CPAP.  He did have some shortness of breath with minimal exertion.   Hospital Course:   Brief Narrative:  Per HPI: Wayne Rowe a 58 y.o.malewith medical history significant ofchronic pain, tobacco use, COPD came to the hospital with complains of shortness of breath. Patient states that shortness of breath started about 4 days ago along with some nausea and nonbloody vomiting. Reports his shortness  of breath was worse enough that he was unable to eat much and keep his food down. He tried using home bronchodilators without much help. At first he thought it would go away but due to failure of improvement he came to the ER today. In the meantime he denies any fever, chills, lower extremity swelling, orthopnea or other complaints. Denies any close contact with any confirmed COVID personnel. In the ER initially patient was quite tachypneic. His labs showed hypokalemia, slightly elevated WBC of 13.0. D-dimer was negative but CTA chest was done which was negative for pulmonary embolism. ABG showed respiratory alkalosis secondary to tachypnea. Was placed on CPAP by the ER provider. Medical team was requested to admit the patient.  When I saw the patient he was more comfortable sitting on the bed  offhis CPAP. He did have some shortness of breath with minimal exertion.  Patient was admitted with severe respiratory distress secondary to acute COPD exacerbation in the setting of active tobacco abuse.  He was also noted to have some findings of respiratory alkalosis secondary to tachypnea on ABG.  This morning he is improving, but not quite at baseline.  Assessment & Plan:   Principal Problem:   COPD with acute exacerbation (HCC) Active Problems:   Acute respiratory distress   Respiratory alkalosis   Hypokalemia   Pulmonary nodule   Acute hypoxic respiratory distress requiringNIPPV/Acute COPD exacerbation, severe--no definite pneumonia, steroid-induced leukocytosis noted, overall resp status is Much improved with steroids and bronchodilators, hypoxia resolved, okay to discharge home on p.o. prednisone, doxycycline and bronchodilators including scheduled Spiriva and as needed albuterol via nebulizer.  Outpatient follow-up with pulmonologist will be desirable  Tobacco abuse--- smoking cessation strongly advised patient is not ready to quit smoking  Hypokalemia and sinus tachycardia--- was most likely due to back-to-back bronchodilator treatments, both now resolved  4 mm pulmonary nodule -Repeat CT in about 6 months and follow-up outpatient with primary care provider.  History of chronic pain - Continue pain medications from home   Discharge Condition: stable  Follow UP--PCP as advised, outpatient follow-up with pulmonologist in the near future would be beneficial  Diet and Activity recommendation:  As advised  Discharge Instructions    Discharge Instructions    Call MD for:  difficulty breathing, headache or visual disturbances   Complete by:  As directed    Call MD for:  persistant dizziness or light-headedness   Complete by:  As directed    Call MD for:  persistant nausea and vomiting   Complete by:  As directed    Call MD for:  temperature >100.4   Complete  by:  As directed    Diet - low sodium heart healthy   Complete by:  As directed    Discharge instructions   Complete by:  As directed    1) abstinence from smoking strongly advised... You have COPD/emphysema 2) use albuterol nebulizer treatments as needed and as advised 3) start Spiriva inhaler daily 4) follow-up with PCP in 1 to 2 weeks for reevaluation   Increase activity slowly   Complete by:  As directed       Discharge Medications     Allergies as of 03/16/2019   No Known Allergies     Medication List    TAKE these medications   acetaminophen 325 MG tablet Commonly known as:  TYLENOL Take 2 tablets (650 mg total) by mouth every 6 (six) hours as needed for mild pain (or Fever >/= 101).   carisoprodol  350 MG tablet Commonly known as:  SOMA Take 350 mg by mouth 4 (four) times daily as needed for muscle spasms.   celecoxib 200 MG capsule Commonly known as:  CELEBREX Take 1 capsule (200 mg total) by mouth daily with breakfast. What changed:  when to take this   clotrimazole-betamethasone cream Commonly known as:  LOTRISONE Apply 1 application topically 2 (two) times daily.   doxycycline 100 MG tablet Commonly known as:  VIBRA-TABS Take 1 tablet (100 mg total) by mouth 2 (two) times daily for 5 days.   Oxycodone HCl 10 MG Tabs Take 10 mg by mouth 2 (two) times a day.   pantoprazole 40 MG tablet Commonly known as:  Protonix Take 1 tablet (40 mg total) by mouth daily.   predniSONE 20 MG tablet Commonly known as:  Deltasone Take 2 tablets (40 mg total) by mouth daily with breakfast.   pregabalin 200 MG capsule Commonly known as:  LYRICA Take 200 mg by mouth 2 (two) times daily.   senna-docusate 8.6-50 MG tablet Commonly known as:  Senokot-S Take 2 tablets by mouth at bedtime.   tamsulosin 0.4 MG Caps capsule Commonly known as:  FLOMAX Take 0.8 mg by mouth every evening.   tiotropium 18 MCG inhalation capsule Commonly known as:  Spiriva HandiHaler Place  1 capsule (18 mcg total) into inhaler and inhale daily.   Ventolin HFA 108 (90 Base) MCG/ACT inhaler Generic drug:  albuterol Inhale 1-2 puffs into the lungs every 6 (six) hours as needed for wheezing or shortness of breath. What changed:  Another medication with the same name was added. Make sure you understand how and when to take each.   albuterol (2.5 MG/3ML) 0.083% nebulizer solution Commonly known as:  PROVENTIL Take 3 mLs (2.5 mg total) by nebulization every 6 (six) hours as needed for wheezing or shortness of breath. Dx---J 44.1 What changed:  You were already taking a medication with the same name, and this prescription was added. Make sure you understand how and when to take each.            Durable Medical Equipment  (From admission, onward)         Start     Ordered   03/16/19 1014  For home use only DME Nebulizer machine  Once    Comments:  Dx---J 44.1  Question:  Patient needs a nebulizer to treat with the following condition  Answer:  COPD (chronic obstructive pulmonary disease) (HCC)   03/16/19 1013          Major procedures and Radiology Reports - PLEASE review detailed and final reports for all details, in brief -   Ct Angio Chest Pe W Or Wo Contrast  Result Date: 03/14/2019 CLINICAL DATA:  Shortness of breath EXAM: CT ANGIOGRAPHY CHEST WITH CONTRAST TECHNIQUE: Multidetector CT imaging of the chest was performed using the standard protocol during bolus administration of intravenous contrast. Multiplanar CT image reconstructions and MIPs were obtained to evaluate the vascular anatomy. CONTRAST:  OMNIPAQUE IOHEXOL 350 MG/ML SOLN COMPARISON:  Chest radiograph Mar 14, 2019 FINDINGS: Cardiovascular: There is no demonstrable pulmonary embolus. There is no thoracic aortic aneurysm or dissection. The visualized great vessels appear unremarkable. There is no pericardial effusion or pericardial thickening. Mediastinum/Nodes: Thyroid appears unremarkable. There is no  appreciable thoracic adenopathy. There is a small hiatal hernia. Lungs/Pleura: There is apical scarring bilaterally with bullous changes in the apices bilaterally. There are areas of mild atelectatic change in the upper lobes  toward the apices. There is no edema or consolidation. On axial slice 61 series 7, there is a nodular opacity abutting the pleura in the posterior segment right upper lobe inferiorly measuring 4 mm. No pleural effusion or pleural thickening evident. Upper Abdomen: Visualized upper abdominal structures appear unremarkable. Musculoskeletal: There is degenerative change in the thoracic spine. There are no blastic or lytic bone lesions. There is vacuum phenomenon in the left sternoclavicular joint. There is no appreciable joint space narrowing or erosion in this area. No chest wall lesions are evident. Review of the MIP images confirms the above findings. IMPRESSION: 1. No demonstrable pulmonary embolus. No thoracic aortic aneurysm or dissection. 2. Bullous disease and scarring in the apices. Mild upper lobe atelectatic change. No edema or consolidation. 3. 4 mm nodular opacity in the right upper lobe posteriorly. No follow-up needed if patient is low-risk. Non-contrast chest CT can be considered in 12 months if patient is high-risk. This recommendation follows the consensus statement: Guidelines for Management of Incidental Pulmonary Nodules Detected on CT Images: From the Fleischner Society 2017; Radiology 2017; 284:228-243. 4.  No appreciable thoracic adenopathy. 5.  Small hiatal hernia. Electronically Signed   By: Bretta Bang III M.D.   On: 03/14/2019 17:04   Dg Chest Port 1 View  Result Date: 03/14/2019 CLINICAL DATA:  Shortness of breath, emesis EXAM: PORTABLE CHEST 1 VIEW COMPARISON:  12/25/2018 FINDINGS: The heart size and mediastinal contours are within normal limits. Both lungs are clear. The visualized skeletal structures are unremarkable. IMPRESSION: No acute abnormality of  the lungs in AP portable projection. No focal airspace opacity. Electronically Signed   By: Lauralyn Primes M.D.   On: 03/14/2019 15:16   Micro Results    Recent Results (from the past 240 hour(s))  SARS Coronavirus 2 (CEPHEID- Performed in [facility] hospital lab), Hosp Order     Status: None   Collection Time: 03/14/19  2:18 PM  Result Value Ref Range Status   SARS Coronavirus 2 NEGATIVE NEGATIVE Final    Comment: (NOTE) If result is NEGATIVE SARS-CoV-2 target nucleic acids are NOT DETECTED. The SARS-CoV-2 RNA is generally detectable in upper and lower  respiratory specimens during the acute phase of infection. The lowest  concentration of SARS-CoV-2 viral copies this assay can detect is 250  copies / mL. A negative result does not preclude SARS-CoV-2 infection  and should not be used as the sole basis for treatment or other  patient management decisions.  A negative result may occur with  improper specimen collection / handling, submission of specimen other  than nasopharyngeal swab, presence of viral mutation(s) within the  areas targeted by this assay, and inadequate number of viral copies  (<250 copies / mL). A negative result must be combined with clinical  observations, patient history, and epidemiological information. If result is POSITIVE SARS-CoV-2 target nucleic acids are DETECTED. The SARS-CoV-2 RNA is generally detectable in upper and lower  respiratory specimens dur ing the acute phase of infection.  Positive  results are indicative of active infection with SARS-CoV-2.  Clinical  correlation with patient history and other diagnostic information is  necessary to determine patient infection status.  Positive results do  not rule out bacterial infection or co-infection with other viruses. If result is PRESUMPTIVE POSTIVE SARS-CoV-2 nucleic acids MAY BE PRESENT.   A presumptive positive result was obtained on the submitted specimen  and confirmed on repeat testing.  While  2019 novel coronavirus  (SARS-CoV-2) nucleic acids may be present  in the submitted sample  additional confirmatory testing may be necessary for epidemiological  and / or clinical management purposes  to differentiate between  SARS-CoV-2 and other Sarbecovirus currently known to infect humans.  If clinically indicated additional testing with an alternate test  methodology [phone]([id number]) is advised. The SARS-CoV-2 RNA is generally  detectable in upper and lower respiratory sp ecimens during the acute  phase of infection. The expected result is Negative. Fact Sheet for Patients:  BoilerBrush.com.cyhttps://www.fda.gov/media/136312/download Fact Sheet for Healthcare Providers: https://pope.com/https://www.fda.gov/media/136313/download This test is not yet approved or cleared by the [place] FDA and has been authorized for detection and/or diagnosis of SARS-CoV-2 by FDA under an Emergency Use Authorization (EUA).  This EUA will remain in effect (meaning this test can be used) for the duration of the COVID-19 declaration under Section 564(b)(1) of the Act, 21 U.S.C. section 360bbb-3(b)(1), unless the authorization is terminated or revoked sooner. Performed at [facility], [address] [place], [place] [address]   MRSA PCR Screening     Status: None   Collection Time: 03/14/19  8:22 PM  Result Value Ref Range Status   MRSA by PCR NEGATIVE NEGATIVE Final    Comment:        The GeneXpert MRSA Assay (FDA approved for NASAL specimens only), is one component of a comprehensive MRSA colonization surveillance program. It is not intended to diagnose MRSA infection nor to guide or monitor treatment for MRSA infections. Performed at [facility], [address] [place], [place] [address]     Today   Subjective    Wayne Rowe today has no new complaints, shortness of breath and cough is improved, post ambulation no hypoxia or significant dyspnea on exertion          Patient has been seen and examined prior to  discharge   Objective   Blood pressure 104/70, pulse 70, temperature 97.8 F (36.6 C), temperature source Oral, resp. rate 16, height 6' (1.829 m), weight 69.1 kg, SpO2 98 %.   Intake/Output Summary (Last 24 hours) at 03/16/2019 1020 Last data filed at 03/16/2019 0449 Gross per 24 hour  Intake 720 ml  Output 550 ml  Net 170 ml    Exam Gen:- Awake Alert, no acute distress , speaking in sentences HEENT:- [place].AT, No sclera icterus Neck-Supple Neck,No JVD,.  Lungs-no wheezing, faint bilateral/symmetrical air movement CV- S1, S2 normal, regular Abd-  +ve B.Sounds, Abd Soft, No tenderness,    Extremity/Skin:- No  edema,   good pulses Psych-affect is appropriate, oriented x3 Neuro-no new focal deficits, no tremors    Data Review   CBC w Diff:  Lab Results  Component Value Date   WBC 17.1 (H) 03/16/2019   HGB 13.5 03/16/2019   HCT 43.2 03/16/2019   PLT 264 03/16/2019   LYMPHOPCT 29 11/28/2016   MONOPCT 10 11/28/2016   EOSPCT 2 11/28/2016   BASOPCT 1 11/28/2016    CMP:  Lab Results  Component Value Date   NA 138 03/16/2019   K 4.1 03/16/2019   CL 103 03/16/2019   CO2 24 03/16/2019   BUN 24 (H) 03/16/2019   CREATININE 1.07 03/16/2019   PROT 7.7 03/14/2019   ALBUMIN 4.5 03/14/2019   BILITOT 1.4 (H) 03/14/2019   ALKPHOS 90 03/14/2019   AST 42 (H) 03/14/2019   ALT 22 03/14/2019  .   Total Discharge time is about 33 minutes  Shon Haleourage Zaynab Chipman M.D on 03/16/2019 at 10:20 AM  Go to [email or web address] -  for contact info  [facility] - Office  [phone]

## 2019-03-16 NOTE — TOC Transition Note (Signed)
Transition of Care [facility]) - CM/SW Discharge Note   Patient Details  Name: [name] MRN: [id number] Date of Birth: 09-01-1961  Transition of Care [facility]) CM/SW Contact:  Malcolm Metro, RN Phone Number: 03/16/2019, 12:50 PM   Clinical Narrative:   DC home today. Med risk for readmission. Does not qualify for home O2. Needs neb machine. Machine delivered to room prior to DC.     Final next level of care: Home/Self Care     Patient Goals and CMS Choice Patient states their goals for this hospitalization and ongoing recovery are:: get what he needs CMS Medicare.gov Compare Post Acute Care list provided to:: Patient Choice offered to / list presented to : Patient                DME Arranged: Nebulizer/meds DME Agency: Patsy Lager Date DME Agency Contacted: 03/16/19 Time DME Agency Contacted: 0623 Representative spoke with at DME Agency: Ashly          Readmission Risk Interventions Readmission Risk Prevention Plan 03/16/2019  Post Dischage Appt Not Complete  Appt Comments multiple calls made to PCP office with no answer of busy signal  Medication Screening Complete  Transportation Screening Complete  Some recent data might be hidden

## 2019-03-18 DIAGNOSIS — J441 Chronic obstructive pulmonary disease with (acute) exacerbation: Secondary | ICD-10-CM | POA: Diagnosis not present

## 2019-03-18 DIAGNOSIS — J449 Chronic obstructive pulmonary disease, unspecified: Secondary | ICD-10-CM | POA: Diagnosis not present

## 2019-03-22 DIAGNOSIS — M1991 Primary osteoarthritis, unspecified site: Secondary | ICD-10-CM | POA: Diagnosis not present

## 2019-03-22 DIAGNOSIS — J441 Chronic obstructive pulmonary disease with (acute) exacerbation: Secondary | ICD-10-CM | POA: Diagnosis not present

## 2019-03-22 DIAGNOSIS — Z1389 Encounter for screening for other disorder: Secondary | ICD-10-CM | POA: Diagnosis not present

## 2019-03-22 DIAGNOSIS — Z6820 Body mass index (BMI) 20.0-20.9, adult: Secondary | ICD-10-CM | POA: Diagnosis not present

## 2019-03-22 DIAGNOSIS — G894 Chronic pain syndrome: Secondary | ICD-10-CM | POA: Diagnosis not present

## 2019-03-31 DIAGNOSIS — N509 Disorder of male genital organs, unspecified: Secondary | ICD-10-CM | POA: Diagnosis not present

## 2019-04-18 DIAGNOSIS — J441 Chronic obstructive pulmonary disease with (acute) exacerbation: Secondary | ICD-10-CM | POA: Diagnosis not present

## 2019-05-18 DIAGNOSIS — J441 Chronic obstructive pulmonary disease with (acute) exacerbation: Secondary | ICD-10-CM | POA: Diagnosis not present

## 2019-06-01 DIAGNOSIS — Z681 Body mass index (BMI) 19 or less, adult: Secondary | ICD-10-CM | POA: Diagnosis not present

## 2019-06-01 DIAGNOSIS — G894 Chronic pain syndrome: Secondary | ICD-10-CM | POA: Diagnosis not present

## 2019-06-01 DIAGNOSIS — L309 Dermatitis, unspecified: Secondary | ICD-10-CM | POA: Diagnosis not present

## 2019-06-01 DIAGNOSIS — M159 Polyosteoarthritis, unspecified: Secondary | ICD-10-CM | POA: Diagnosis not present

## 2019-06-10 DIAGNOSIS — N509 Disorder of male genital organs, unspecified: Secondary | ICD-10-CM | POA: Diagnosis not present

## 2019-06-18 DIAGNOSIS — J441 Chronic obstructive pulmonary disease with (acute) exacerbation: Secondary | ICD-10-CM | POA: Diagnosis not present

## 2019-06-30 DIAGNOSIS — G894 Chronic pain syndrome: Secondary | ICD-10-CM | POA: Diagnosis not present

## 2019-06-30 DIAGNOSIS — Z681 Body mass index (BMI) 19 or less, adult: Secondary | ICD-10-CM | POA: Diagnosis not present

## 2019-06-30 DIAGNOSIS — R42 Dizziness and giddiness: Secondary | ICD-10-CM | POA: Diagnosis not present

## 2019-06-30 DIAGNOSIS — J449 Chronic obstructive pulmonary disease, unspecified: Secondary | ICD-10-CM | POA: Diagnosis not present

## 2019-07-08 DIAGNOSIS — N509 Disorder of male genital organs, unspecified: Secondary | ICD-10-CM | POA: Diagnosis not present

## 2019-07-19 DIAGNOSIS — J441 Chronic obstructive pulmonary disease with (acute) exacerbation: Secondary | ICD-10-CM | POA: Diagnosis not present

## 2019-07-27 DIAGNOSIS — Z23 Encounter for immunization: Secondary | ICD-10-CM | POA: Diagnosis not present

## 2019-07-27 DIAGNOSIS — G894 Chronic pain syndrome: Secondary | ICD-10-CM | POA: Diagnosis not present

## 2019-07-27 DIAGNOSIS — Z681 Body mass index (BMI) 19 or less, adult: Secondary | ICD-10-CM | POA: Diagnosis not present

## 2019-08-18 DIAGNOSIS — J441 Chronic obstructive pulmonary disease with (acute) exacerbation: Secondary | ICD-10-CM | POA: Diagnosis not present

## 2019-08-19 DIAGNOSIS — N509 Disorder of male genital organs, unspecified: Secondary | ICD-10-CM | POA: Diagnosis not present

## 2019-08-23 DIAGNOSIS — L309 Dermatitis, unspecified: Secondary | ICD-10-CM | POA: Diagnosis not present

## 2019-08-23 DIAGNOSIS — G894 Chronic pain syndrome: Secondary | ICD-10-CM | POA: Diagnosis not present

## 2019-08-23 DIAGNOSIS — Z6820 Body mass index (BMI) 20.0-20.9, adult: Secondary | ICD-10-CM | POA: Diagnosis not present

## 2019-08-23 DIAGNOSIS — M2550 Pain in unspecified joint: Secondary | ICD-10-CM | POA: Diagnosis not present

## 2019-09-14 DIAGNOSIS — Z6820 Body mass index (BMI) 20.0-20.9, adult: Secondary | ICD-10-CM | POA: Diagnosis not present

## 2019-09-14 DIAGNOSIS — G894 Chronic pain syndrome: Secondary | ICD-10-CM | POA: Diagnosis not present

## 2019-09-18 DIAGNOSIS — J441 Chronic obstructive pulmonary disease with (acute) exacerbation: Secondary | ICD-10-CM | POA: Diagnosis not present

## 2019-09-22 ENCOUNTER — Other Ambulatory Visit: Payer: Self-pay

## 2019-09-22 ENCOUNTER — Emergency Department (HOSPITAL_COMMUNITY)
Admission: EM | Admit: 2019-09-22 | Discharge: 2019-09-22 | Disposition: A | Discharge status: Home / Self Care | Payer: Medicare HMO | Attending: Emergency Medicine | Admitting: Emergency Medicine

## 2019-09-22 ENCOUNTER — Emergency Department (HOSPITAL_COMMUNITY): Payer: Medicare HMO

## 2019-09-22 ENCOUNTER — Encounter (HOSPITAL_COMMUNITY): Payer: Self-pay | Admitting: *Deleted

## 2019-09-22 DIAGNOSIS — E876 Hypokalemia: Secondary | ICD-10-CM | POA: Insufficient documentation

## 2019-09-22 DIAGNOSIS — R109 Unspecified abdominal pain: Secondary | ICD-10-CM | POA: Diagnosis not present

## 2019-09-22 DIAGNOSIS — F17210 Nicotine dependence, cigarettes, uncomplicated: Secondary | ICD-10-CM | POA: Diagnosis not present

## 2019-09-22 DIAGNOSIS — R1110 Vomiting, unspecified: Secondary | ICD-10-CM | POA: Diagnosis not present

## 2019-09-22 DIAGNOSIS — Z20828 Contact with and (suspected) exposure to other viral communicable diseases: Secondary | ICD-10-CM | POA: Diagnosis not present

## 2019-09-22 DIAGNOSIS — Z79899 Other long term (current) drug therapy: Secondary | ICD-10-CM | POA: Insufficient documentation

## 2019-09-22 DIAGNOSIS — R112 Nausea with vomiting, unspecified: Secondary | ICD-10-CM | POA: Insufficient documentation

## 2019-09-22 DIAGNOSIS — J441 Chronic obstructive pulmonary disease with (acute) exacerbation: Secondary | ICD-10-CM | POA: Diagnosis not present

## 2019-09-22 DIAGNOSIS — R0602 Shortness of breath: Secondary | ICD-10-CM | POA: Diagnosis not present

## 2019-09-22 LAB — TROPONIN I (HIGH SENSITIVITY)
Troponin I (High Sensitivity): 4 ng/L (ref <18)
Troponin I (High Sensitivity): 5 ng/L (ref <18)

## 2019-09-22 LAB — URINALYSIS, ROUTINE W REFLEX MICROSCOPIC
Bilirubin Urine: NEGATIVE
Glucose, UA: NEGATIVE mg/dL
Hgb urine dipstick: NEGATIVE
Ketones, ur: 80 mg/dL — AB
Leukocytes,Ua: NEGATIVE
Nitrite: NEGATIVE
Protein, ur: NEGATIVE mg/dL
Specific Gravity, Urine: 1.038 — ABNORMAL HIGH (ref 1.005–1.030)
pH: 8 (ref 5.0–8.0)

## 2019-09-22 LAB — CBC WITH DIFFERENTIAL/PLATELET
Abs Immature Granulocytes: 0.02 10*3/uL (ref 0.00–0.07)
Basophils Absolute: 0 10*3/uL (ref 0.0–0.1)
Basophils Relative: 0 %
Eosinophils Absolute: 0 10*3/uL (ref 0.0–0.5)
Eosinophils Relative: 0 %
HCT: 46.2 % (ref 39.0–52.0)
Hemoglobin: 15 g/dL (ref 13.0–17.0)
Immature Granulocytes: 0 %
Lymphocytes Relative: 9 %
Lymphs Abs: 0.9 10*3/uL (ref 0.7–4.0)
MCH: 28.9 pg (ref 26.0–34.0)
MCHC: 32.5 g/dL (ref 30.0–36.0)
MCV: 89 fL (ref 80.0–100.0)
Monocytes Absolute: 0.6 10*3/uL (ref 0.1–1.0)
Monocytes Relative: 7 %
Neutro Abs: 7.5 10*3/uL (ref 1.7–7.7)
Neutrophils Relative %: 84 %
Platelets: 345 10*3/uL (ref 150–400)
RBC: 5.19 MIL/uL (ref 4.22–5.81)
RDW: 12.9 % (ref 11.5–15.5)
WBC: 9 10*3/uL (ref 4.0–10.5)
nRBC: 0 % (ref 0.0–0.2)

## 2019-09-22 LAB — COMPREHENSIVE METABOLIC PANEL
ALT: 14 U/L (ref 0–44)
AST: 18 U/L (ref 15–41)
Albumin: 4.2 g/dL (ref 3.5–5.0)
Alkaline Phosphatase: 88 U/L (ref 38–126)
Anion gap: 13 (ref 5–15)
BUN: 10 mg/dL (ref 6–20)
CO2: 21 mmol/L — ABNORMAL LOW (ref 22–32)
Calcium: 9.1 mg/dL (ref 8.9–10.3)
Chloride: 105 mmol/L (ref 98–111)
Creatinine, Ser: 0.99 mg/dL (ref 0.61–1.24)
GFR calc Af Amer: >60 mL/min (ref >60)
GFR calc non Af Amer: >60 mL/min (ref >60)
Glucose, Bld: 162 mg/dL — ABNORMAL HIGH (ref 70–99)
Potassium: 3.1 mmol/L — ABNORMAL LOW (ref 3.5–5.1)
Sodium: 139 mmol/L (ref 135–145)
Total Bilirubin: 1 mg/dL (ref 0.3–1.2)
Total Protein: 7.4 g/dL (ref 6.5–8.1)

## 2019-09-22 LAB — POC SARS CORONAVIRUS 2 AG -  ED: SARS Coronavirus 2 Ag: NEGATIVE

## 2019-09-22 LAB — LIPASE, BLOOD: Lipase: 19 U/L (ref 11–51)

## 2019-09-22 MED ORDER — MORPHINE SULFATE (PF) 4 MG/ML IV SOLN
4.0000 mg | Freq: Once | INTRAVENOUS | Status: AC
  Administered 2019-09-22: 4 mg via INTRAVENOUS
  Filled 2019-09-22: qty 1

## 2019-09-22 MED ORDER — PREDNISONE 20 MG PO TABS
40.0000 mg | ORAL_TABLET | Freq: Once | ORAL | Status: AC
  Administered 2019-09-22: 40 mg via ORAL
  Filled 2019-09-22: qty 2

## 2019-09-22 MED ORDER — POTASSIUM CHLORIDE CRYS ER 20 MEQ PO TBCR: 40.0000 meq | EXTENDED_RELEASE_TABLET | Freq: Every day | ORAL | 0 refills | Status: DC

## 2019-09-22 MED ORDER — ONDANSETRON HCL 4 MG/2ML IJ SOLN
4.0000 mg | Freq: Once | INTRAMUSCULAR | Status: AC
  Administered 2019-09-22: 4 mg via INTRAVENOUS
  Filled 2019-09-22: qty 2

## 2019-09-22 MED ORDER — PREDNISONE 20 MG PO TABS: 40.0000 mg | ORAL_TABLET | Freq: Every day | ORAL | 0 refills | Status: DC

## 2019-09-22 MED ORDER — ONDANSETRON 4 MG PO TBDP: 4.0000 mg | ORAL_TABLET | Freq: Three times a day (TID) | ORAL | 0 refills | Status: DC | PRN

## 2019-09-22 MED ORDER — IOHEXOL 300 MG/ML  SOLN
100.0000 mL | Freq: Once | INTRAMUSCULAR | Status: AC | PRN
  Administered 2019-09-22: 100 mL via INTRAVENOUS

## 2019-09-22 MED ORDER — SODIUM CHLORIDE 0.9 % IV BOLUS
1000.0000 mL | Freq: Once | INTRAVENOUS | Status: AC
  Administered 2019-09-22: 1000 mL via INTRAVENOUS

## 2019-09-22 NOTE — ED Provider Notes (Signed)
[facility]Y [facility]PART[facility] Provider Note   CSN: [id number] Arrival date & time: 09/22/19  1931   History   Chief Complaint Chief Complaint  Patient presents with  . Shortness of Breath  . Emesis   HPI Wayne Rowe is a 58 y.o. male with history of COPD, smoking, chronic back pain who presents with vomiting. He states that his symptoms started yesterday and he feels sick. He has had 20-30 episodes of vomiting which is just "foam". No bilious or bloody emesis. He reports associated abdominal pain in the periumbilical area. It's constant and worse with vomiting. He has had SOB for several days with wheezing. He is using home inhalers without significant relief. No fever, chills, sweats, headache, anosmia, chest pain, cough, diarrhea. He reports decreased urination. No prior abdominal surgeries. He reports having a sick contact but they just didn't feel well and tested negative for COVID.    HPI  Past Medical History:  Diagnosis Date  . Anxiety   . Arthritis   . Back pain   . DDD (degenerative disc disease)     Patient Active Problem List   Diagnosis Date Noted  . COPD with acute exacerbation (HCC) 03/14/2019  . Acute respiratory distress 03/14/2019  . Respiratory alkalosis 03/14/2019  . Hypokalemia 03/14/2019  . Pulmonary nodule 03/14/2019    Past Surgical History:  Procedure Laterality Date  . BACK SURGERY         Home Medications    Prior to Admission medications   Medication Sig Start Date End Date Taking? Authorizing Provider  acetaminophen (TYLENOL) 325 MG tablet Take 2 tablets (650 mg total) by mouth every 6 (six) hours as needed for mild pain (or Fever >/= 101). 03/16/19   Shon HaleEmokpae, Courage, MD  albuterol (PROVENTIL) (2.5 MG/3ML) 0.083% nebulizer solution Take 3 mLs (2.5 mg total) by nebulization every 6 (six) hours as needed for wheezing or shortness of breath. Dx---J 44.1 03/16/19   Emokpae, Courage, MD  carisoprodol (SOMA) 350 MG tablet Take 350 mg by  mouth 4 (four) times daily as needed for muscle spasms.    [provider]  celecoxib (CELEBREX) 200 MG capsule Take 1 capsule (200 mg total) by mouth daily with breakfast. 03/16/19   Shon HaleEmokpae, Courage, MD  clotrimazole-betamethasone (LOTRISONE) cream Apply 1 application topically 2 (two) times daily.  02/24/19   [provider]  Oxycodone HCl 10 MG TABS Take 10 mg by mouth 2 (two) times a day.  12/15/18   [provider]  pantoprazole (PROTONIX) 40 MG tablet Take 1 tablet (40 mg total) by mouth daily. 12/26/18   Gilda CreasePollina, Christopher J, MD  predniSONE (DELTASONE) 20 MG tablet Take 2 tablets (40 mg total) by mouth daily with breakfast. 03/16/19   Mariea ClontsEmokpae, Courage, MD  pregabalin (LYRICA) 200 MG capsule Take 200 mg by mouth 2 (two) times daily.     [provider]  senna-docusate (SENOKOT-S) 8.6-50 MG tablet Take 2 tablets by mouth at bedtime. 03/16/19   Shon HaleEmokpae, Courage, MD  tamsulosin (FLOMAX) 0.4 MG CAPS capsule Take 0.8 mg by mouth every evening.  04/12/15   [provider]  tiotropium (SPIRIVA HANDIHALER) 18 MCG inhalation capsule Place 1 capsule (18 mcg total) into inhaler and inhale daily. 03/16/19 03/15/20  Shon HaleEmokpae, Courage, MD  VENTOLIN HFA 108 (90 Base) MCG/ACT inhaler Inhale 1-2 puffs into the lungs every 6 (six) hours as needed for wheezing or shortness of breath.  03/08/19   [provider]    Family History History  reviewed. No pertinent family history.  Social History Social History   Tobacco Use  . Smoking status: Current Every Day Smoker    Packs/day: 1.00    Types: Cigarettes  . Smokeless tobacco: Never Used  Substance Use Topics  . Alcohol use: Not Currently  . Drug use: Yes    Types: Marijuana    Comment: denies use 09/22/19     Allergies   Patient has no known allergies.   Review of Systems Review of Systems  Constitutional: Negative for chills and fever.  Respiratory: Positive for shortness of breath and wheezing.  Negative for cough.   Cardiovascular: Negative for chest pain.  Gastrointestinal: Positive for abdominal pain, nausea and vomiting. Negative for diarrhea.  Genitourinary: Positive for decreased urine volume. Negative for difficulty urinating and dysuria.  Musculoskeletal: Negative for back pain and neck pain.  Neurological: Negative for headaches.  All other systems reviewed and are negative.    Physical Exam Updated Vital Signs BP (!) 140/95 (BP Location: Right Arm)   Pulse 94   Temp 98 F (36.7 C) (Oral)   Resp 18   Ht 6' (1.829 m)   Wt 65.8 kg   SpO2 98%   BMI 19.67 kg/m   Physical Exam Vitals signs and nursing note reviewed.  Constitutional:      General: He is not in acute distress.    Appearance: He is well-developed. He is not ill-appearing.     Comments: Chronically ill appearing male in mild distress. Cooperative  HENT:     Head: Normocephalic and atraumatic.     Mouth/Throat:     Mouth: Mucous membranes are dry.  Eyes:     General: No scleral icterus.       Right eye: No discharge.        Left eye: No discharge.     Conjunctiva/sclera: Conjunctivae normal.     Pupils: Pupils are equal, round, and reactive to light.  Neck:     Musculoskeletal: Normal range of motion.  Cardiovascular:     Rate and Rhythm: Normal rate and regular rhythm.  Pulmonary:     Effort: Pulmonary effort is normal. No respiratory distress.     Breath sounds: Normal breath sounds.  Abdominal:     General: There is no distension.     Palpations: Abdomen is soft.     Tenderness: There is abdominal tenderness (periumbilical and epigastric tenderness).  Skin:    General: Skin is warm and dry.  Neurological:     Mental Status: He is alert and oriented to person, place, and time.  Psychiatric:        Behavior: Behavior normal.      ED Treatments / Results  Labs (all labs ordered are listed, but only abnormal results are displayed) Labs Reviewed  COMPREHENSIVE METABOLIC PANEL -  Abnormal; Notable for the following components:      Result Value   Potassium 3.1 (*)    CO2 21 (*)    Glucose, Bld 162 (*)    All other components within normal limits  NOVEL CORONAVIRUS, NAA (HOSP ORDER, SEND-OUT TO REF LAB; TAT 18-24 HRS)  CBC WITH DIFFERENTIAL/PLATELET  LIPASE, BLOOD  URINALYSIS, ROUTINE W REFLEX MICROSCOPIC  POC SARS CORONAVIRUS 2 AG -  ED  TROPONIN I (HIGH SENSITIVITY)  TROPONIN I (HIGH SENSITIVITY)    EKG EKG Interpretation  Date/Time:  Thursday September 22 2019 20:01:56 EST Ventricular Rate:  77 PR Interval:    QRS Duration: 98 QT Interval:  415  QTC Calculation: 470 R Axis:   46 Text Interpretation: Sinus rhythm Biatrial enlargement No significant change since last tracing Confirmed by Isla Pence [phone]) on 09/22/2019 8:13:18 PM   Radiology Ct Abdomen Pelvis W Contrast  Result Date: 09/22/2019 CLINICAL DATA:  Nausea vomiting with 2 days of upper abdominal pain EXAM: CT ABDOMEN AND PELVIS WITH CONTRAST TECHNIQUE: Multidetector CT imaging of the abdomen and pelvis was performed using the standard protocol following bolus administration of intravenous contrast. CONTRAST:  122mL OMNIPAQUE IOHEXOL 300 MG/ML  SOLN COMPARISON:  None. FINDINGS: Lower chest: The visualized heart size within normal limits. No pericardial fluid/thickening. No hiatal hernia. The visualized portions of the lungs are clear. Hepatobiliary: The liver is normal in density without focal abnormality.The main portal vein is patent. No evidence of calcified gallstones, gallbladder wall thickening or biliary dilatation. Pancreas: Unremarkable. No pancreatic ductal dilatation or surrounding inflammatory changes. Spleen: Normal in size without focal abnormality. Adrenals/Urinary Tract: Both adrenal glands appear normal. The kidneys and collecting system appear normal without evidence of urinary tract calculus or hydronephrosis. Bladder is unremarkable. Stomach/Bowel: The stomach, small bowel, and  colon are normal in appearance. No inflammatory changes, wall thickening, or obstructive findings. There is a moderate amount of colonic stool present. The appendix is not well visualized in the right lower quadrant, however no inflammatory changes are seen in the right lower quadrant. Vascular/Lymphatic: There are no enlarged mesenteric, retroperitoneal, or pelvic lymph nodes. Scattered aortic atherosclerotic calcifications are seen without aneurysmal dilatation. Reproductive: The prostate is unremarkable. Other: No evidence of abdominal wall mass or hernia. Musculoskeletal: No acute or significant osseous findings. There is a dextroconvex scoliotic curvature of the lumbar spine. Degenerative changes are seen throughout the lumbar spine for. There is been a prior decompression L3 through L5. IMPRESSION: No acute intra-abdominal or pelvic pathology to explain the patient's symptoms. Moderate amount colonic stool without evidence of obstruction. Aortic Atherosclerosis (ICD10-I70.0). Electronically Signed   By: Prudencio Pair M.D.   On: 09/22/2019 21:59   Dg Chest Port 1 View  Result Date: 09/22/2019 CLINICAL DATA:  58 year old male with shortness of breath. EXAM: PORTABLE CHEST 1 VIEW COMPARISON:  Chest radiograph dated 03/14/2019. FINDINGS: Mild diffuse chronic bronchitic changes. No focal consolidation, pleural effusion, or pneumothorax. The cardiac silhouette is within normal limits. No acute osseous pathology. IMPRESSION: No active cardiopulmonary disease. Electronically Signed   By: Anner Crete M.D.   On: 09/22/2019 20:34    Procedures Procedures (including critical care time)  Medications Ordered in ED Medications  sodium chloride 0.9 % bolus 1,000 mL (0 mLs Intravenous Stopped 09/22/19 2202)  ondansetron (ZOFRAN) injection 4 mg (4 mg Intravenous Given 09/22/19 2014)  morphine 4 MG/ML injection 4 mg (4 mg Intravenous Given 09/22/19 2014)  iohexol (OMNIPAQUE) 300 MG/ML solution 100 mL (100 mLs  Intravenous Contrast Given 09/22/19 2134)  morphine 4 MG/ML injection 4 mg (4 mg Intravenous Given 09/22/19 2202)  ondansetron (ZOFRAN) injection 4 mg (4 mg Intravenous Given 09/22/19 2211)  predniSONE (DELTASONE) tablet 40 mg (40 mg Oral Given 09/22/19 2211)     Initial Impression / Assessment and Plan / ED Course  I have reviewed the triage vital signs and the nursing notes.  Pertinent labs & imaging results that were available during my care of the patient were reviewed by me and considered in my medical decision making (see chart for details).  58 year old male presents with intractable N/V with abdominal pain as well as SOB and wheezing. Pt has known COPD  and is a smoker. BP is elevated otherwise vitals are normal. Heart is regular rate and rhythm. Lungs are CTA. Abdomen is soft and tender in the periumbilical and epigastric region. Will obtain EKG, CXR, labs, UA, rapid COVID. He is focally tender therefore will obtain CT A/P. Will give fluids, Zofran, morphine  CBC is normal. CMP is remarkable for mild hypokalemia (3.1), low bicarb (21), and hyperglycemia (162). Trop is normal. CXR is negative. EKG is SR with biatrial enlargement.  CT is negative. Rechecked pt. He is complaining of ongoing nausea but is drinking a soda. Will give another dose of Zofran. Will tx for mild COPD exacerbation with prednisone and acute GI illness with Zofran and potassium. Advised return if worsening. Rapid COVID was negative. Will do sent out test.  Wayne Rowe was evaluated in Emergency Department on 09/22/2019 for the symptoms described in the history of present illness. He was evaluated in the context of the global COVID-19 pandemic, which necessitated consideration that the patient might be at risk for infection with the SARS-CoV-2 virus that causes COVID-19. Institutional protocols and algorithms that pertain to the evaluation of patients at risk for COVID-19 are in a state of rapid change based on information  released by regulatory bodies including the CDC and federal and state organizations. These policies and algorithms were followed during the patient's care in the ED.   Final Clinical Impressions(s) / ED Diagnoses   Final diagnoses:  COPD exacerbation (HCC)  Non-intractable vomiting with nausea, unspecified vomiting type  Hypokalemia    ED Discharge Orders    None       Beryle Quant 09/22/19 2219    Jacalyn Lefevre, MD 09/22/19 2241

## 2019-09-22 NOTE — Discharge Instructions (Addendum)
Please quarantine until you get the results of your COVID test which can take up to 2 days Take Prednisone for the next 5 days and continue inhalers for you shortness of breath Take Potassium for the next 5 days Take Zofran as needed for nausea and vomiting Please return if worse

## 2019-09-22 NOTE — ED Triage Notes (Signed)
Pt with emesis and sob since yesterday.  Emesis x 20-30 per pt. Denies diarrhea.

## 2019-09-24 LAB — NOVEL CORONAVIRUS, NAA (HOSP ORDER, SEND-OUT TO REF LAB; TAT 18-24 HRS): SARS-CoV-2, NAA: NOT DETECTED

## 2019-09-28 ENCOUNTER — Encounter (HOSPITAL_COMMUNITY): Payer: Self-pay | Admitting: Emergency Medicine

## 2019-09-28 ENCOUNTER — Emergency Department (HOSPITAL_COMMUNITY): Payer: Medicare HMO

## 2019-09-28 ENCOUNTER — Emergency Department (HOSPITAL_COMMUNITY)
Admission: EM | Admit: 2019-09-28 | Discharge: 2019-09-28 | Disposition: A | Discharge status: Home / Self Care | Payer: Medicare HMO | Attending: Emergency Medicine | Admitting: Emergency Medicine

## 2019-09-28 ENCOUNTER — Other Ambulatory Visit: Payer: Self-pay

## 2019-09-28 DIAGNOSIS — N179 Acute kidney failure, unspecified: Secondary | ICD-10-CM | POA: Diagnosis not present

## 2019-09-28 DIAGNOSIS — R1110 Vomiting, unspecified: Secondary | ICD-10-CM | POA: Insufficient documentation

## 2019-09-28 DIAGNOSIS — Z20828 Contact with and (suspected) exposure to other viral communicable diseases: Secondary | ICD-10-CM | POA: Insufficient documentation

## 2019-09-28 DIAGNOSIS — R0600 Dyspnea, unspecified: Secondary | ICD-10-CM | POA: Diagnosis not present

## 2019-09-28 DIAGNOSIS — J449 Chronic obstructive pulmonary disease, unspecified: Secondary | ICD-10-CM | POA: Diagnosis not present

## 2019-09-28 DIAGNOSIS — F17210 Nicotine dependence, cigarettes, uncomplicated: Secondary | ICD-10-CM | POA: Diagnosis not present

## 2019-09-28 DIAGNOSIS — Z79899 Other long term (current) drug therapy: Secondary | ICD-10-CM | POA: Diagnosis not present

## 2019-09-28 DIAGNOSIS — R112 Nausea with vomiting, unspecified: Secondary | ICD-10-CM | POA: Diagnosis not present

## 2019-09-28 DIAGNOSIS — R0602 Shortness of breath: Secondary | ICD-10-CM | POA: Diagnosis not present

## 2019-09-28 LAB — COMPREHENSIVE METABOLIC PANEL
ALT: 17 U/L (ref 0–44)
AST: 13 U/L — ABNORMAL LOW (ref 15–41)
Albumin: 4.3 g/dL (ref 3.5–5.0)
Alkaline Phosphatase: 79 U/L (ref 38–126)
Anion gap: 14 (ref 5–15)
BUN: 24 mg/dL — ABNORMAL HIGH (ref 6–20)
CO2: 23 mmol/L (ref 22–32)
Calcium: 9.4 mg/dL (ref 8.9–10.3)
Chloride: 97 mmol/L — ABNORMAL LOW (ref 98–111)
Creatinine, Ser: 1.28 mg/dL — ABNORMAL HIGH (ref 0.61–1.24)
GFR calc Af Amer: >60 mL/min (ref >60)
GFR calc non Af Amer: >60 mL/min (ref >60)
Glucose, Bld: 97 mg/dL (ref 70–99)
Potassium: 3.6 mmol/L (ref 3.5–5.1)
Sodium: 134 mmol/L — ABNORMAL LOW (ref 135–145)
Total Bilirubin: 1.1 mg/dL (ref 0.3–1.2)
Total Protein: 7.9 g/dL (ref 6.5–8.1)

## 2019-09-28 LAB — CBC WITH DIFFERENTIAL/PLATELET
Abs Immature Granulocytes: 0.04 10*3/uL (ref 0.00–0.07)
Basophils Absolute: 0 10*3/uL (ref 0.0–0.1)
Basophils Relative: 0 %
Eosinophils Absolute: 0.1 10*3/uL (ref 0.0–0.5)
Eosinophils Relative: 1 %
HCT: 52.7 % — ABNORMAL HIGH (ref 39.0–52.0)
Hemoglobin: 17.8 g/dL — ABNORMAL HIGH (ref 13.0–17.0)
Immature Granulocytes: 0 %
Lymphocytes Relative: 24 %
Lymphs Abs: 2.2 10*3/uL (ref 0.7–4.0)
MCH: 29.3 pg (ref 26.0–34.0)
MCHC: 33.8 g/dL (ref 30.0–36.0)
MCV: 86.8 fL (ref 80.0–100.0)
Monocytes Absolute: 1 10*3/uL (ref 0.1–1.0)
Monocytes Relative: 10 %
Neutro Abs: 6.1 10*3/uL (ref 1.7–7.7)
Neutrophils Relative %: 65 %
Platelets: 396 10*3/uL (ref 150–400)
RBC: 6.07 MIL/uL — ABNORMAL HIGH (ref 4.22–5.81)
RDW: 12.5 % (ref 11.5–15.5)
WBC: 9.5 10*3/uL (ref 4.0–10.5)
nRBC: 0 % (ref 0.0–0.2)

## 2019-09-28 LAB — TROPONIN I (HIGH SENSITIVITY)
Troponin I (High Sensitivity): 5 ng/L (ref <18)
Troponin I (High Sensitivity): 3 ng/L (ref <18)

## 2019-09-28 LAB — BRAIN NATRIURETIC PEPTIDE: B Natriuretic Peptide: 35 pg/mL (ref 0.0–100.0)

## 2019-09-28 LAB — SARS CORONAVIRUS 2 (TAT 6-24 HRS): SARS Coronavirus 2: NEGATIVE

## 2019-09-28 MED ORDER — HYDROCODONE-ACETAMINOPHEN 5-325 MG PO TABS
2.0000 | ORAL_TABLET | Freq: Once | ORAL | Status: AC
  Administered 2019-09-28: 15:00:00 2 via ORAL
  Filled 2019-09-28: qty 2

## 2019-09-28 MED ORDER — SODIUM CHLORIDE 0.9 % IV BOLUS
500.0000 mL | Freq: Once | INTRAVENOUS | Status: AC
  Administered 2019-09-28: 500 mL via INTRAVENOUS

## 2019-09-28 MED ORDER — SODIUM CHLORIDE 0.9 % IV BOLUS
500.0000 mL | Freq: Once | INTRAVENOUS | Status: AC
  Administered 2019-09-28: 13:00:00 500 mL via INTRAVENOUS

## 2019-09-28 MED ORDER — ONDANSETRON HCL 4 MG/2ML IJ SOLN
4.0000 mg | Freq: Once | INTRAMUSCULAR | Status: AC
  Administered 2019-09-28: 4 mg via INTRAVENOUS
  Filled 2019-09-28: qty 2

## 2019-09-28 MED ORDER — ONDANSETRON 4 MG PO TBDP: ORAL_TABLET | ORAL | 0 refills | Status: DC

## 2019-09-28 NOTE — ED Provider Notes (Signed)
[facility] EMERGENCY DEPARTMENT Provider Note   CSN: [id number] Arrival date & time: 09/28/19  1042     History   Chief Complaint Chief Complaint  Patient presents with  . Shortness of Breath    HPI Wayne Rowe is a 58 y.o. male.     Patient with history of COPD not on home oxygen presents with persistent shortness of breath, nausea and vomiting for the past week.  Decreased oral intake.  Decreased energy overall.  Patient had a negative Covid test on Friday.  Patient denies significant sick contacts.  No documented fevers.  Patient's had intermittent anterior nonradiating chest discomfort with coughing and vomiting.  Patient denies cardiac history.  No specific exertional symptoms.     Past Medical History:  Diagnosis Date  . Anxiety   . Arthritis   . Back pain   . DDD (degenerative disc disease)     Patient Active Problem List   Diagnosis Date Noted  . COPD with acute exacerbation ([place]) 03/14/2019  . Acute respiratory distress 03/14/2019  . Respiratory alkalosis 03/14/2019  . Hypokalemia 03/14/2019  . Pulmonary nodule 03/14/2019    Past Surgical History:  Procedure Laterality Date  . BACK SURGERY          Home Medications    Prior to Admission medications   Medication Sig Start Date End Date Taking? Authorizing Provider  acetaminophen (TYLENOL) 325 MG tablet Take 2 tablets (650 mg total) by mouth every 6 (six) hours as needed for mild pain (or Fever >/= 101). 03/16/19  Yes Emokpae, Courage, MD  albuterol (PROVENTIL) (2.5 MG/3ML) 0.083% nebulizer solution Take 3 mLs (2.5 mg total) by nebulization every 6 (six) hours as needed for wheezing or shortness of breath. Dx---J 44.1 03/16/19  Yes Emokpae, Courage, MD  carisoprodol (SOMA) 350 MG tablet Take 350 mg by mouth 4 (four) times daily as needed for muscle spasms.   Yes [provider]  celecoxib (CELEBREX) 200 MG capsule Take 1 capsule (200 mg total) by mouth daily with breakfast. 03/16/19  Yes  Emokpae, Courage, MD  ondansetron (ZOFRAN ODT) 4 MG disintegrating tablet Take 1 tablet (4 mg total) by mouth every 8 (eight) hours as needed for nausea or vomiting. 09/22/19  Yes Recardo Evangelist, PA-C  Oxycodone HCl 10 MG TABS Take 10 mg by mouth 2 (two) times a day.  12/15/18  Yes [provider]  pantoprazole (PROTONIX) 40 MG tablet Take 1 tablet (40 mg total) by mouth daily. 12/26/18  Yes Pollina, Gwenyth Allegra, MD  potassium chloride SA (KLOR-CON) 20 MEQ tablet Take 2 tablets (40 mEq total) by mouth daily. 09/22/19  Yes Recardo Evangelist, PA-C  predniSONE (DELTASONE) 20 MG tablet Take 2 tablets (40 mg total) by mouth daily. 09/22/19  Yes Recardo Evangelist, PA-C  pregabalin (LYRICA) 200 MG capsule Take 200 mg by mouth 2 (two) times daily.    Yes [provider]  senna-docusate (SENOKOT-S) 8.6-50 MG tablet Take 2 tablets by mouth at bedtime. 03/16/19  Yes Roxan Hockey, MD  tamsulosin (FLOMAX) 0.4 MG CAPS capsule Take 0.8 mg by mouth every evening.  04/12/15  Yes [provider]  clotrimazole-betamethasone (LOTRISONE) cream Apply 1 application topically 2 (two) times daily.  02/24/19   [provider]  ondansetron (ZOFRAN ODT) 4 MG disintegrating tablet 4mg  ODT q4 hours prn nausea/vomit 09/28/19   Elnora Morrison, MD  tiotropium (SPIRIVA HANDIHALER) 18 MCG inhalation capsule Place 1 capsule (18 mcg total) into inhaler and inhale daily.  Patient not taking: Reported on 09/28/2019 03/16/19 03/15/20  Shon Hale, MD  TRELEGY ELLIPTA 100-62.5-25 MCG/INH AEPB Inhale 1 puff into the lungs daily. 09/16/19   [provider]  VENTOLIN HFA 108 (90 Base) MCG/ACT inhaler Inhale 1-2 puffs into the lungs every 6 (six) hours as needed for wheezing or shortness of breath.  03/08/19   [provider]    Family History History reviewed. No pertinent family history.  Social History Social History   Tobacco Use  . Smoking status: Current Every Day Smoker     Packs/day: 1.00    Types: Cigarettes  . Smokeless tobacco: Never Used  Substance Use Topics  . Alcohol use: Not Currently  . Drug use: Yes    Types: Marijuana    Comment: denies use 09/22/19     Allergies   Patient has no known allergies.   Review of Systems Review of Systems  Constitutional: Negative for chills and fever.  HENT: Positive for congestion.   Eyes: Negative for visual disturbance.  Respiratory: Positive for cough and shortness of breath.   Cardiovascular: Positive for chest pain. Negative for leg swelling.  Gastrointestinal: Positive for vomiting. Negative for abdominal pain.  Genitourinary: Negative for dysuria and flank pain.  Musculoskeletal: Negative for back pain, neck pain and neck stiffness.  Skin: Negative for rash.  Neurological: Negative for light-headedness and headaches.     Physical Exam Updated Vital Signs BP 114/85   Pulse (!) 105   Temp 97.8 F (36.6 C) (Oral)   Resp 20   Ht 6' (1.829 m)   Wt 65.8 kg   SpO2 100%   BMI 19.67 kg/m   Physical Exam Vitals signs and nursing note reviewed.  Constitutional:      Appearance: He is well-developed.  HENT:     Head: Normocephalic and atraumatic.  Eyes:     General:        Right eye: No discharge.        Left eye: No discharge.     Conjunctiva/sclera: Conjunctivae normal.  Neck:     Musculoskeletal: Normal range of motion and neck supple.     Trachea: No tracheal deviation.  Cardiovascular:     Rate and Rhythm: Normal rate and regular rhythm.  Pulmonary:     Effort: Pulmonary effort is normal.     Breath sounds: No rhonchi.  Abdominal:     General: There is no distension.     Palpations: Abdomen is soft.     Tenderness: There is no abdominal tenderness. There is no guarding.  Musculoskeletal: Normal range of motion.     Right lower leg: No edema.     Left lower leg: No edema.  Skin:    General: Skin is warm.     Findings: No rash.  Neurological:     Mental Status: He is alert  and oriented to person, place, and time.      ED Treatments / Results  Labs (all labs ordered are listed, but only abnormal results are displayed) Labs Reviewed  COMPREHENSIVE METABOLIC PANEL - Abnormal; Notable for the following components:      Result Value   Sodium 134 (*)    Chloride 97 (*)    BUN 24 (*)    Creatinine, Ser 1.28 (*)    AST 13 (*)    All other components within normal limits  CBC WITH DIFFERENTIAL/PLATELET - Abnormal; Notable for the following components:   RBC 6.07 (*)    Hemoglobin 17.8 (*)  HCT 52.7 (*)    All other components within normal limits  SARS CORONAVIRUS 2 (TAT 6-24 HRS)  BRAIN NATRIURETIC PEPTIDE  TROPONIN I (HIGH SENSITIVITY)  TROPONIN I (HIGH SENSITIVITY)    EKG EKG Interpretation  Date/Time:  Wednesday September 28 2019 11:11:18 EST Ventricular Rate:  89 PR Interval:    QRS Duration: 93 QT Interval:  382 QTC Calculation: 465 R Axis:   54 Text Interpretation: Sinus rhythm Borderline short PR interval Biatrial enlargement Confirmed by Blane OharaZavitz, Yogesh Cominsky [phone]) on 09/28/2019 12:16:40 PM   Radiology Dg Chest Portable 1 View  Result Date: 09/28/2019 CLINICAL DATA:  Shortness of breath EXAM: PORTABLE CHEST 1 VIEW COMPARISON:  September 22, 2019 FINDINGS: The heart size and mediastinal contours are within normal limits. Both lungs are clear. The visualized skeletal structures are unremarkable. IMPRESSION: No active disease. Electronically Signed   By: Jonna ClarkBindu  Avutu M.D.   On: 09/28/2019 11:38    Procedures Procedures (including critical care time)  Medications Ordered in ED Medications  HYDROcodone-acetaminophen (NORCO/VICODIN) 5-325 MG per tablet 2 tablet (has no administration in time range)  sodium chloride 0.9 % bolus 500 mL (0 mLs Intravenous Stopped 09/28/19 1234)  ondansetron (ZOFRAN) injection 4 mg (4 mg Intravenous Given 09/28/19 1134)  sodium chloride 0.9 % bolus 500 mL (500 mLs Intravenous New Bag/Given 09/28/19 1237)      Initial Impression / Assessment and Plan / ED Course  I have reviewed the triage vital signs and the nursing notes.  Pertinent labs & imaging results that were available during my care of the patient were reviewed by me and considered in my medical decision making (see chart for details).       Patient with COPD history presents with persistent vomiting shortness of breath cough.  Concern clinically for Covid/other viral process/pneumonia.  Lungs are clear on exam not consistent with COPD exacerbation.  Abdominal exam soft nontender however patient is vomiting.  Plan for IV fluids, antiemetics, blood work, Covid send out test and reassessment.  Reviewed recent CT scan abdomen pelvis no acute findings.  Patient's blood work reviewed troponin negative, creatinine mild elevated compared to previous at 1.28.  Patient given 1 L IV fluid bolus, tolerated oral fluids.  Patient improved on reassessment.  Pain meds ordered.  EKG no acute findings.  Patient stable for close follow-up with primary doctor and follow-up Covid testing.  Bea LauraBarry C Landers was evaluated in Emergency Department on 09/28/2019 for the symptoms described in the history of present illness. He was evaluated in the context of the global COVID-19 pandemic, which necessitated consideration that the patient might be at risk for infection with the SARS-CoV-2 virus that causes COVID-19. Institutional protocols and algorithms that pertain to the evaluation of patients at risk for COVID-19 are in a state of rapid change based on information released by regulatory bodies including the CDC and federal and state organizations. These policies and algorithms were followed during the patient's care in the ED.  Final Clinical Impressions(s) / ED Diagnoses   Final diagnoses:  Acute dyspnea  Vomiting in adult  Acute renal failure, unspecified acute renal failure type [facility](HCC)    ED Discharge Orders         Ordered    ondansetron (ZOFRAN ODT) 4 MG  disintegrating tablet     09/28/19 1417           Blane OharaZavitz, Sotiria Keast, MD 09/28/19 1420

## 2019-09-28 NOTE — Discharge Instructions (Signed)
-  Use Zofran as needed for nausea and vomiting. Follow-up closely with your primary doctor.  Return for new or worsening symptoms.

## 2019-09-28 NOTE — ED Triage Notes (Signed)
C/o SOB for about 2 weeks.  Tested on Friday for COVID POC was negative and send out is pending.  C/o weakness and poor oral intake. Decrease energy.

## 2019-10-07 DIAGNOSIS — Z6820 Body mass index (BMI) 20.0-20.9, adult: Secondary | ICD-10-CM | POA: Diagnosis not present

## 2019-10-07 DIAGNOSIS — M1991 Primary osteoarthritis, unspecified site: Secondary | ICD-10-CM | POA: Diagnosis not present

## 2019-10-07 DIAGNOSIS — K529 Noninfective gastroenteritis and colitis, unspecified: Secondary | ICD-10-CM | POA: Diagnosis not present

## 2019-10-07 DIAGNOSIS — G894 Chronic pain syndrome: Secondary | ICD-10-CM | POA: Diagnosis not present

## 2019-10-07 DIAGNOSIS — J449 Chronic obstructive pulmonary disease, unspecified: Secondary | ICD-10-CM | POA: Diagnosis not present

## 2019-10-18 DIAGNOSIS — J441 Chronic obstructive pulmonary disease with (acute) exacerbation: Secondary | ICD-10-CM | POA: Diagnosis not present

## 2020-01-21 ENCOUNTER — Other Ambulatory Visit: Payer: Self-pay

## 2020-01-21 ENCOUNTER — Emergency Department (HOSPITAL_COMMUNITY)
Admission: EM | Admit: 2020-01-21 | Discharge: 2020-01-21 | Disposition: A | Discharge status: Home / Self Care | Payer: Medicare Other | Attending: Emergency Medicine | Admitting: Emergency Medicine

## 2020-01-21 ENCOUNTER — Emergency Department (HOSPITAL_COMMUNITY): Payer: Medicare Other

## 2020-01-21 DIAGNOSIS — R112 Nausea with vomiting, unspecified: Secondary | ICD-10-CM

## 2020-01-21 DIAGNOSIS — J449 Chronic obstructive pulmonary disease, unspecified: Secondary | ICD-10-CM | POA: Insufficient documentation

## 2020-01-21 DIAGNOSIS — F17210 Nicotine dependence, cigarettes, uncomplicated: Secondary | ICD-10-CM | POA: Diagnosis not present

## 2020-01-21 DIAGNOSIS — Z79899 Other long term (current) drug therapy: Secondary | ICD-10-CM | POA: Diagnosis not present

## 2020-01-21 DIAGNOSIS — E860 Dehydration: Secondary | ICD-10-CM | POA: Insufficient documentation

## 2020-01-21 DIAGNOSIS — R109 Unspecified abdominal pain: Secondary | ICD-10-CM | POA: Diagnosis not present

## 2020-01-21 LAB — COMPREHENSIVE METABOLIC PANEL
ALT: 14 U/L (ref 0–44)
AST: 22 U/L (ref 15–41)
Albumin: 4.7 g/dL (ref 3.5–5.0)
Alkaline Phosphatase: 77 U/L (ref 38–126)
Anion gap: 13 (ref 5–15)
BUN: 22 mg/dL — ABNORMAL HIGH (ref 6–20)
CO2: 23 mmol/L (ref 22–32)
Calcium: 9.3 mg/dL (ref 8.9–10.3)
Chloride: 99 mmol/L (ref 98–111)
Creatinine, Ser: 1.02 mg/dL (ref 0.61–1.24)
GFR calc Af Amer: >60 mL/min (ref >60)
GFR calc non Af Amer: >60 mL/min (ref >60)
Glucose, Bld: 104 mg/dL — ABNORMAL HIGH (ref 70–99)
Potassium: 3.9 mmol/L (ref 3.5–5.1)
Sodium: 135 mmol/L (ref 135–145)
Total Bilirubin: 1.4 mg/dL — ABNORMAL HIGH (ref 0.3–1.2)
Total Protein: 7.7 g/dL (ref 6.5–8.1)

## 2020-01-21 LAB — CBC WITH DIFFERENTIAL/PLATELET
Abs Immature Granulocytes: 0.05 10*3/uL (ref 0.00–0.07)
Basophils Absolute: 0 10*3/uL (ref 0.0–0.1)
Basophils Relative: 0 %
Eosinophils Absolute: 0 10*3/uL (ref 0.0–0.5)
Eosinophils Relative: 0 %
HCT: 48.1 % (ref 39.0–52.0)
Hemoglobin: 16.3 g/dL (ref 13.0–17.0)
Immature Granulocytes: 1 %
Lymphocytes Relative: 14 %
Lymphs Abs: 1.5 10*3/uL (ref 0.7–4.0)
MCH: 30.1 pg (ref 26.0–34.0)
MCHC: 33.9 g/dL (ref 30.0–36.0)
MCV: 88.7 fL (ref 80.0–100.0)
Monocytes Absolute: 1 10*3/uL (ref 0.1–1.0)
Monocytes Relative: 9 %
Neutro Abs: 8.4 10*3/uL — ABNORMAL HIGH (ref 1.7–7.7)
Neutrophils Relative %: 76 %
Platelets: 422 10*3/uL — ABNORMAL HIGH (ref 150–400)
RBC: 5.42 MIL/uL (ref 4.22–5.81)
RDW: 12.1 % (ref 11.5–15.5)
WBC: 11.1 10*3/uL — ABNORMAL HIGH (ref 4.0–10.5)
nRBC: 0 % (ref 0.0–0.2)

## 2020-01-21 LAB — URINALYSIS, ROUTINE W REFLEX MICROSCOPIC
Bilirubin Urine: NEGATIVE
Glucose, UA: NEGATIVE mg/dL
Hgb urine dipstick: NEGATIVE
Ketones, ur: 20 mg/dL — AB
Leukocytes,Ua: NEGATIVE
Nitrite: NEGATIVE
Protein, ur: NEGATIVE mg/dL
Specific Gravity, Urine: 1.025 (ref 1.005–1.030)
pH: 6 (ref 5.0–8.0)

## 2020-01-21 LAB — LIPASE, BLOOD: Lipase: 39 U/L (ref 11–51)

## 2020-01-21 LAB — TROPONIN I (HIGH SENSITIVITY)
Troponin I (High Sensitivity): 4 ng/L (ref <18)
Troponin I (High Sensitivity): 5 ng/L (ref <18)

## 2020-01-21 MED ORDER — SUCRALFATE 1 G PO TABS: 1.0000 g | ORAL_TABLET | Freq: Three times a day (TID) | ORAL | 0 refills | Status: DC

## 2020-01-21 MED ORDER — ONDANSETRON HCL 4 MG/2ML IJ SOLN
4.0000 mg | Freq: Once | INTRAMUSCULAR | Status: AC
  Administered 2020-01-21: 14:00:00 4 mg via INTRAVENOUS
  Filled 2020-01-21: qty 2

## 2020-01-21 MED ORDER — ALUM & MAG HYDROXIDE-SIMETH 200-200-20 MG/5ML PO SUSP
30.0000 mL | Freq: Once | ORAL | Status: AC
  Administered 2020-01-21: 17:00:00 30 mL via ORAL
  Filled 2020-01-21: qty 30

## 2020-01-21 MED ORDER — MORPHINE SULFATE (PF) 4 MG/ML IV SOLN
4.0000 mg | Freq: Once | INTRAVENOUS | Status: AC
  Administered 2020-01-21: 15:00:00 4 mg via INTRAVENOUS
  Filled 2020-01-21: qty 1

## 2020-01-21 MED ORDER — SODIUM CHLORIDE 0.9 % IV BOLUS
1000.0000 mL | Freq: Once | INTRAVENOUS | Status: AC
  Administered 2020-01-21: 16:00:00 1000 mL via INTRAVENOUS

## 2020-01-21 MED ORDER — SODIUM CHLORIDE 0.9 % IV BOLUS
1000.0000 mL | Freq: Once | INTRAVENOUS | Status: AC
  Administered 2020-01-21: 1000 mL via INTRAVENOUS

## 2020-01-21 MED ORDER — ONDANSETRON 4 MG PO TBDP: 4.0000 mg | ORAL_TABLET | Freq: Three times a day (TID) | ORAL | 0 refills | Status: DC | PRN

## 2020-01-21 MED ORDER — ONDANSETRON HCL 4 MG/2ML IJ SOLN
4.0000 mg | Freq: Once | INTRAMUSCULAR | Status: AC
  Administered 2020-01-21: 16:00:00 4 mg via INTRAVENOUS
  Filled 2020-01-21: qty 2

## 2020-01-21 MED ORDER — FAMOTIDINE 20 MG PO TABS: 20.0000 mg | ORAL_TABLET | Freq: Two times a day (BID) | ORAL | 0 refills | Status: DC

## 2020-01-21 MED ORDER — LIDOCAINE VISCOUS HCL 2 % MT SOLN
15.0000 mL | Freq: Once | OROMUCOSAL | Status: AC
  Administered 2020-01-21: 17:00:00 15 mL via ORAL
  Filled 2020-01-21: qty 15

## 2020-01-21 MED ORDER — PROMETHAZINE HCL 25 MG/ML IJ SOLN
12.5000 mg | Freq: Once | INTRAMUSCULAR | Status: AC
  Administered 2020-01-21: 19:00:00 12.5 mg via INTRAVENOUS
  Filled 2020-01-21: qty 1

## 2020-01-21 MED ORDER — FAMOTIDINE 20 MG PO TABS
20.0000 mg | ORAL_TABLET | Freq: Once | ORAL | Status: AC
  Administered 2020-01-21: 20 mg via ORAL
  Filled 2020-01-21: qty 1

## 2020-01-21 NOTE — ED Notes (Signed)
Pt states he has been having mid abdominal pain, nausea and vomiting since Monday. Pt states he takes Oxycodone 10mg  6 tablets a day and Lyrica daily but has not been able to take it since Monday because of the nausea. Pt states he was here 3 times last year for the same thing. Pt states he hasn't vomited any in the last 24 hours just has had the dry heaves.

## 2020-01-21 NOTE — ED Provider Notes (Signed)
Received patient at signout from [facility].  Refer to provider note for full history and physical examination.  Briefly patient is a 59 year old male with history of degenerative disc disease and chronic pain, COPD, anxiety, arthritis presenting for evaluation of nausea vomiting and upper abdominal pain since Monday.  Has been able to keep down some fluids but not tolerating most food.  He has not been able to take his chronic narcotics due to his symptoms.  He had similar symptoms multiple times last year with negative work-up.  Has received IV fluids, Zofran, morphine.  In the ED his lab work is reassuring, serial troponins are negative and no concern for acute cardiopulmonary abnormalities.  He is pending lipase and p.o. challenge and will likely be stable for discharge home if he is tolerating fluid and light foods.  Physical Exam  BP 138/83   Pulse 74   Temp 97.7 F (36.5 C) (Oral)   Resp 12   Ht 6' (1.829 m)   Wt 65.8 kg   SpO2 99%   BMI 19.67 kg/m   Physical Exam Vitals and nursing note reviewed.  Constitutional:      General: He is not in acute distress.    Appearance: He is well-developed.     Comments: Cachectic, chronically ill in appearance  HENT:     Head: Normocephalic and atraumatic.  Eyes:     General:        Right eye: No discharge.        Left eye: No discharge.     Conjunctiva/sclera: Conjunctivae normal.  Neck:     Vascular: No JVD.     Trachea: No tracheal deviation.  Cardiovascular:     Rate and Rhythm: Normal rate.  Pulmonary:     Effort: Pulmonary effort is normal.  Chest:     Chest wall: No tenderness.  Abdominal:     General: There is no distension.     Palpations: Abdomen is soft.     Tenderness: There is no right CVA tenderness, left CVA tenderness, guarding or rebound.     Comments: Mild discomfort on palpation of the epigastrium focally around the xiphoid process.  Skin:    General: Skin is warm and dry.     Findings: No erythema.   Neurological:     Mental Status: He is alert.  Psychiatric:        Behavior: Behavior normal.     ED Course/Procedures     Procedures  MDM   Labs Reviewed  COMPREHENSIVE METABOLIC PANEL - Abnormal; Notable for the following components:      Result Value   Glucose, Bld 104 (*)    BUN 22 (*)    Total Bilirubin 1.4 (*)    All other components within normal limits  CBC WITH DIFFERENTIAL/PLATELET - Abnormal; Notable for the following components:   WBC 11.1 (*)    Platelets 422 (*)    Neutro Abs 8.4 (*)    All other components within normal limits  URINALYSIS, ROUTINE W REFLEX MICROSCOPIC - Abnormal; Notable for the following components:   Ketones, ur 20 (*)    All other components within normal limits  URINE CULTURE  LIPASE, BLOOD  TROPONIN I (HIGH SENSITIVITY)  TROPONIN I (HIGH SENSITIVITY)   Lipase is within normal limits.  On reevaluation patient is resting comfortably.  He has been able to tolerate sips of fluids and some crackers.  He would like to go home and attempt to treat his symptoms at this  time.  We discussed advancing diet slowly, pushing fluids.  He will follow-up with PCP for reevaluation of symptoms.  Discussed strict ED return precautions.  Patient verbalized understanding of and agreement with plan and is stable for discharge at this time.       Debroah Baller 01/21/20 1910    Davonna Belling, MD 01/21/20 2258

## 2020-01-21 NOTE — ED Notes (Signed)
Pt given sprite 

## 2020-01-21 NOTE — ED Notes (Signed)
ED Provider at bedside. 

## 2020-01-21 NOTE — Discharge Instructions (Signed)
1. Medications: Take Pepcid twice daily with meals for abdominal pain.  Take Carafate with meals and at night for abdominal pain.  Take Zofran as needed for nausea.  Let this medicine dissolve under your tongue and wait around 10-20 minutes before eating or drinking after taking this medication. 2. Treatment: rest, drink plenty of fluids, advance diet slowly.  Eat a diet of bland foods that will not upset your stomach.  Stick with mostly clear liquids such as broth, ginger ale, Gatorade and water today and then can transition to bland foods that will not upset your stomach.  Avoid spicy foods, fried foods, fatty foods, acidic foods or alcohol. 3. Follow Up: Please followup with your primary doctor in 3 days for discussion of your diagnoses and further evaluation after today's visit; if you do not have a primary care doctor use the resource guide provided to find one; Please return to the ER for persistent vomiting, high fevers or worsening symptoms

## 2020-01-21 NOTE — ED Triage Notes (Signed)
Patient comes to the ED with nausea and vomiting 5 days.  Patient reports epigastric pain.  Patient denies diarrhea.

## 2020-01-21 NOTE — ED Provider Notes (Signed)
[facility] EMERGENCY DEPARTMENT Provider Note   CSN: [id number] Arrival date & time: 01/21/20  1159     History Chief Complaint  Patient presents with  . Emesis    Wayne Rowe is a 59 y.o. male.  HPI 59 y.o. male with complaints/comments per nursing/medical assistant note, with all such history reviewed for accuracy and confirmed by myself.  The patient's relevant past medical, surgical and social history was reviewed in Epic.  HPI Comments: Wayne Rowe is a 59 y.o. male with a history of back pain, ADD, COPD who presents to the Emergency Department complaining of nausea and vomiting for 5 days.  Patient states that he has been experiencing the symptoms for the past 5 days.  He reports that he has been unable to eat food due to his nausea and vomiting and is drinking about 32 ounces of water a day.  For the past 24 hours he has continued to be nauseated but has been retching without any producible vomit.  He reports associated epigastric pain as well as inferior substernal pain.  His pain worsens with palpation of the prior mentioned regions.  He is not on home oxygen therapy for COPD and notes that he still smokes 1 pack/day.  He states he is only been able to smoke "2 to 3 cigarettes" since his symptoms began.  He is also on chronic oxycodone for pain management.  He also states he has been able to take any for the past 4 days due to his nausea.  He denies any fevers, chills, increased shortness of breath, URI symptoms, urinary changes, syncope.    Past Medical History:  Diagnosis Date  . Anxiety   . Arthritis   . Back pain   . DDD (degenerative disc disease)     Patient Active Problem List   Diagnosis Date Noted  . COPD with acute exacerbation (HCC) 03/14/2019  . Acute respiratory distress 03/14/2019  . Respiratory alkalosis 03/14/2019  . Hypokalemia 03/14/2019  . Pulmonary nodule 03/14/2019    Past Surgical History:  Procedure Laterality Date  . BACK SURGERY          No family history on file.  Social History   Tobacco Use  . Smoking status: Current Every Day Smoker    Packs/day: 1.00    Types: Cigarettes  . Smokeless tobacco: Never Used  Substance Use Topics  . Alcohol use: Not Currently  . Drug use: Yes    Types: Marijuana    Comment: denies use 09/22/19    Home Medications Prior to Admission medications   Medication Sig Start Date End Date Taking? Authorizing Provider  acetaminophen (TYLENOL) 325 MG tablet Take 2 tablets (650 mg total) by mouth every 6 (six) hours as needed for mild pain (or Fever >/= 101). 03/16/19   Shon Hale, MD  albuterol (PROVENTIL) (2.5 MG/3ML) 0.083% nebulizer solution Take 3 mLs (2.5 mg total) by nebulization every 6 (six) hours as needed for wheezing or shortness of breath. Dx---J 44.1 03/16/19   Emokpae, Courage, MD  carisoprodol (SOMA) 350 MG tablet Take 350 mg by mouth 4 (four) times daily as needed for muscle spasms.    [provider]  celecoxib (CELEBREX) 200 MG capsule Take 1 capsule (200 mg total) by mouth daily with breakfast. 03/16/19   Shon Hale, MD  clotrimazole-betamethasone (LOTRISONE) cream Apply 1 application topically 2 (two) times daily.  02/24/19   [provider]  ondansetron (ZOFRAN ODT) 4 MG disintegrating tablet Take 1 tablet (  4 mg total) by mouth every 8 (eight) hours as needed for nausea or vomiting. 09/22/19   Recardo Evangelist, PA-C  ondansetron (ZOFRAN ODT) 4 MG disintegrating tablet 4mg  ODT q4 hours prn nausea/vomit 09/28/19   Elnora Morrison, MD  Oxycodone HCl 10 MG TABS Take 10 mg by mouth 2 (two) times a day.  12/15/18   [provider]  pantoprazole (PROTONIX) 40 MG tablet Take 1 tablet (40 mg total) by mouth daily. 12/26/18   Pollina, Gwenyth Allegra, MD  potassium chloride SA (KLOR-CON) 20 MEQ tablet Take 2 tablets (40 mEq total) by mouth daily. 09/22/19   Recardo Evangelist, PA-C  predniSONE (DELTASONE) 20 MG tablet Take 2 tablets (40 mg total) by  mouth daily. 09/22/19   Recardo Evangelist, PA-C  pregabalin (LYRICA) 200 MG capsule Take 200 mg by mouth 2 (two) times daily.     [provider]  senna-docusate (SENOKOT-S) 8.6-50 MG tablet Take 2 tablets by mouth at bedtime. 03/16/19   Roxan Hockey, MD  tamsulosin (FLOMAX) 0.4 MG CAPS capsule Take 0.8 mg by mouth every evening.  04/12/15   [provider]  tiotropium (SPIRIVA HANDIHALER) 18 MCG inhalation capsule Place 1 capsule (18 mcg total) into inhaler and inhale daily. Patient not taking: Reported on 09/28/2019 03/16/19 03/15/20  Roxan Hockey, MD  TRELEGY ELLIPTA 100-62.5-25 MCG/INH AEPB Inhale 1 puff into the lungs daily. 09/16/19   [provider]  VENTOLIN HFA 108 (90 Base) MCG/ACT inhaler Inhale 1-2 puffs into the lungs every 6 (six) hours as needed for wheezing or shortness of breath.  03/08/19   [provider]    Allergies    Patient has no known allergies.  Review of Systems   Review of Systems  Constitutional: Negative for chills and fever.  HENT: Negative for congestion and sore throat.   Respiratory: Negative for shortness of breath.   Cardiovascular: Positive for chest pain.  Gastrointestinal: Positive for abdominal pain, nausea and vomiting. Negative for constipation and diarrhea.  Genitourinary: Negative for difficulty urinating, dysuria and hematuria.  Neurological: Negative for dizziness, syncope and numbness.  All other systems reviewed and are negative.  Physical Exam Updated Vital Signs BP (!) 120/101 (BP Location: Right Arm)   Pulse 90   Temp 97.7 F (36.5 C) (Oral)   Resp 14   Ht 6' (1.829 m)   Wt 65.8 kg   SpO2 100%   BMI 19.67 kg/m   Physical Exam Vitals and nursing note reviewed.  Constitutional:      General: He is in acute distress.     Appearance: He is not ill-appearing, toxic-appearing or diaphoretic.     Comments: Well-developed Caucasian male.  Cachectic appearing.  He appears fatigued.  He speaks  clearly and coherently.  HENT:     Head: Normocephalic and atraumatic.     Right Ear: Tympanic membrane, ear canal and external ear normal.     Left Ear: External ear normal.     Ears:     Comments: Right EAC is clear.  Right TM is nonerythematous and nonbulging.  Left EAC is occluded with cerumen.  Unable to visualize left TM.    Nose: Nose normal.     Mouth/Throat:     Mouth: Mucous membranes are dry.     Pharynx: Oropharynx is clear. No oropharyngeal exudate or posterior oropharyngeal erythema.  Eyes:     General: No scleral icterus.       Right eye: No discharge.  Left eye: No discharge.     Extraocular Movements: Extraocular movements intact.     Conjunctiva/sclera: Conjunctivae normal.     Pupils: Pupils are equal, round, and reactive to light.  Cardiovascular:     Rate and Rhythm: Normal rate and regular rhythm.     Pulses: Normal pulses.     Heart sounds: Normal heart sounds. No murmur. No friction rub. No gallop.      Comments: Moderate TTP noted overlying the sternal region.  Patient exhibits pectus excavatum. Pulmonary:     Effort: Pulmonary effort is normal. No respiratory distress.     Breath sounds: Normal breath sounds. No stridor. No wheezing, rhonchi or rales.     Comments: LCTAB Abdominal:     General: Abdomen is flat. Bowel sounds are normal.     Palpations: Abdomen is soft.     Tenderness: There is abdominal tenderness (Mild epigastric tenderness noted with deep palpation.).  Musculoskeletal:     Cervical back: Normal range of motion and neck supple. No tenderness.  Skin:    General: Skin is warm and dry.  Neurological:     General: No focal deficit present.     Mental Status: He is alert and oriented to person, place, and time.  Psychiatric:        Mood and Affect: Mood normal.        Behavior: Behavior normal.    ED Results / Procedures / Treatments   Labs (all labs ordered are listed, but only abnormal results are displayed) Labs Reviewed    COMPREHENSIVE METABOLIC PANEL - Abnormal; Notable for the following components:      Result Value   Glucose, Bld 104 (*)    BUN 22 (*)    Total Bilirubin 1.4 (*)    All other components within normal limits  CBC WITH DIFFERENTIAL/PLATELET - Abnormal; Notable for the following components:   WBC 11.1 (*)    Platelets 422 (*)    Neutro Abs 8.4 (*)    All other components within normal limits  URINALYSIS, ROUTINE W REFLEX MICROSCOPIC - Abnormal; Notable for the following components:   Ketones, ur 20 (*)    All other components within normal limits  URINE CULTURE  LIPASE, BLOOD  TROPONIN I (HIGH SENSITIVITY)  TROPONIN I (HIGH SENSITIVITY)   EKG EKG Interpretation  Date/Time:  Saturday January 21 2020 12:51:09 EDT Ventricular Rate:  78 PR Interval:    QRS Duration: 93 QT Interval:  398 QTC Calculation: 454 R Axis:   81 Text Interpretation: Sinus rhythm Borderline short PR interval Biatrial enlargement since last tracing no significant change Confirmed by Eber Hong ([id number]) on 01/21/2020 1:00:51 PM   Radiology DG Chest Portable 1 View  Result Date: 01/21/2020 CLINICAL DATA:  Mid abdominal pain, nausea and vomiting EXAM: PORTABLE CHEST 1 VIEW COMPARISON:  Prior chest x-ray 09/28/2019 FINDINGS: The lungs are clear and negative for focal airspace consolidation, pulmonary edema or suspicious pulmonary nodule. No pleural effusion or pneumothorax. Cardiac and mediastinal contours are within normal limits. No acute fracture or lytic or blastic osseous lesions. Osseous projection arising from the anterolateral aspect of the right first rib and overlying the right lung apex similar compared to prior imaging. The visualized upper abdominal bowel gas pattern is unremarkable. IMPRESSION: No active disease. Electronically Signed   By: Malachy Moan M.D.   On: 01/21/2020 13:25   Procedures Procedures   Medications Ordered in ED Medications  sodium chloride 0.9 % bolus 1,000 mL (has no  administration in time range)  ondansetron (ZOFRAN) injection 4 mg (has no administration in time range)  ondansetron (ZOFRAN) injection 4 mg (4 mg Intravenous Given 01/21/20 1344)  sodium chloride 0.9 % bolus 1,000 mL (1,000 mLs Intravenous New Bag/Given 01/21/20 1326)  morphine 4 MG/ML injection 4 mg (4 mg Intravenous Given 01/21/20 1433)    ED Course  I have reviewed the triage vital signs and the nursing notes.  Pertinent labs & imaging results that were available during my care of the patient were reviewed by me and considered in my medical decision making (see chart for details).    MDM Rules/Calculators/A&P                      12:57 PM patient is a 59 year old cachectic male with a history of COPD.  He presents today with 5 days of intractable nausea and vomiting as well as moderate epigastric and substernal pain.  He was seen with similar symptoms in December last year with no specific findings.  Will obtain basic labs, chest x-ray and will give patient IVF, morphine for pain, Zofran for nausea.  Will reassess.  3:36 PM patient states he is still moderately nauseated.  He was given ginger ale which he states he has not vomited.  His labs are generally reassuring.  Slight leukocytosis at 11.1.  Ketonuria, likely secondary from dehydration.  We will give additional fluids and Zofran. This patient was discussed with PA-C Luevenia Maxin who will be assuming his care moving forward. If patient can tolerate PO after his next fluid bolus, I believe patient will likely be appropriate for discharge.  Note: Portions of this report may have been transcribed using voice recognition software. Every effort was made to ensure accuracy; however, inadvertent computerized transcription errors may be present.   Final Clinical Impression(s) / ED Diagnoses Final diagnoses:  Non-intractable vomiting with nausea, unspecified vomiting type  Dehydration   Rx / DC Orders ED Discharge Orders    None       Placido Sou, PA-C 01/21/20 1556    Eber Hong, MD 01/25/20 [phone]

## 2020-01-23 LAB — URINE CULTURE: Culture: NO GROWTH

## 2020-03-12 NOTE — Progress Notes (Signed)
Patient referred by Cory Munch, PA-C for Orthostatic hypotension causing dizziness.   Subjective:   Wayne Rowe, male    DOB: 01-02-1961, 59 y.o.   MRN: [id number]  Chief Complaint  Patient presents with  . orthostatic hypotension  . Dizziness  . New Patient (Initial Visit)   HPI:   59 y.o. Caucasian male with history of degenerative disc disease and chronic pain, COPD, anxiety, arthritis  referred to [place] for evaluation of orthostatic hypotension and dizziness.   Patient states that he is been having dizziness when he changes positions over the last 1 year.  However, over the last 1 month its been getting more progressively worse.  Patient states that about 1 month ago he was try to get some stuff out of the refrigerator when he passed out possible opening the door.  Patient states that he lost consciousness for possibly a second and when he regained consciousness he could recall the events leading up to the syncopal events.  He did not lose any bowel or bladder function, did not have any tongue biting or evidence of bleeding, patient fell backward during that episode.  No identifiable cause is noted and patient was referred to cardiology for further evaluation.  Patient denies any use of illicit drugs, no new medications, no thyroid disease, no alcohol consumption, no energy drinks, no new over-the-counter medications or herbal supplements.  Patient states that he sometimes does have prodromal symptoms which include being clammy and sweaty.  Patient consumes 2 meals on a daily basis.  Drinks about 2-3 bottles of water and orange juice.  Patient has also lost approximately 25 pounds over the last couple months which was unintentional.  Patient continues to smoke on a daily basis and does not recall going lung cancer screening with CT of the lung.  He is also taking more medications such as Soma, Celebrex, oxycodone, Lyrica which may be contributing to his symptoms of dizziness.   We discussed possibly talking to his prescribing physician on possibly decreasing the doses given his symptoms and the fact he has lost 25 pounds.  No structured exercise program or daily routine.  Patient ambulates with a cane.  Denies prior history of coronary artery disease, myocardial infarction, congestive heart failure, deep venous thrombosis, pulmonary embolism, stroke, transient ischemic attack.  Past Medical History:  Diagnosis Date  . Anxiety   . Arthritis   . Back pain   . DDD (degenerative disc disease)    Past Surgical History:  Procedure Laterality Date  . BACK SURGERY     Social History   Tobacco Use  . Smoking status: Current Every Day Smoker    Packs/day: 1.00    Types: Cigarettes  . Smokeless tobacco: Never Used  Substance Use Topics  . Alcohol use: Not Currently  . Drug use: Not Currently    Types: Marijuana    Comment: denies use 09/22/19     Family History  Problem Relation Age of Onset  . Healthy Mother   . Hypertension Father   . Diabetes Father   . Hyperlipidemia Father   . Arthritis Sister   . Heart attack Maternal Grandfather   . Cancer Sister     Current Outpatient Medications on File Prior to Visit  Medication Sig Dispense Refill  . carisoprodol (SOMA) 350 MG tablet Take 350 mg by mouth 4 (four) times daily as needed for muscle spasms.    . celecoxib (CELEBREX) 200 MG capsule Take 1 capsule (200 mg total) by  mouth daily with breakfast. 30 capsule 2  . famotidine (PEPCID) 20 MG tablet Take 1 tablet (20 mg total) by mouth 2 (two) times daily. 30 tablet 0  . ondansetron (ZOFRAN ODT) 4 MG disintegrating tablet Take 1 tablet (4 mg total) by mouth every 8 (eight) hours as needed for nausea or vomiting. 10 tablet 0  . Oxycodone HCl 10 MG TABS Take 10 mg by mouth every 4 (four) hours.     . pregabalin (LYRICA) 200 MG capsule Take 200 mg by mouth 2 (two) times daily.     . sucralfate (CARAFATE) 1 g tablet Take 1 tablet (1 g total) by mouth 4 (four)  times daily -  with meals and at bedtime. 28 tablet 0  . tamsulosin (FLOMAX) 0.4 MG CAPS capsule Take 0.8 mg by mouth every evening.   0  . TRELEGY ELLIPTA 100-62.5-25 MCG/INH AEPB Inhale 1 puff into the lungs daily.    . VENTOLIN HFA 108 (90 Base) MCG/ACT inhaler Inhale 1-2 puffs into the lungs every 6 (six) hours as needed for wheezing or shortness of breath.      No current facility-administered medications on file prior to visit.    Cardiovascular and other pertinent studies:  EKG: EKG 03/14/2020: Sinus rhythm with short interval at the rate of 82 bpm,  right atrial enlargement, normal axis.  Incomplete right bundle branch block.  Poor R progression, cannot exclude anteroseptal infarct old.  Single PVC.     CMP Latest Ref Rng & Units 01/21/2020 09/28/2019 09/22/2019  Glucose 70 - 99 mg/dL 104(H) 97 162(H)  BUN 6 - 20 mg/dL 22(H) 24(H) 10  Creatinine 0.61 - 1.24 mg/dL 1.02 1.28(H) 0.99  Sodium 135 - 145 mmol/L 135 134(L) 139  Potassium 3.5 - 5.1 mmol/L 3.9 3.6 3.1(L)  Chloride 98 - 111 mmol/L 99 97(L) 105  CO2 22 - 32 mmol/L 23 23 21(L)  Calcium 8.9 - 10.3 mg/dL 9.3 9.4 9.1  Total Protein 6.5 - 8.1 g/dL 7.7 7.9 7.4  Total Bilirubin 0.3 - 1.2 mg/dL 1.4(H) 1.1 1.0  Alkaline Phos 38 - 126 U/L 77 79 88  AST 15 - 41 U/L 22 13(L) 18  ALT 0 - 44 U/L _0 CBC Latest Ref Rng & Units 01/21/2020 09/28/2019 09/22/2019  WBC 4.0 - 10.5 K/uL 11.1(H) 9.5 9.0  Hemoglobin 13.0 - 17.0 g/dL 16.3 17.8(H) 15.0  Hematocrit 39.0 - 52.0 % 48.1 52.7(H) 46.2  Platelets 150 - 400 K/uL 422(H) 396 345   Lipid Panel  No results found for: CHOL, TRIG, HDL, CHOLHDL, VLDL, LDLCALC, LDLDIRECT HEMOGLOBIN A1C No results found for: HGBA1C, MPG TSH No results for input(s): TSH in the last 8760 hours.   01/21/2020: Glucose 104, BUN/Cr 22/1.02. EGFR 60. Na/K 135/3.9. Rest of the CMP normal WBC 11.1. H/H 16/48. MCV 88.7. Platelets 422.  Review of Systems  Constitution: Positive for weight loss. Negative for  chills and fever.  HENT: Negative for hoarse voice and nosebleeds.   Eyes: Negative for discharge, double vision and pain.  Cardiovascular: Positive for syncope (2 months ago. ). Negative for chest pain, claudication, dyspnea on exertion, leg swelling, near-syncope, orthopnea, palpitations and paroxysmal nocturnal dyspnea.  Respiratory: Negative for hemoptysis and shortness of breath.   Musculoskeletal: Negative for muscle cramps and myalgias.  Gastrointestinal: Negative for abdominal pain, constipation, diarrhea, hematemesis, hematochezia, melena, nausea and vomiting.  Neurological: Positive for dizziness. Negative for light-headedness.   Vitals with BMI 03/14/2020 01/21/2020 01/21/2020  Height _1  - -  Weight 129 lbs 14 oz - -  BMI 17.61 - -  Systolic 99 607 371  Diastolic 62 85 87  Pulse 062 66 69    Orthostatic VS for the past 72 hrs (Last 3 readings):  Orthostatic BP Patient Position BP Location Cuff Size Orthostatic Pulse  03/14/20 0900 (!) 89/55 Standing Left Arm Normal 118  03/14/20 0859 91/64 Sitting Left Arm Normal 97  03/14/20 0858 106/64 Supine Left Arm Normal 83     Body mass index is 17.62 kg/m. Filed Weights   03/14/20 0849  Weight: 129 lb 14.4 oz (58.9 kg)    Objective:   Physical Exam  Constitutional: No distress.  Neck: No JVD present.  Cardiovascular: Normal rate, regular rhythm, normal heart sounds and intact distal pulses.  No murmur heard. Pulmonary/Chest: Effort normal and breath sounds normal. He has no wheezes. He has no rales.  Musculoskeletal:        General: No edema.  Nursing note and vitals reviewed.      Assessment & Recommendations:   Wayne Rowe is a 59 y.o. male whose past medical history and cardiac risk factors include: Chronic pain, COPD, orthostatic hypotension, dizziness, active smoking.    ICD-10-CM   1. Syncope and collapse  R55 EKG 12-Lead    PCV ECHOCARDIOGRAM COMPLETE    HOLTER MONITOR - 24 HOUR  2. Orthostatic dizziness   R42   3. Smokes cigarettes  F17.210    Dizziness:  Patient states that he gets a dizzy when he changes positions quickly.  Patient states that when he was at his primary care's office his vital signs were consistent with orthostatic hypotension.  Educated him on the importance of consuming 3 balanced meals on a daily basis and increasing fluid intake.  Labs from April 2021 also has evidence of prerenal azotemia.  He is not on diuretic therapy or vasodilators.  Echocardiogram will be ordered to evaluate for structural heart disease and left ventricular systolic function.  24-hour Holter monitor to evaluate for underlying arrhythmic burden.  We will hold off on ordering stress test at this time until the results of the echocardiogram are available.  Patient has lost about 25 pounds in the last 2 months unintentionally.  He smokes on a daily basis and has not been screened for lung cancer have asked him to discuss this further with his primary care provider.  Patient also recommended to undergo age-appropriate cancer screening.  He is also taking more medications such as Soma, Celebrex, oxycodone, Lyrica which may be contributing to his symptoms of dizziness.  We discussed possibly talking to his prescribing physician on possibly decreasing the doses given his symptoms and the fact he has lost 25 pounds.  Orthostatic vital signs at today's office visit were negative.  We will consider an artery duplex visit depending on symptoms.  Active cigarette smoking: Patient educated on importance of complete smoking cessation.  Orders Placed This Encounter  Procedures  . HOLTER MONITOR - 24 HOUR  . EKG 12-Lead  . PCV ECHOCARDIOGRAM COMPLETE   --Continue cardiac medications as reconciled in final medication list. --Return in about 4 weeks (around 04/11/2020) for re-evaluation of symptoms., review test results.. Or sooner if needed. --Continue follow-up with your primary care physician regarding  the management of your other chronic comorbid conditions.  Patient's questions and concerns were addressed to his satisfaction. He voices understanding of the instructions provided during this encounter.   This note was created using a voice recognition software as a  result there may be grammatical errors inadvertently enclosed that do not reflect the nature of this encounter. Every attempt is made to correct such errors.  Rex Kras, [place], [facility]  Pager: [phone] Office: [phone]

## 2020-03-14 ENCOUNTER — Encounter: Payer: Self-pay | Admitting: Cardiology

## 2020-03-14 ENCOUNTER — Other Ambulatory Visit: Payer: Self-pay

## 2020-03-14 ENCOUNTER — Ambulatory Visit: Payer: Medicare Other | Admitting: Cardiology

## 2020-03-14 ENCOUNTER — Ambulatory Visit: Payer: Medicare Other

## 2020-03-14 VITALS — BP 99/62 | HR 101 | Ht 72.0 in | Wt 129.9 lb

## 2020-03-14 DIAGNOSIS — R55 Syncope and collapse: Secondary | ICD-10-CM

## 2020-03-14 DIAGNOSIS — R42 Dizziness and giddiness: Secondary | ICD-10-CM

## 2020-03-14 DIAGNOSIS — F17210 Nicotine dependence, cigarettes, uncomplicated: Secondary | ICD-10-CM

## 2020-03-16 ENCOUNTER — Ambulatory Visit: Payer: Self-pay | Admitting: Cardiology

## 2020-03-21 ENCOUNTER — Other Ambulatory Visit: Payer: Self-pay

## 2020-03-21 ENCOUNTER — Ambulatory Visit: Payer: Medicare Other

## 2020-03-21 DIAGNOSIS — R55 Syncope and collapse: Secondary | ICD-10-CM

## 2020-04-10 ENCOUNTER — Ambulatory Visit: Payer: Medicare Other | Admitting: Cardiology

## 2020-04-10 ENCOUNTER — Other Ambulatory Visit (HOSPITAL_COMMUNITY): Payer: Self-pay | Admitting: Internal Medicine

## 2020-04-10 DIAGNOSIS — R059 Cough, unspecified: Secondary | ICD-10-CM

## 2020-06-11 ENCOUNTER — Emergency Department (HOSPITAL_COMMUNITY)
Admission: EM | Admit: 2020-06-11 | Discharge: 2020-06-11 | Disposition: A | Discharge status: Home / Self Care | Payer: Medicare Other | Attending: Emergency Medicine | Admitting: Emergency Medicine

## 2020-06-11 ENCOUNTER — Encounter (HOSPITAL_COMMUNITY): Payer: Self-pay

## 2020-06-11 ENCOUNTER — Other Ambulatory Visit: Payer: Self-pay

## 2020-06-11 DIAGNOSIS — Z5321 Procedure and treatment not carried out due to patient leaving prior to being seen by health care provider: Secondary | ICD-10-CM | POA: Insufficient documentation

## 2020-06-11 DIAGNOSIS — R109 Unspecified abdominal pain: Secondary | ICD-10-CM | POA: Diagnosis present

## 2020-06-11 LAB — CBC
HCT: 46.2 % (ref 39.0–52.0)
Hemoglobin: 15.1 g/dL (ref 13.0–17.0)
MCH: 29.8 pg (ref 26.0–34.0)
MCHC: 32.7 g/dL (ref 30.0–36.0)
MCV: 91.1 fL (ref 80.0–100.0)
Platelets: 357 10*3/uL (ref 150–400)
RBC: 5.07 MIL/uL (ref 4.22–5.81)
RDW: 12.7 % (ref 11.5–15.5)
WBC: 9 10*3/uL (ref 4.0–10.5)
nRBC: 0 % (ref 0.0–0.2)

## 2020-06-11 LAB — COMPREHENSIVE METABOLIC PANEL
ALT: 22 U/L (ref 0–44)
AST: 20 U/L (ref 15–41)
Albumin: 4.2 g/dL (ref 3.5–5.0)
Alkaline Phosphatase: 72 U/L (ref 38–126)
Anion gap: 10 (ref 5–15)
BUN: 14 mg/dL (ref 6–20)
CO2: 22 mmol/L (ref 22–32)
Calcium: 9.2 mg/dL (ref 8.9–10.3)
Chloride: 103 mmol/L (ref 98–111)
Creatinine, Ser: 0.87 mg/dL (ref 0.61–1.24)
GFR calc Af Amer: >60 mL/min (ref >60)
GFR calc non Af Amer: >60 mL/min (ref >60)
Glucose, Bld: 144 mg/dL — ABNORMAL HIGH (ref 70–99)
Potassium: 3.8 mmol/L (ref 3.5–5.1)
Sodium: 135 mmol/L (ref 135–145)
Total Bilirubin: 0.9 mg/dL (ref 0.3–1.2)
Total Protein: 7.3 g/dL (ref 6.5–8.1)

## 2020-06-11 LAB — LIPASE, BLOOD: Lipase: 36 U/L (ref 11–51)

## 2020-06-11 NOTE — ED Triage Notes (Signed)
Pt to er, pt states that he is here because he is vomiting, states that he had a ct of his abd last week at [facility] but they didn't find anything.  States that he has been vomiting for the past 4-5, but off and on for a long time, states that he has been here 5-6 times for the same thing without a definitive diagnosis.  States that no one has referred him to a [facility].

## 2020-06-13 ENCOUNTER — Encounter (INDEPENDENT_AMBULATORY_CARE_PROVIDER_SITE_OTHER): Payer: Self-pay | Admitting: Gastroenterology

## 2020-06-21 ENCOUNTER — Encounter (INDEPENDENT_AMBULATORY_CARE_PROVIDER_SITE_OTHER): Payer: Self-pay

## 2020-07-02 ENCOUNTER — Ambulatory Visit (INDEPENDENT_AMBULATORY_CARE_PROVIDER_SITE_OTHER): Payer: Medicare Other | Admitting: Gastroenterology

## 2020-07-04 ENCOUNTER — Ambulatory Visit (INDEPENDENT_AMBULATORY_CARE_PROVIDER_SITE_OTHER): Payer: Medicare Other | Admitting: Gastroenterology

## 2020-07-10 ENCOUNTER — Other Ambulatory Visit: Payer: Medicare Other

## 2020-07-10 ENCOUNTER — Other Ambulatory Visit: Payer: Self-pay

## 2020-07-10 DIAGNOSIS — Z20822 Contact with and (suspected) exposure to covid-19: Secondary | ICD-10-CM

## 2020-07-12 LAB — SARS-COV-2, NAA 2 DAY TAT

## 2020-07-12 LAB — SPECIMEN STATUS REPORT

## 2020-07-12 LAB — NOVEL CORONAVIRUS, NAA: SARS-CoV-2, NAA: NOT DETECTED

## 2020-10-01 ENCOUNTER — Emergency Department (HOSPITAL_COMMUNITY)
Admission: EM | Admit: 2020-10-01 | Discharge: 2020-10-01 | Disposition: A | Discharge status: Home / Self Care | Payer: Medicare Other | Attending: Emergency Medicine | Admitting: Emergency Medicine

## 2020-10-01 ENCOUNTER — Other Ambulatory Visit: Payer: Self-pay

## 2020-10-01 ENCOUNTER — Encounter (HOSPITAL_COMMUNITY): Payer: Self-pay | Admitting: Emergency Medicine

## 2020-10-01 DIAGNOSIS — R110 Nausea: Secondary | ICD-10-CM | POA: Diagnosis not present

## 2020-10-01 DIAGNOSIS — R1084 Generalized abdominal pain: Secondary | ICD-10-CM | POA: Diagnosis not present

## 2020-10-01 DIAGNOSIS — Z5321 Procedure and treatment not carried out due to patient leaving prior to being seen by health care provider: Secondary | ICD-10-CM | POA: Diagnosis not present

## 2020-10-01 NOTE — ED Triage Notes (Signed)
Pt c/o generalized abd pain for a while and nausea since Wed.

## 2020-11-07 DIAGNOSIS — J449 Chronic obstructive pulmonary disease, unspecified: Secondary | ICD-10-CM | POA: Diagnosis not present

## 2020-11-07 DIAGNOSIS — G894 Chronic pain syndrome: Secondary | ICD-10-CM | POA: Diagnosis not present

## 2020-11-07 DIAGNOSIS — M1991 Primary osteoarthritis, unspecified site: Secondary | ICD-10-CM | POA: Diagnosis not present

## 2020-11-07 DIAGNOSIS — N400 Enlarged prostate without lower urinary tract symptoms: Secondary | ICD-10-CM | POA: Diagnosis not present

## 2020-12-06 DIAGNOSIS — M1991 Primary osteoarthritis, unspecified site: Secondary | ICD-10-CM | POA: Diagnosis not present

## 2020-12-06 DIAGNOSIS — G894 Chronic pain syndrome: Secondary | ICD-10-CM | POA: Diagnosis not present

## 2020-12-06 DIAGNOSIS — M159 Polyosteoarthritis, unspecified: Secondary | ICD-10-CM | POA: Diagnosis not present

## 2020-12-06 DIAGNOSIS — R69 Illness, unspecified: Secondary | ICD-10-CM | POA: Diagnosis not present

## 2021-01-03 DIAGNOSIS — G894 Chronic pain syndrome: Secondary | ICD-10-CM | POA: Diagnosis not present

## 2021-01-03 DIAGNOSIS — N400 Enlarged prostate without lower urinary tract symptoms: Secondary | ICD-10-CM | POA: Diagnosis not present

## 2021-01-03 DIAGNOSIS — Z681 Body mass index (BMI) 19 or less, adult: Secondary | ICD-10-CM | POA: Diagnosis not present

## 2021-01-03 DIAGNOSIS — J449 Chronic obstructive pulmonary disease, unspecified: Secondary | ICD-10-CM | POA: Diagnosis not present

## 2021-01-03 DIAGNOSIS — M1991 Primary osteoarthritis, unspecified site: Secondary | ICD-10-CM | POA: Diagnosis not present

## 2021-01-03 DIAGNOSIS — I700 Atherosclerosis of aorta: Secondary | ICD-10-CM | POA: Diagnosis not present

## 2021-01-31 DIAGNOSIS — J449 Chronic obstructive pulmonary disease, unspecified: Secondary | ICD-10-CM | POA: Diagnosis not present

## 2021-01-31 DIAGNOSIS — N400 Enlarged prostate without lower urinary tract symptoms: Secondary | ICD-10-CM | POA: Diagnosis not present

## 2021-01-31 DIAGNOSIS — G894 Chronic pain syndrome: Secondary | ICD-10-CM | POA: Diagnosis not present

## 2021-01-31 DIAGNOSIS — M1991 Primary osteoarthritis, unspecified site: Secondary | ICD-10-CM | POA: Diagnosis not present

## 2021-02-28 DIAGNOSIS — M159 Polyosteoarthritis, unspecified: Secondary | ICD-10-CM | POA: Diagnosis not present

## 2021-02-28 DIAGNOSIS — G894 Chronic pain syndrome: Secondary | ICD-10-CM | POA: Diagnosis not present

## 2021-02-28 DIAGNOSIS — M1991 Primary osteoarthritis, unspecified site: Secondary | ICD-10-CM | POA: Diagnosis not present

## 2021-03-29 DIAGNOSIS — R69 Illness, unspecified: Secondary | ICD-10-CM | POA: Diagnosis not present

## 2021-03-29 DIAGNOSIS — Z1331 Encounter for screening for depression: Secondary | ICD-10-CM | POA: Diagnosis not present

## 2021-03-29 DIAGNOSIS — M1991 Primary osteoarthritis, unspecified site: Secondary | ICD-10-CM | POA: Diagnosis not present

## 2021-03-29 DIAGNOSIS — F17200 Nicotine dependence, unspecified, uncomplicated: Secondary | ICD-10-CM | POA: Diagnosis not present

## 2021-03-29 DIAGNOSIS — G894 Chronic pain syndrome: Secondary | ICD-10-CM | POA: Diagnosis not present

## 2021-03-29 DIAGNOSIS — J449 Chronic obstructive pulmonary disease, unspecified: Secondary | ICD-10-CM | POA: Diagnosis not present

## 2021-03-29 DIAGNOSIS — Z681 Body mass index (BMI) 19 or less, adult: Secondary | ICD-10-CM | POA: Diagnosis not present

## 2021-04-26 DIAGNOSIS — J449 Chronic obstructive pulmonary disease, unspecified: Secondary | ICD-10-CM | POA: Diagnosis not present

## 2021-04-26 DIAGNOSIS — M1991 Primary osteoarthritis, unspecified site: Secondary | ICD-10-CM | POA: Diagnosis not present

## 2021-04-26 DIAGNOSIS — G894 Chronic pain syndrome: Secondary | ICD-10-CM | POA: Diagnosis not present

## 2021-04-26 DIAGNOSIS — Z681 Body mass index (BMI) 19 or less, adult: Secondary | ICD-10-CM | POA: Diagnosis not present

## 2021-05-06 DIAGNOSIS — Z681 Body mass index (BMI) 19 or less, adult: Secondary | ICD-10-CM | POA: Diagnosis not present

## 2021-05-06 DIAGNOSIS — G894 Chronic pain syndrome: Secondary | ICD-10-CM | POA: Diagnosis not present

## 2021-05-27 DIAGNOSIS — N400 Enlarged prostate without lower urinary tract symptoms: Secondary | ICD-10-CM | POA: Diagnosis not present

## 2021-05-27 DIAGNOSIS — M1991 Primary osteoarthritis, unspecified site: Secondary | ICD-10-CM | POA: Diagnosis not present

## 2021-05-27 DIAGNOSIS — G894 Chronic pain syndrome: Secondary | ICD-10-CM | POA: Diagnosis not present

## 2021-06-26 DIAGNOSIS — M1991 Primary osteoarthritis, unspecified site: Secondary | ICD-10-CM | POA: Diagnosis not present

## 2021-06-26 DIAGNOSIS — G894 Chronic pain syndrome: Secondary | ICD-10-CM | POA: Diagnosis not present

## 2021-06-26 DIAGNOSIS — J449 Chronic obstructive pulmonary disease, unspecified: Secondary | ICD-10-CM | POA: Diagnosis not present

## 2021-07-25 DIAGNOSIS — M1991 Primary osteoarthritis, unspecified site: Secondary | ICD-10-CM | POA: Diagnosis not present

## 2021-07-25 DIAGNOSIS — G894 Chronic pain syndrome: Secondary | ICD-10-CM | POA: Diagnosis not present

## 2021-07-25 DIAGNOSIS — Z681 Body mass index (BMI) 19 or less, adult: Secondary | ICD-10-CM | POA: Diagnosis not present

## 2021-07-25 DIAGNOSIS — J449 Chronic obstructive pulmonary disease, unspecified: Secondary | ICD-10-CM | POA: Diagnosis not present

## 2021-07-25 DIAGNOSIS — M159 Polyosteoarthritis, unspecified: Secondary | ICD-10-CM | POA: Diagnosis not present

## 2021-07-25 DIAGNOSIS — N400 Enlarged prostate without lower urinary tract symptoms: Secondary | ICD-10-CM | POA: Diagnosis not present

## 2021-08-22 DIAGNOSIS — J449 Chronic obstructive pulmonary disease, unspecified: Secondary | ICD-10-CM | POA: Diagnosis not present

## 2021-08-22 DIAGNOSIS — G894 Chronic pain syndrome: Secondary | ICD-10-CM | POA: Diagnosis not present

## 2021-08-22 DIAGNOSIS — M159 Polyosteoarthritis, unspecified: Secondary | ICD-10-CM | POA: Diagnosis not present

## 2021-09-19 DIAGNOSIS — J449 Chronic obstructive pulmonary disease, unspecified: Secondary | ICD-10-CM | POA: Diagnosis not present

## 2021-09-19 DIAGNOSIS — J209 Acute bronchitis, unspecified: Secondary | ICD-10-CM | POA: Diagnosis not present

## 2021-09-19 DIAGNOSIS — M159 Polyosteoarthritis, unspecified: Secondary | ICD-10-CM | POA: Diagnosis not present

## 2021-09-19 DIAGNOSIS — Z681 Body mass index (BMI) 19 or less, adult: Secondary | ICD-10-CM | POA: Diagnosis not present

## 2021-09-19 DIAGNOSIS — R69 Illness, unspecified: Secondary | ICD-10-CM | POA: Diagnosis not present

## 2021-09-19 DIAGNOSIS — G894 Chronic pain syndrome: Secondary | ICD-10-CM | POA: Diagnosis not present

## 2021-10-10 DIAGNOSIS — Z681 Body mass index (BMI) 19 or less, adult: Secondary | ICD-10-CM | POA: Diagnosis not present

## 2021-10-10 DIAGNOSIS — J449 Chronic obstructive pulmonary disease, unspecified: Secondary | ICD-10-CM | POA: Diagnosis not present

## 2021-10-10 DIAGNOSIS — G894 Chronic pain syndrome: Secondary | ICD-10-CM | POA: Diagnosis not present

## 2021-10-10 DIAGNOSIS — M159 Polyosteoarthritis, unspecified: Secondary | ICD-10-CM | POA: Diagnosis not present

## 2021-10-29 DIAGNOSIS — M159 Polyosteoarthritis, unspecified: Secondary | ICD-10-CM | POA: Diagnosis not present

## 2021-10-29 DIAGNOSIS — G894 Chronic pain syndrome: Secondary | ICD-10-CM | POA: Diagnosis not present

## 2021-10-29 DIAGNOSIS — R69 Illness, unspecified: Secondary | ICD-10-CM | POA: Diagnosis not present

## 2022-02-04 DIAGNOSIS — G894 Chronic pain syndrome: Secondary | ICD-10-CM | POA: Diagnosis not present

## 2022-02-04 DIAGNOSIS — M159 Polyosteoarthritis, unspecified: Secondary | ICD-10-CM | POA: Diagnosis not present

## 2022-02-04 DIAGNOSIS — J449 Chronic obstructive pulmonary disease, unspecified: Secondary | ICD-10-CM | POA: Diagnosis not present

## 2022-04-14 ENCOUNTER — Other Ambulatory Visit (HOSPITAL_COMMUNITY): Payer: Self-pay | Admitting: Internal Medicine

## 2022-04-14 ENCOUNTER — Other Ambulatory Visit: Payer: Self-pay | Admitting: Internal Medicine

## 2022-04-14 DIAGNOSIS — J329 Chronic sinusitis, unspecified: Secondary | ICD-10-CM | POA: Diagnosis not present

## 2022-04-14 DIAGNOSIS — R059 Cough, unspecified: Secondary | ICD-10-CM

## 2022-04-14 DIAGNOSIS — Z681 Body mass index (BMI) 19 or less, adult: Secondary | ICD-10-CM | POA: Diagnosis not present

## 2022-04-14 DIAGNOSIS — N5089 Other specified disorders of the male genital organs: Secondary | ICD-10-CM

## 2022-04-14 DIAGNOSIS — J441 Chronic obstructive pulmonary disease with (acute) exacerbation: Secondary | ICD-10-CM | POA: Diagnosis not present

## 2022-04-14 DIAGNOSIS — F1120 Opioid dependence, uncomplicated: Secondary | ICD-10-CM | POA: Diagnosis not present

## 2022-04-14 DIAGNOSIS — R69 Illness, unspecified: Secondary | ICD-10-CM | POA: Diagnosis not present

## 2022-04-14 DIAGNOSIS — I700 Atherosclerosis of aorta: Secondary | ICD-10-CM | POA: Diagnosis not present

## 2022-04-14 DIAGNOSIS — J449 Chronic obstructive pulmonary disease, unspecified: Secondary | ICD-10-CM | POA: Diagnosis not present

## 2022-04-14 DIAGNOSIS — M159 Polyosteoarthritis, unspecified: Secondary | ICD-10-CM | POA: Diagnosis not present

## 2022-04-14 DIAGNOSIS — G894 Chronic pain syndrome: Secondary | ICD-10-CM | POA: Diagnosis not present

## 2022-04-17 ENCOUNTER — Encounter: Payer: Self-pay | Admitting: *Deleted

## 2022-04-28 ENCOUNTER — Ambulatory Visit: Payer: Medicare Other | Admitting: Urology

## 2022-04-28 DIAGNOSIS — N5089 Other specified disorders of the male genital organs: Secondary | ICD-10-CM

## 2022-05-14 ENCOUNTER — Ambulatory Visit (HOSPITAL_COMMUNITY)
Admission: RE | Admit: 2022-05-14 | Discharge: 2022-05-14 | Disposition: A | Discharge status: Home / Self Care | Payer: Medicare HMO | Source: Ambulatory Visit | Attending: Internal Medicine | Admitting: Internal Medicine

## 2022-05-14 DIAGNOSIS — R059 Cough, unspecified: Secondary | ICD-10-CM | POA: Insufficient documentation

## 2022-05-16 ENCOUNTER — Ambulatory Visit: Payer: Medicare HMO | Admitting: Urology

## 2022-05-19 ENCOUNTER — Encounter (HOSPITAL_COMMUNITY): Payer: Self-pay

## 2022-05-19 ENCOUNTER — Ambulatory Visit (HOSPITAL_COMMUNITY): Payer: Medicare HMO

## 2022-05-21 ENCOUNTER — Ambulatory Visit: Payer: Medicare HMO | Admitting: Urology

## 2022-05-29 DIAGNOSIS — N400 Enlarged prostate without lower urinary tract symptoms: Secondary | ICD-10-CM | POA: Diagnosis not present

## 2022-05-29 DIAGNOSIS — Z125 Encounter for screening for malignant neoplasm of prostate: Secondary | ICD-10-CM | POA: Diagnosis not present

## 2022-05-29 DIAGNOSIS — R69 Illness, unspecified: Secondary | ICD-10-CM | POA: Diagnosis not present

## 2022-05-29 DIAGNOSIS — M159 Polyosteoarthritis, unspecified: Secondary | ICD-10-CM | POA: Diagnosis not present

## 2022-05-29 DIAGNOSIS — T148XXA Other injury of unspecified body region, initial encounter: Secondary | ICD-10-CM | POA: Diagnosis not present

## 2022-05-29 DIAGNOSIS — Z681 Body mass index (BMI) 19 or less, adult: Secondary | ICD-10-CM | POA: Diagnosis not present

## 2022-05-29 DIAGNOSIS — Z1331 Encounter for screening for depression: Secondary | ICD-10-CM | POA: Diagnosis not present

## 2022-05-29 DIAGNOSIS — G894 Chronic pain syndrome: Secondary | ICD-10-CM | POA: Diagnosis not present

## 2022-05-29 DIAGNOSIS — J449 Chronic obstructive pulmonary disease, unspecified: Secondary | ICD-10-CM | POA: Diagnosis not present

## 2022-05-29 DIAGNOSIS — Z0001 Encounter for general adult medical examination with abnormal findings: Secondary | ICD-10-CM | POA: Diagnosis not present

## 2022-06-02 ENCOUNTER — Encounter: Payer: Self-pay | Admitting: Urology

## 2022-06-02 ENCOUNTER — Ambulatory Visit (INDEPENDENT_AMBULATORY_CARE_PROVIDER_SITE_OTHER): Payer: Medicare HMO | Admitting: Urology

## 2022-06-02 VITALS — BP 98/64 | HR 119 | Ht 73.0 in | Wt 120.0 lb

## 2022-06-02 DIAGNOSIS — N5089 Other specified disorders of the male genital organs: Secondary | ICD-10-CM | POA: Diagnosis not present

## 2022-06-02 DIAGNOSIS — R7309 Other abnormal glucose: Secondary | ICD-10-CM | POA: Diagnosis not present

## 2022-06-02 DIAGNOSIS — Z125 Encounter for screening for malignant neoplasm of prostate: Secondary | ICD-10-CM | POA: Diagnosis not present

## 2022-06-02 DIAGNOSIS — D518 Other vitamin B12 deficiency anemias: Secondary | ICD-10-CM | POA: Diagnosis not present

## 2022-06-02 DIAGNOSIS — Z0001 Encounter for general adult medical examination with abnormal findings: Secondary | ICD-10-CM | POA: Diagnosis not present

## 2022-06-02 DIAGNOSIS — E039 Hypothyroidism, unspecified: Secondary | ICD-10-CM | POA: Diagnosis not present

## 2022-06-02 DIAGNOSIS — E559 Vitamin D deficiency, unspecified: Secondary | ICD-10-CM | POA: Diagnosis not present

## 2022-06-02 NOTE — Progress Notes (Signed)
06/02/2022 9:41 AM   Bea Laura 01-Oct-1961 [id number]  Referring provider: Elfredia Nevins, MD [address] [place],  [place] [address]  Left testicular pain   HPI: Mr Darley I s a 50DT here for evaluation of a left testicular mass. He noted a left testis mass that has been growing in size for the past 6 months. It is tender to palpation. The pain is sharp, intermittent, mild to moderate and nonraditing. Pain is worse with activity. No known trauma. No significant LUTS. No other complaints today   PMH: Past Medical History:  Diagnosis Date   Anxiety    Arthritis    Back pain    DDD (degenerative disc disease)     Surgical History: Past Surgical History:  Procedure Laterality Date   BACK SURGERY      Home Medications:  Allergies as of 06/02/2022   No Known Allergies      Medication List        Accurate as of June 02, 2022  9:41 AM. If you have any questions, ask your nurse or doctor.          carisoprodol 350 MG tablet Commonly known as: SOMA Take 350 mg by mouth 4 (four) times daily as needed for muscle spasms.   celecoxib 200 MG capsule Commonly known as: CELEBREX Take 1 capsule (200 mg total) by mouth daily with breakfast.   famotidine 20 MG tablet Commonly known as: PEPCID Take 1 tablet (20 mg total) by mouth 2 (two) times daily.   ondansetron 4 MG disintegrating tablet Commonly known as: Zofran ODT Take 1 tablet (4 mg total) by mouth every 8 (eight) hours as needed for nausea or vomiting.   Oxycodone HCl 10 MG Tabs Take 10 mg by mouth every 4 (four) hours.   pregabalin 200 MG capsule Commonly known as: LYRICA Take 200 mg by mouth 2 (two) times daily.   sucralfate 1 g tablet Commonly known as: Carafate Take 1 tablet (1 g total) by mouth 4 (four) times daily -  with meals and at bedtime.   tamsulosin 0.4 MG Caps capsule Commonly known as: FLOMAX Take 0.8 mg by mouth every evening.   Trelegy Ellipta 100-62.5-25 MCG/ACT  Aepb Generic drug: Fluticasone-Umeclidin-Vilant Inhale 1 puff into the lungs daily.   Ventolin HFA 108 (90 Base) MCG/ACT inhaler Generic drug: albuterol Inhale 1-2 puffs into the lungs every 6 (six) hours as needed for wheezing or shortness of breath.        Allergies: No Known Allergies  Family History: Family History  Problem Relation Age of Onset   Healthy Mother    Hypertension Father    Diabetes Father    Hyperlipidemia Father    Arthritis Sister    Heart attack Maternal Grandfather    Cancer Sister     Social History:  reports that he has been smoking cigarettes. He has been smoking an average of 1 pack per day. He has never used smokeless tobacco. He reports that he does not currently use alcohol. He reports that he does not currently use drugs after having used the following drugs: Marijuana.  ROS: All other review of systems were reviewed and are negative except what is noted above in HPI  Physical Exam: BP 98/64   Pulse (!) 119   Ht 6\' 1"  (1.854 m)   Wt 120 lb (54.4 kg)   BMI 15.83 kg/m   Constitutional:  Alert and oriented, No acute distress. HEENT: [place] AT, moist mucus membranes.  Trachea midline, no masses. Cardiovascular: No clubbing, cyanosis, or edema. Respiratory: Normal respiratory effort, no increased work of breathing. GI: Abdomen is soft, nontender, nondistended, no abdominal masses GU: No CVA tenderness. Circumcised phallus. No masses/lesions on penis, testis, scrotum. 2.5cm left epididymal head mass Lymph: No cervical or inguinal lymphadenopathy. Skin: No rashes, bruises or suspicious lesions. Neurologic: Grossly intact, no focal deficits, moving all 4 extremities. Psychiatric: Normal mood and affect.  Laboratory Data: Lab Results  Component Value Date   WBC 9.0 06/11/2020   HGB 15.1 06/11/2020   HCT 46.2 06/11/2020   MCV 91.1 06/11/2020   PLT 357 06/11/2020    Lab Results  Component Value Date   CREATININE 0.87 06/11/2020    No  results found for: "PSA"  No results found for: "TESTOSTERONE"  No results found for: "HGBA1C"  Urinalysis    Component Value Date/Time   COLORURINE YELLOW 01/21/2020 1242   APPEARANCEUR CLEAR 01/21/2020 1242   LABSPEC 1.025 01/21/2020 1242   PHURINE 6.0 01/21/2020 1242   GLUCOSEU NEGATIVE 01/21/2020 1242   HGBUR NEGATIVE 01/21/2020 1242   BILIRUBINUR NEGATIVE 01/21/2020 1242   KETONESUR 20 (A) 01/21/2020 1242   PROTEINUR NEGATIVE 01/21/2020 1242   NITRITE NEGATIVE 01/21/2020 1242   LEUKOCYTESUR NEGATIVE 01/21/2020 1242    No results found for: "LABMICR", "WBCUA", "RBCUA", "LABEPIT", "MUCUS", "BACTERIA"  Pertinent Imaging:  No results found for this or any previous visit.  No results found for this or any previous visit.  No results found for this or any previous visit.  No results found for this or any previous visit.  No results found for this or any previous visit.  No results found for this or any previous visit.  No results found for this or any previous visit.  Results for orders placed during the hospital encounter of 11/28/16  CT Renal Stone Study  Narrative CLINICAL DATA:  Several days of left lower quadrant abdominal pain.  EXAM: CT ABDOMEN AND PELVIS WITHOUT CONTRAST  TECHNIQUE: Multidetector CT imaging of the abdomen and pelvis was performed following the standard protocol without IV contrast.  COMPARISON:  None.  FINDINGS: Lower chest: No significant pulmonary nodules or acute consolidative airspace disease.  Hepatobiliary: Normal liver with no liver mass. Normal gallbladder with no radiopaque cholelithiasis. No biliary ductal dilatation.  Pancreas: Normal, with no mass or duct dilation.  Spleen: Normal size. No mass.  Adrenals/Urinary Tract: Normal adrenals. Nonobstructing 2 mm upper right renal stone. No additional renal stones. No hydronephrosis. No contour deforming renal masses. Normal caliber ureters, with no ureteral stones.  Normal bladder with no definite bladder wall thickening or bladder stones.  Stomach/Bowel: Grossly normal stomach. Normal caliber small bowel with no small bowel wall thickening. Appendix not discretely visualized. No pericecal inflammatory changes. Normal large bowel with no diverticulosis, large bowel wall thickening or pericolonic fat stranding.  Vascular/Lymphatic: Atherosclerotic nonaneurysmal abdominal aorta. No pathologically enlarged lymph nodes in the abdomen or pelvis.  Reproductive: Normal size prostate.  Other: No pneumoperitoneum, ascites or focal fluid collection.  Musculoskeletal: No aggressive appearing focal osseous lesions. Marked thoracolumbar spondylosis.  IMPRESSION: 1. No acute abnormality. No evidence of bowel obstruction or acute bowel inflammation. 2. Nonobstructing punctate right renal stone. No hydronephrosis. No ureteral or bladder stones. 3. Aortic atherosclerosis.   Electronically Signed By: Delbert Phenix M.D. On: 11/28/2016 12:01   Assessment & Plan:    1. Scrotal mass -scrotal US, will call with results -If the [place] confirms a left epididymal cyst we discussed observation  versus excision and after discussing the treatment options the patient elects for excision. Risks/benefits/alternatives discussed.  - Urinalysis, Routine w reflex microscopic   No follow-ups on file.  Wilkie Aye, MD  [facility] [place]

## 2022-06-02 NOTE — Patient Instructions (Signed)
Spermatocele  A spermatocele is a fluid-filled sac (cyst) inside the sac that holds the testicles (scrotum). This type of cyst often forms in the epididymis. The epididymis is a coiled tube at the top of each testicle, and this tube is where sperm are stored. The cyst sometimes forms along a tube called the vas deferens, which is a tube that carries sperm away from the epididymis. Spermatoceles are usually painless. Most cysts are small, but they can grow larger. Spermatoceles are not cancerous (are benign). What are the causes? The cause of this condition is not known. However, this condition usually results from a blockage in one of the many small tubes (tubules) that carry sperm from your testicle to your vas deferens. What are the signs or symptoms? In most cases, small cysts do not cause symptoms. However, symptoms sometimes occur. Symptoms of this condition include: Dull pain. A feeling of heaviness. An enlarged scrotum, if your cyst is large. How is this diagnosed? This condition is diagnosed based on a physical exam. You or your health care provider may notice your cyst when feeling your scrotum. Your health care provider may shine a light through (transilluminate) your scrotum to see if light will pass through your cyst. You may have an ultrasound of the scrotum to rule out a tumor. How is this treated? Small spermatoceles do not need to be treated. If your spermatocele has grown large or is uncomfortable, your health care provider may recommend surgery to remove it. Follow these instructions at home: Check your spermatocele regularly for any changes. Do regular self-exams of your scrotum. Keep all follow-up visits. This is important. Contact a health care provider if: Your spermatocele gets larger. You have pain in your scrotum. Your spermatocele comes back after treatment. Get help right away if: You experience severe pain and redness of your scrotum. Summary A spermatocele  is a fluid-filled sac, or a cyst, inside the sac that holds the testicles (scrotum). This condition is usually painless, and it is not cancerous (is benign). Your health care provider may recommend surgery to remove your spermatocele if it grows large or is uncomfortable. If you have a spermatocele, check for any changes and do self-exams of your scrotum. Keep all follow-up visits. This is important. This information is not intended to replace advice given to you by your health care provider. Make sure you discuss any questions you have with your health care provider. Document Revised: 05/27/2021 Document Reviewed: 05/27/2021 Elsevier Patient Education  2023 [employer]   

## 2022-06-03 LAB — URINALYSIS, ROUTINE W REFLEX MICROSCOPIC
Bilirubin, UA: NEGATIVE
Glucose, UA: NEGATIVE
Ketones, UA: NEGATIVE
Leukocytes,UA: NEGATIVE
Nitrite, UA: NEGATIVE
Protein,UA: NEGATIVE
RBC, UA: NEGATIVE
Specific Gravity, UA: 1.015 (ref 1.005–1.030)
Urobilinogen, Ur: 0.2 mg/dL (ref 0.2–1.0)
pH, UA: 7 (ref 5.0–7.5)

## 2022-06-04 ENCOUNTER — Telehealth: Payer: Self-pay

## 2022-06-04 NOTE — Telephone Encounter (Signed)
I spoke with Wayne Rowe. We have discussed possible surgery dates and 06/26/2022 was agreed upon by all parties. Patient given information about surgery date, what to expect pre-operatively and post operatively.    We discussed that a pre-op nurse will be calling to set up the pre-op visit that will take place prior to surgery. Informed patient that our office will communicate any additional care to be provided after surgery.    Patients questions or concerns were discussed during our call. Advised to call our office should there be any additional information, questions or concerns that arise. Patient verbalized understanding.     Instructions mailed.

## 2022-06-20 NOTE — Patient Instructions (Signed)
Wayne Wayne  06/20/2022     @PREFPERIOPPHARMACY @   Your procedure is scheduled on  06/26/2022.   Report to 08/26/2022 at  0745 A.M.   Call this number if you have problems the morning of surgery:  [phone]   Remember:  Do not eat or drink after midnight.        Use your inhalers before you come and bring your rescue inhaler with you.     Take these medicines the morning of surgery with A SIP OF WATER            soma(if needed), pepcid, zofran (if needed), oxycodone(if needed).     Do not wear jewelry, make-up or nail polish.  Do not wear lotions, powders, or perfumes, or deodorant.  Do not shave 48 hours prior to surgery.  Men may shave face and neck.  Do not bring valuables to the hospital.  [facility] is not responsible for any belongings or valuables.  Contacts, dentures or bridgework may not be worn into surgery.  Leave your suitcase in the car.  After surgery it may be brought to your room.  For patients admitted to the hospital, discharge time will be determined by your treatment team.  Patients discharged the day of surgery will not be allowed to drive home and must have someone with them for 24 hours.    Special instructions:   DO NOT smoke tobacco or vape for 24 hours before your procedure.  Please read over the following fact sheets that you were given. Pain Booklet, Coughing and Deep Breathing, Surgical Site Infection Prevention, Anesthesia Post-op Instructions, and Care and Recovery After Surgery      Epididymectomy, Care After The following information offers guidance on how to care for yourself after your procedure. Your health care provider may also give you more specific instructions. If you have problems or questions, contact your health care provider. What can I expect after the procedure? After the procedure, it is common to have: Pain and swelling in the area where your incision was made. Some bruising around the penis and the sac  that holds your testicles (scrotum). Follow these instructions at home: Medicines Take over-the-counter and prescription medicines only as told by your health care provider. If you were prescribed an antibiotic medicine, take it as told by your health care provider. Do not stop using the antibiotic even if you start to feel better. Eating and drinking Follow instructions from your health care provider about eating or drinking restrictions. Drink enough fluid to keep your urine pale yellow. Bathing  Do not take baths, swim, or use a hot tub until your health care provider approves. You may shower 24 hours after your procedure. When you shower, gently rinse the scrotum and pat it dry. Do not scrub your scrotum. Incision care  Check your incision area every day for signs of infection. Check for: More redness, swelling, or pain. Fluid or blood. Warmth. Pus or a bad smell. Leave stitches (sutures) in place. They may need to stay in place for 2 weeks or longer. Usually, the sutures will dissolve and will not need to be removed. Managing pain, stiffness, and swelling If directed, raise (elevate) your scrotum and apply ice. To do this: Put ice in a plastic bag. Place a small towel or pillow between your legs. Rest your scrotum on the pillow or towel. Place another towel between your skin and the plastic bag. Leave the ice on  for 20 minutes, 2-3 times a day. Remove the ice if your skin turns bright red. This is very important. If you cannot feel pain, heat, or cold, you have a greater risk of damage to the area. Activity If you were given a sedative during the procedure, it can affect you for several hours. Do not drive or operate machinery until your health care provider says that it is safe. Rest as told by your health care provider. Do not do any sports for about 2 weeks. Do not have sex until your health care provider says it is okay. Return to your normal activities as told by your  health care provider. Ask your health care provider what activities are safe for you. General instructions Do not use any products that contain nicotine or tobacco. These products include cigarettes, chewing tobacco, and vaping devices, such as e-cigarettes. These can delay incision healing after surgery. If you need help quitting, ask your health care provider. Use scrotal support, such as a jockstrap or underwear with a supportive pouch, as told by your health care provider. Keep all follow-up visits. This is important. Contact a health care provider if: You continue to have increased swelling or bruising after 3 days. You have more redness, swelling, or pain around your incision. You have fluid or blood coming from your incision. Your incision feels warm to the touch. You have pus or a bad smell coming from your incision. Get help right away if: You have burning or bleeding when you urinate. You cannot urinate. You have a fever or chills. Summary After an epididymectomy, it is common to have pain and swelling in the area where your incision was made. Rest as told by your health care provider. Do not do any sports for about 2 weeks. Do not have sex until your health care provider says it is okay. Return to your normal activities as told by your health care provider. Ask your health care provider what activities are safe for you. Get help right away if you cannot urinate. This information is not intended to replace advice given to you by your health care provider. Make sure you discuss any questions you have with your health care provider. Document Revised: 05/15/2021 Document Reviewed: 05/15/2021 Elsevier Patient Education  2023 [employer] General Anesthesia, Adult, Care After The following information offers guidance on how to care for yourself after your procedure. Your health care provider may also give you more specific instructions. If you have problems or questions, contact your  health care provider. What can I expect after the procedure? After the procedure, it is common for people to: Have pain or discomfort at the IV site. Have nausea or vomiting. Have a sore throat or hoarseness. Have trouble concentrating. Feel cold or chills. Feel weak, sleepy, or tired (fatigue). Have soreness and body aches. These can affect parts of the body that were not involved in surgery. Follow these instructions at home: For the time period you were told by your health care provider:  Rest. Do not participate in activities where you could fall or become injured. Do not drive or use machinery. Do not drink alcohol. Do not take sleeping pills or medicines that cause drowsiness. Do not make important decisions or sign legal documents. Do not take care of children on your own. General instructions Drink enough fluid to keep your urine pale yellow. If you have sleep apnea, surgery and certain medicines can increase your risk for breathing problems. Follow instructions from your health  care provider about wearing your sleep device: Anytime you are sleeping, including during daytime naps. While taking prescription pain medicines, sleeping medicines, or medicines that make you drowsy. Return to your normal activities as told by your health care provider. Ask your health care provider what activities are safe for you. Take over-the-counter and prescription medicines only as told by your health care provider. Do not use any products that contain nicotine or tobacco. These products include cigarettes, chewing tobacco, and vaping devices, such as e-cigarettes. These can delay incision healing after surgery. If you need help quitting, ask your health care provider. Contact a health care provider if: You have nausea or vomiting that does not get better with medicine. You vomit every time you eat or drink. You have pain that does not get better with medicine. You cannot urinate or have bloody  urine. You develop a skin rash. You have a fever. Get help right away if: You have trouble breathing. You have chest pain. You vomit blood. These symptoms may be an emergency. Get help right away. Call 911. Do not wait to see if the symptoms will go away. Do not drive yourself to the hospital. Summary After the procedure, it is common to have a sore throat, hoarseness, nausea, vomiting, or to feel weak, sleepy, or fatigue. For the time period you were told by your health care provider, do not drive or use machinery. Get help right away if you have difficulty breathing, have chest pain, or vomit blood. These symptoms may be an emergency. This information is not intended to replace advice given to you by your health care provider. Make sure you discuss any questions you have with your health care provider. Document Revised: 01/03/2022 Document Reviewed: 01/03/2022 Elsevier Patient Education  2023 [employer].

## 2022-06-24 ENCOUNTER — Encounter (HOSPITAL_COMMUNITY)
Admission: RE | Admit: 2022-06-24 | Discharge: 2022-06-24 | Disposition: A | Discharge status: Home / Self Care | Payer: Medicare HMO | Source: Ambulatory Visit | Attending: Urology | Admitting: Urology

## 2022-06-24 ENCOUNTER — Encounter (HOSPITAL_COMMUNITY): Payer: Self-pay

## 2022-06-24 DIAGNOSIS — F17200 Nicotine dependence, unspecified, uncomplicated: Secondary | ICD-10-CM

## 2022-06-25 ENCOUNTER — Telehealth: Payer: Self-pay

## 2022-06-25 DIAGNOSIS — M159 Polyosteoarthritis, unspecified: Secondary | ICD-10-CM | POA: Diagnosis not present

## 2022-06-25 DIAGNOSIS — G894 Chronic pain syndrome: Secondary | ICD-10-CM | POA: Diagnosis not present

## 2022-06-25 DIAGNOSIS — J449 Chronic obstructive pulmonary disease, unspecified: Secondary | ICD-10-CM | POA: Diagnosis not present

## 2022-06-25 NOTE — Telephone Encounter (Signed)
Received message from pre op that patient did not show up to his pre op appt on 06/24/2022  I called patient but did not answer- left message to return call to discuss rescheduling surgery date as patient did not show up for pre-op

## 2022-06-25 NOTE — Pre-Procedure Instructions (Signed)
Wayne Rowe messaged on 9/5 because patient did not show for his pre-op.

## 2022-06-26 ENCOUNTER — Encounter (HOSPITAL_COMMUNITY): Admission: RE | Payer: Self-pay | Source: Home / Self Care

## 2022-06-26 ENCOUNTER — Ambulatory Visit (HOSPITAL_COMMUNITY): Admission: RE | Admit: 2022-06-26 | Payer: Medicare HMO | Source: Home / Self Care | Admitting: Urology

## 2022-06-26 SURGERY — EPIDIDYMECTOMY
Anesthesia: General | Laterality: Left

## 2022-06-30 ENCOUNTER — Ambulatory Visit (HOSPITAL_COMMUNITY): Admission: RE | Admit: 2022-06-30 | Payer: Medicare HMO | Source: Ambulatory Visit

## 2022-07-10 ENCOUNTER — Ambulatory Visit: Payer: Medicare HMO | Admitting: Physician Assistant

## 2022-09-04 DIAGNOSIS — J449 Chronic obstructive pulmonary disease, unspecified: Secondary | ICD-10-CM | POA: Diagnosis not present

## 2022-09-04 DIAGNOSIS — M159 Polyosteoarthritis, unspecified: Secondary | ICD-10-CM | POA: Diagnosis not present

## 2022-09-04 DIAGNOSIS — G894 Chronic pain syndrome: Secondary | ICD-10-CM | POA: Diagnosis not present

## 2022-10-27 DIAGNOSIS — I700 Atherosclerosis of aorta: Secondary | ICD-10-CM | POA: Diagnosis not present

## 2022-10-27 DIAGNOSIS — M159 Polyosteoarthritis, unspecified: Secondary | ICD-10-CM | POA: Diagnosis not present

## 2022-10-27 DIAGNOSIS — R69 Illness, unspecified: Secondary | ICD-10-CM | POA: Diagnosis not present

## 2022-10-27 DIAGNOSIS — Z681 Body mass index (BMI) 19 or less, adult: Secondary | ICD-10-CM | POA: Diagnosis not present

## 2022-10-27 DIAGNOSIS — G894 Chronic pain syndrome: Secondary | ICD-10-CM | POA: Diagnosis not present

## 2022-10-27 DIAGNOSIS — N400 Enlarged prostate without lower urinary tract symptoms: Secondary | ICD-10-CM | POA: Diagnosis not present

## 2022-10-27 DIAGNOSIS — J449 Chronic obstructive pulmonary disease, unspecified: Secondary | ICD-10-CM | POA: Diagnosis not present

## 2022-12-24 DIAGNOSIS — F1120 Opioid dependence, uncomplicated: Secondary | ICD-10-CM | POA: Diagnosis not present

## 2022-12-24 DIAGNOSIS — M159 Polyosteoarthritis, unspecified: Secondary | ICD-10-CM | POA: Diagnosis not present

## 2022-12-24 DIAGNOSIS — F5101 Primary insomnia: Secondary | ICD-10-CM | POA: Diagnosis not present

## 2022-12-24 DIAGNOSIS — N400 Enlarged prostate without lower urinary tract symptoms: Secondary | ICD-10-CM | POA: Diagnosis not present

## 2022-12-24 DIAGNOSIS — I700 Atherosclerosis of aorta: Secondary | ICD-10-CM | POA: Diagnosis not present

## 2022-12-24 DIAGNOSIS — J449 Chronic obstructive pulmonary disease, unspecified: Secondary | ICD-10-CM | POA: Diagnosis not present

## 2022-12-24 DIAGNOSIS — Z681 Body mass index (BMI) 19 or less, adult: Secondary | ICD-10-CM | POA: Diagnosis not present

## 2022-12-24 DIAGNOSIS — G894 Chronic pain syndrome: Secondary | ICD-10-CM | POA: Diagnosis not present

## 2022-12-24 DIAGNOSIS — R69 Illness, unspecified: Secondary | ICD-10-CM | POA: Diagnosis not present

## 2023-01-14 DIAGNOSIS — W010XXA Fall on same level from slipping, tripping and stumbling without subsequent striking against object, initial encounter: Secondary | ICD-10-CM | POA: Diagnosis not present

## 2023-01-14 DIAGNOSIS — W19XXXA Unspecified fall, initial encounter: Secondary | ICD-10-CM | POA: Diagnosis not present

## 2023-01-14 DIAGNOSIS — S4991XA Unspecified injury of right shoulder and upper arm, initial encounter: Secondary | ICD-10-CM | POA: Diagnosis not present

## 2023-01-14 DIAGNOSIS — Z886 Allergy status to analgesic agent status: Secondary | ICD-10-CM | POA: Diagnosis not present

## 2023-01-14 DIAGNOSIS — S51811A Laceration without foreign body of right forearm, initial encounter: Secondary | ICD-10-CM | POA: Diagnosis not present

## 2023-07-10 DIAGNOSIS — G894 Chronic pain syndrome: Secondary | ICD-10-CM | POA: Diagnosis not present

## 2023-07-10 DIAGNOSIS — M159 Polyosteoarthritis, unspecified: Secondary | ICD-10-CM | POA: Diagnosis not present

## 2023-07-10 DIAGNOSIS — J449 Chronic obstructive pulmonary disease, unspecified: Secondary | ICD-10-CM | POA: Diagnosis not present

## 2023-09-04 DIAGNOSIS — J209 Acute bronchitis, unspecified: Secondary | ICD-10-CM | POA: Diagnosis not present

## 2023-09-04 DIAGNOSIS — M1991 Primary osteoarthritis, unspecified site: Secondary | ICD-10-CM | POA: Diagnosis not present

## 2023-09-04 DIAGNOSIS — I700 Atherosclerosis of aorta: Secondary | ICD-10-CM | POA: Diagnosis not present

## 2023-09-04 DIAGNOSIS — G894 Chronic pain syndrome: Secondary | ICD-10-CM | POA: Diagnosis not present

## 2023-09-04 DIAGNOSIS — J441 Chronic obstructive pulmonary disease with (acute) exacerbation: Secondary | ICD-10-CM | POA: Diagnosis not present

## 2023-09-04 DIAGNOSIS — J449 Chronic obstructive pulmonary disease, unspecified: Secondary | ICD-10-CM | POA: Diagnosis not present

## 2023-09-04 DIAGNOSIS — Z681 Body mass index (BMI) 19 or less, adult: Secondary | ICD-10-CM | POA: Diagnosis not present

## 2023-09-04 DIAGNOSIS — M159 Polyosteoarthritis, unspecified: Secondary | ICD-10-CM | POA: Diagnosis not present

## 2023-11-27 DIAGNOSIS — J449 Chronic obstructive pulmonary disease, unspecified: Secondary | ICD-10-CM | POA: Diagnosis not present

## 2023-11-27 DIAGNOSIS — G894 Chronic pain syndrome: Secondary | ICD-10-CM | POA: Diagnosis not present

## 2023-11-27 DIAGNOSIS — I700 Atherosclerosis of aorta: Secondary | ICD-10-CM | POA: Diagnosis not present

## 2023-11-27 DIAGNOSIS — Z681 Body mass index (BMI) 19 or less, adult: Secondary | ICD-10-CM | POA: Diagnosis not present

## 2023-11-27 DIAGNOSIS — M159 Polyosteoarthritis, unspecified: Secondary | ICD-10-CM | POA: Diagnosis not present

## 2023-12-11 ENCOUNTER — Inpatient Hospital Stay (HOSPITAL_COMMUNITY): Payer: 59

## 2023-12-11 ENCOUNTER — Emergency Department (HOSPITAL_COMMUNITY): Payer: 59

## 2023-12-11 ENCOUNTER — Other Ambulatory Visit: Payer: Self-pay

## 2023-12-11 ENCOUNTER — Encounter (HOSPITAL_COMMUNITY): Payer: Self-pay | Admitting: Emergency Medicine

## 2023-12-11 ENCOUNTER — Inpatient Hospital Stay (HOSPITAL_COMMUNITY)
Admission: EM | Admit: 2023-12-11 | Discharge: 2023-12-20 | DRG: 163 | Disposition: A | Discharge status: Home / Self Care | Payer: 59 | Attending: Internal Medicine | Admitting: Internal Medicine

## 2023-12-11 DIAGNOSIS — G894 Chronic pain syndrome: Secondary | ICD-10-CM | POA: Diagnosis present

## 2023-12-11 DIAGNOSIS — J9601 Acute respiratory failure with hypoxia: Secondary | ICD-10-CM | POA: Diagnosis not present

## 2023-12-11 DIAGNOSIS — Z83438 Family history of other disorder of lipoprotein metabolism and other lipidemia: Secondary | ICD-10-CM

## 2023-12-11 DIAGNOSIS — R091 Pleurisy: Secondary | ICD-10-CM | POA: Diagnosis not present

## 2023-12-11 DIAGNOSIS — Z833 Family history of diabetes mellitus: Secondary | ICD-10-CM

## 2023-12-11 DIAGNOSIS — Z4682 Encounter for fitting and adjustment of non-vascular catheter: Secondary | ICD-10-CM | POA: Diagnosis not present

## 2023-12-11 DIAGNOSIS — J9383 Other pneumothorax: Secondary | ICD-10-CM | POA: Diagnosis present

## 2023-12-11 DIAGNOSIS — D649 Anemia, unspecified: Secondary | ICD-10-CM | POA: Diagnosis present

## 2023-12-11 DIAGNOSIS — Z720 Tobacco use: Secondary | ICD-10-CM | POA: Diagnosis not present

## 2023-12-11 DIAGNOSIS — J9312 Secondary spontaneous pneumothorax: Secondary | ICD-10-CM | POA: Diagnosis not present

## 2023-12-11 DIAGNOSIS — D72829 Elevated white blood cell count, unspecified: Secondary | ICD-10-CM | POA: Diagnosis present

## 2023-12-11 DIAGNOSIS — J939 Pneumothorax, unspecified: Secondary | ICD-10-CM | POA: Diagnosis present

## 2023-12-11 DIAGNOSIS — R9389 Abnormal findings on diagnostic imaging of other specified body structures: Secondary | ICD-10-CM | POA: Diagnosis not present

## 2023-12-11 DIAGNOSIS — R0789 Other chest pain: Secondary | ICD-10-CM | POA: Diagnosis not present

## 2023-12-11 DIAGNOSIS — T797XXA Traumatic subcutaneous emphysema, initial encounter: Secondary | ICD-10-CM | POA: Diagnosis not present

## 2023-12-11 DIAGNOSIS — Z8249 Family history of ischemic heart disease and other diseases of the circulatory system: Secondary | ICD-10-CM | POA: Diagnosis not present

## 2023-12-11 DIAGNOSIS — J439 Emphysema, unspecified: Secondary | ICD-10-CM | POA: Diagnosis not present

## 2023-12-11 DIAGNOSIS — E871 Hypo-osmolality and hyponatremia: Secondary | ICD-10-CM | POA: Diagnosis present

## 2023-12-11 DIAGNOSIS — Z09 Encounter for follow-up examination after completed treatment for conditions other than malignant neoplasm: Secondary | ICD-10-CM

## 2023-12-11 DIAGNOSIS — Z8261 Family history of arthritis: Secondary | ICD-10-CM

## 2023-12-11 DIAGNOSIS — E43 Unspecified severe protein-calorie malnutrition: Secondary | ICD-10-CM | POA: Diagnosis present

## 2023-12-11 DIAGNOSIS — J984 Other disorders of lung: Secondary | ICD-10-CM | POA: Diagnosis not present

## 2023-12-11 DIAGNOSIS — Z79899 Other long term (current) drug therapy: Secondary | ICD-10-CM | POA: Diagnosis not present

## 2023-12-11 DIAGNOSIS — E119 Type 2 diabetes mellitus without complications: Secondary | ICD-10-CM | POA: Diagnosis present

## 2023-12-11 DIAGNOSIS — F17210 Nicotine dependence, cigarettes, uncomplicated: Secondary | ICD-10-CM | POA: Diagnosis present

## 2023-12-11 DIAGNOSIS — J9311 Primary spontaneous pneumothorax: Secondary | ICD-10-CM | POA: Diagnosis not present

## 2023-12-11 DIAGNOSIS — R079 Chest pain, unspecified: Secondary | ICD-10-CM | POA: Diagnosis not present

## 2023-12-11 DIAGNOSIS — Z681 Body mass index (BMI) 19 or less, adult: Secondary | ICD-10-CM

## 2023-12-11 DIAGNOSIS — J9811 Atelectasis: Secondary | ICD-10-CM | POA: Diagnosis not present

## 2023-12-11 DIAGNOSIS — F419 Anxiety disorder, unspecified: Secondary | ICD-10-CM | POA: Diagnosis present

## 2023-12-11 DIAGNOSIS — J8410 Pulmonary fibrosis, unspecified: Secondary | ICD-10-CM | POA: Diagnosis not present

## 2023-12-11 DIAGNOSIS — J982 Interstitial emphysema: Secondary | ICD-10-CM | POA: Diagnosis present

## 2023-12-11 DIAGNOSIS — J449 Chronic obstructive pulmonary disease, unspecified: Secondary | ICD-10-CM | POA: Diagnosis not present

## 2023-12-11 DIAGNOSIS — R918 Other nonspecific abnormal finding of lung field: Secondary | ICD-10-CM | POA: Diagnosis not present

## 2023-12-11 DIAGNOSIS — R0600 Dyspnea, unspecified: Secondary | ICD-10-CM | POA: Diagnosis not present

## 2023-12-11 DIAGNOSIS — J930 Spontaneous tension pneumothorax: Secondary | ICD-10-CM | POA: Diagnosis not present

## 2023-12-11 HISTORY — DX: Emphysema, unspecified: J43.9

## 2023-12-11 HISTORY — DX: Chronic obstructive pulmonary disease, unspecified: J44.9

## 2023-12-11 HISTORY — DX: Unspecified asthma, uncomplicated: J45.909

## 2023-12-11 HISTORY — DX: Benign prostatic hyperplasia without lower urinary tract symptoms: N40.0

## 2023-12-11 HISTORY — DX: Prediabetes: R73.03

## 2023-12-11 LAB — CBC WITH DIFFERENTIAL/PLATELET
Abs Immature Granulocytes: 0.03 10*3/uL (ref 0.00–0.07)
Basophils Absolute: 0.1 10*3/uL (ref 0.0–0.1)
Basophils Relative: 1 %
Eosinophils Absolute: 0.1 10*3/uL (ref 0.0–0.5)
Eosinophils Relative: 1 %
HCT: 46.5 % (ref 39.0–52.0)
Hemoglobin: 15.6 g/dL (ref 13.0–17.0)
Immature Granulocytes: 0 %
Lymphocytes Relative: 13 %
Lymphs Abs: 1.2 10*3/uL (ref 0.7–4.0)
MCH: 30.5 pg (ref 26.0–34.0)
MCHC: 33.5 g/dL (ref 30.0–36.0)
MCV: 91 fL (ref 80.0–100.0)
Monocytes Absolute: 0.8 10*3/uL (ref 0.1–1.0)
Monocytes Relative: 8 %
Neutro Abs: 7.1 10*3/uL (ref 1.7–7.7)
Neutrophils Relative %: 77 %
Platelets: 398 10*3/uL (ref 150–400)
RBC: 5.11 MIL/uL (ref 4.22–5.81)
RDW: 12.2 % (ref 11.5–15.5)
WBC: 9.3 10*3/uL (ref 4.0–10.5)
nRBC: 0 % (ref 0.0–0.2)

## 2023-12-11 LAB — BASIC METABOLIC PANEL
Anion gap: 10 (ref 5–15)
BUN: 16 mg/dL (ref 8–23)
CO2: 27 mmol/L (ref 22–32)
Calcium: 9.1 mg/dL (ref 8.9–10.3)
Chloride: 100 mmol/L (ref 98–111)
Creatinine, Ser: 0.93 mg/dL (ref 0.61–1.24)
GFR, Estimated: >60 mL/min (ref >60)
Glucose, Bld: 111 mg/dL — ABNORMAL HIGH (ref 70–99)
Potassium: 4.4 mmol/L (ref 3.5–5.1)
Sodium: 137 mmol/L (ref 135–145)

## 2023-12-11 LAB — TROPONIN I (HIGH SENSITIVITY)
Troponin I (High Sensitivity): 20 ng/L — ABNORMAL HIGH (ref <18)
Troponin I (High Sensitivity): 19 ng/L — ABNORMAL HIGH (ref <18)

## 2023-12-11 MED ORDER — CARISOPRODOL 350 MG PO TABS
350.0000 mg | ORAL_TABLET | Freq: Four times a day (QID) | ORAL | Status: DC
Start: 2023-12-11 — End: 2023-12-21
  Administered 2023-12-11 – 2023-12-20 (×34): 350 mg via ORAL
  Filled 2023-12-11 (×34): qty 1

## 2023-12-11 MED ORDER — OXYCODONE HCL 5 MG PO TABS
10.0000 mg | ORAL_TABLET | Freq: Four times a day (QID) | ORAL | Status: DC
  Administered 2023-12-11 – 2023-12-14 (×12): 10 mg via ORAL
  Filled 2023-12-11 (×12): qty 2

## 2023-12-11 MED ORDER — HYDROMORPHONE HCL 1 MG/ML IJ SOLN
1.0000 mg | INTRAMUSCULAR | Status: DC | PRN
  Administered 2023-12-13: 1 mg via INTRAVENOUS
  Filled 2023-12-11: qty 1

## 2023-12-11 MED ORDER — LIDOCAINE-PRILOCAINE 2.5-2.5 % EX CREA
1.0000 | TOPICAL_CREAM | Freq: Four times a day (QID) | CUTANEOUS | Status: DC | PRN
  Administered 2023-12-13 – 2023-12-20 (×5): 1 via TOPICAL
  Filled 2023-12-11 (×2): qty 5

## 2023-12-11 MED ORDER — MORPHINE SULFATE (PF) 4 MG/ML IV SOLN
4.0000 mg | Freq: Once | INTRAVENOUS | Status: AC
  Administered 2023-12-11: 4 mg via INTRAVENOUS
  Filled 2023-12-11: qty 1

## 2023-12-11 MED ORDER — ALBUTEROL SULFATE (2.5 MG/3ML) 0.083% IN NEBU
3.0000 mL | INHALATION_SOLUTION | Freq: Four times a day (QID) | RESPIRATORY_TRACT | Status: DC | PRN
  Filled 2023-12-11: qty 3

## 2023-12-11 MED ORDER — TAMSULOSIN HCL 0.4 MG PO CAPS
0.4000 mg | ORAL_CAPSULE | Freq: Every evening | ORAL | Status: DC
  Administered 2023-12-11 – 2023-12-20 (×10): 0.4 mg via ORAL
  Filled 2023-12-11 (×10): qty 1

## 2023-12-11 MED ORDER — LABETALOL HCL 5 MG/ML IV SOLN: 10.0000 mg | INTRAVENOUS | Status: DC | PRN

## 2023-12-11 MED ORDER — OXYCODONE-ACETAMINOPHEN 5-325 MG PO TABS
1.0000 | ORAL_TABLET | Freq: Four times a day (QID) | ORAL | Status: DC | PRN
Start: 2023-12-11 — End: 2023-12-14
  Administered 2023-12-11: 1 via ORAL
  Administered 2023-12-12 – 2023-12-13 (×6): 2 via ORAL
  Filled 2023-12-11: qty 1
  Filled 2023-12-11 (×6): qty 2

## 2023-12-11 MED ORDER — HYDRALAZINE HCL 20 MG/ML IJ SOLN: 10.0000 mg | Freq: Four times a day (QID) | INTRAMUSCULAR | Status: DC | PRN

## 2023-12-11 MED ORDER — ENOXAPARIN SODIUM 40 MG/0.4ML IJ SOSY
40.0000 mg | PREFILLED_SYRINGE | INTRAMUSCULAR | Status: DC
  Administered 2023-12-11 – 2023-12-19 (×7): 40 mg via SUBCUTANEOUS
  Filled 2023-12-11 (×8): qty 0.4

## 2023-12-11 MED ORDER — FLUTICASONE FUROATE-VILANTEROL 100-25 MCG/ACT IN AEPB
1.0000 | INHALATION_SPRAY | Freq: Every day | RESPIRATORY_TRACT | Status: DC
  Administered 2023-12-12 – 2023-12-20 (×9): 1 via RESPIRATORY_TRACT
  Filled 2023-12-11 (×2): qty 28

## 2023-12-11 MED ORDER — MORPHINE SULFATE (PF) 4 MG/ML IV SOLN: 4.0000 mg | INTRAVENOUS | Status: DC | PRN

## 2023-12-11 MED ORDER — SODIUM CHLORIDE 0.9% FLUSH
10.0000 mL | Freq: Three times a day (TID) | INTRAVENOUS | Status: DC
  Administered 2023-12-11 – 2023-12-20 (×16): 10 mL via INTRAPLEURAL

## 2023-12-11 MED ORDER — KETOROLAC TROMETHAMINE 30 MG/ML IJ SOLN
30.0000 mg | Freq: Four times a day (QID) | INTRAMUSCULAR | Status: AC
Start: 2023-12-11 — End: 2023-12-16
  Administered 2023-12-11 – 2023-12-16 (×20): 30 mg via INTRAVENOUS
  Filled 2023-12-11 (×20): qty 1

## 2023-12-11 MED ORDER — ONDANSETRON 4 MG PO TBDP
4.0000 mg | ORAL_TABLET | Freq: Three times a day (TID) | ORAL | Status: DC | PRN
Start: 2023-12-11 — End: 2023-12-21
  Administered 2023-12-13 (×2): 4 mg via ORAL
  Filled 2023-12-11 (×2): qty 1

## 2023-12-11 MED ORDER — UMECLIDINIUM BROMIDE 62.5 MCG/ACT IN AEPB
1.0000 | INHALATION_SPRAY | Freq: Every day | RESPIRATORY_TRACT | Status: DC
  Administered 2023-12-12 – 2023-12-20 (×9): 1 via RESPIRATORY_TRACT
  Filled 2023-12-11 (×2): qty 7

## 2023-12-11 NOTE — ED Provider Notes (Signed)
[place] EMERGENCY DEPARTMENT AT [facility] Provider Note   CSN: [id number] Arrival date & time: 12/11/23  6578     History  Chief Complaint  Patient presents with   Chest Pain    Wayne Rowe is a 63 y.o. male.  He has a history of COPD continues to smoke.  Complaining of acute onset of stabbing right-sided chest pain worse with deep breath that started yesterday.  No trauma.  Chronic cough.  He is not sure he has had a fever.  No history of cardiac disease.  The history is provided by the patient.  Chest Pain Pain location:  R chest Pain quality: stabbing   Pain severity:  Severe Onset quality:  Sudden Duration:  1 day Timing:  Constant Progression:  Unchanged Chronicity:  New Relieved by:  Nothing Worsened by:  Deep breathing Ineffective treatments:  None tried Associated symptoms: cough and shortness of breath   Associated symptoms: no diaphoresis, no dizziness, no fever, no nausea and no vomiting   Risk factors: smoking        Home Medications Prior to Admission medications   Medication Sig Start Date End Date Taking? Authorizing Provider  carisoprodol (SOMA) 350 MG tablet Take 350 mg by mouth 4 (four) times daily as needed for muscle spasms.    [provider]  celecoxib (CELEBREX) 200 MG capsule Take 1 capsule (200 mg total) by mouth daily with breakfast. Patient not taking: Reported on 06/02/2022 03/16/19   Shon Hale, MD  famotidine (PEPCID) 20 MG tablet Take 1 tablet (20 mg total) by mouth 2 (two) times daily. 01/21/20   Fawze, Mina A, PA-C  ondansetron (ZOFRAN ODT) 4 MG disintegrating tablet Take 1 tablet (4 mg total) by mouth every 8 (eight) hours as needed for nausea or vomiting. 01/21/20   Fawze, Mina A, PA-C  Oxycodone HCl 10 MG TABS Take 10 mg by mouth every 4 (four) hours.  12/15/18   [provider]  pregabalin (LYRICA) 200 MG capsule Take 200 mg by mouth 2 (two) times daily.  Patient not taking: Reported on 06/02/2022     [provider]  sucralfate (CARAFATE) 1 g tablet Take 1 tablet (1 g total) by mouth 4 (four) times daily -  with meals and at bedtime. Patient not taking: Reported on 06/02/2022 01/21/20   Michela Pitcher A, PA-C  tamsulosin (FLOMAX) 0.4 MG CAPS capsule Take 0.8 mg by mouth every evening.  04/12/15   [provider]  TRELEGY ELLIPTA 100-62.5-25 MCG/INH AEPB Inhale 1 puff into the lungs daily. 09/16/19   [provider]  VENTOLIN HFA 108 (90 Base) MCG/ACT inhaler Inhale 1-2 puffs into the lungs every 6 (six) hours as needed for wheezing or shortness of breath.  03/08/19   [provider]      Allergies    Patient has no known allergies.    Review of Systems   Review of Systems  Constitutional:  Negative for diaphoresis and fever.  Respiratory:  Positive for cough and shortness of breath.   Cardiovascular:  Positive for chest pain.  Gastrointestinal:  Negative for nausea and vomiting.  Neurological:  Negative for dizziness.    Physical Exam Updated Vital Signs BP 128/85 (BP Location: Right Arm)   Pulse 93   Temp 97.6 F (36.4 C) (Oral)   Resp (!) 28   Ht 6\' 1"  (1.854 m)   Wt 56.7 kg   SpO2 93%   BMI 16.49 kg/m  Physical Exam Vitals  and nursing note reviewed.  Constitutional:      General: He is not in acute distress.    Appearance: He is well-developed.  HENT:     Head: Normocephalic and atraumatic.  Eyes:     Conjunctiva/sclera: Conjunctivae normal.  Cardiovascular:     Rate and Rhythm: Normal rate and regular rhythm.     Heart sounds: No murmur heard. Pulmonary:     Effort: Pulmonary effort is normal. No respiratory distress.     Breath sounds: Examination of the right-upper field reveals decreased breath sounds. Examination of the right-middle field reveals decreased breath sounds. Decreased breath sounds present.  Abdominal:     Palpations: Abdomen is soft.     Tenderness: There is no abdominal tenderness.  Musculoskeletal:         General: No swelling.     Cervical back: Neck supple.     Right lower leg: No tenderness. No edema.     Left lower leg: No tenderness. No edema.  Skin:    General: Skin is warm and dry.     Capillary Refill: Capillary refill takes less than 2 seconds.  Neurological:     General: No focal deficit present.     Mental Status: He is alert.  Psychiatric:        Mood and Affect: Mood normal.     ED Results / Procedures / Treatments   Labs (all labs ordered are listed, but only abnormal results are displayed) Labs Reviewed  BASIC METABOLIC PANEL - Abnormal; Notable for the following components:      Result Value   Glucose, Bld 111 (*)    All other components within normal limits  TROPONIN I (HIGH SENSITIVITY) - Abnormal; Notable for the following components:   Troponin I (High Sensitivity) 20 (*)    All other components within normal limits  TROPONIN I (HIGH SENSITIVITY) - Abnormal; Notable for the following components:   Troponin I (High Sensitivity) 19 (*)    All other components within normal limits  CBC WITH DIFFERENTIAL/PLATELET  COMPREHENSIVE METABOLIC PANEL  CBC    EKG EKG Interpretation Date/Time:  Friday December 11 2023 07:58:20 EST Ventricular Rate:  92 PR Interval:  126 QRS Duration:  91 QT Interval:  359 QTC Calculation: 445 R Axis:   93  Text Interpretation: Sinus rhythm Right atrial enlargement Right axis deviation Borderline repolarization abnormality No significant change since prior 4/21 Confirmed by Meridee Score [phone]) on 12/11/2023 8:05:27 AM  Radiology DG Chest Portable 1 View Result Date: 12/11/2023 CLINICAL DATA:  Right chest tube placement for pneumothorax. EXAM: PORTABLE CHEST 1 VIEW COMPARISON:  12/11/2023 at 8:26 a.m. FINDINGS: New right pigtail chest tube projects along the lateral mid right hemithorax. Right lung has been mostly re-expanded. There is a pleural line that persists along right upper lung consistent with a small residual  pneumothorax. Right lung base opacity is noted consistent with atelectasis. Left lung is hyperexpanded, but clear. There is no residual midline shift to the left. IMPRESSION: 1. Significant re-expansion of the right lung following right-sided chest tube placement. Small residual pneumothorax. No residual midline shift. Electronically Signed   By: Amie Portland M.D.   On: 12/11/2023 10:07   DG Chest Port 1 View Result Date: 12/11/2023 CLINICAL DATA:  Right-sided chest pain beginning yesterday afternoon. EXAM: PORTABLE CHEST 1 VIEW COMPARISON:  05/14/2022. FINDINGS: Large right pneumothorax. Mild mediastinal shift to the left suggest a component of tension. Prominent interstitial markings throughout the left lung. No lung consolidation.  Cardiac silhouette normal in size.  No mediastinal or hilar masses. Skeletal structures grossly intact. IMPRESSION: 1. Large right sided pneumothorax with evidence of tension. Critical Value/emergent results were called by telephone at the time of interpretation on 12/11/2023 at 8:46 am to provider [facility] , who verbally acknowledged these results. Electronically Signed   By: Amie Portland M.D.   On: 12/11/2023 08:46    Procedures CHEST TUBE INSERTION  Date/Time: 12/11/2023 9:23 AM  Performed by: Terrilee Files, MD Authorized by: Terrilee Files, MD   Consent:    Consent obtained:  Written   Consent given by:  Patient   Risks discussed:  Bleeding, damage to surrounding structures, infection, incomplete drainage, nerve damage and pain   Alternatives discussed:  Delayed treatment, alternative treatment, observation and no treatment Universal protocol:    Procedure explained and questions answered to patient or proxy's satisfaction: yes     Relevant documents present and verified: yes     Imaging studies available: yes     Required blood products, implants, devices, and special equipment available: yes     Site/side marked: yes     Immediately prior to  procedure, a time out was called: yes     Patient identity confirmed:  Verbally with patient Pre-procedure details:    Skin preparation:  Chlorhexidine   Preparation: Patient was prepped and draped in the usual sterile fashion   Sedation:    Sedation type:  None Anesthesia:    Anesthesia method:  Local infiltration   Local anesthetic:  Lidocaine 1% w/o epi Procedure details:    Placement location:  R lateral   Tube size (Fr):  Minicatheter   Tube connected to:  Suction   Drainage characteristics:  Air only   Dressing:  4x4 sterile gauze Post-procedure details:    Post-insertion x-ray findings: tube in good position     Procedure completion:  Tolerated well, no immediate complications .Critical Care  Performed by: Terrilee Files, MD Authorized by: Terrilee Files, MD   Critical care provider statement:    Critical care time (minutes):  30   Critical care was necessary to treat or prevent imminent or life-threatening deterioration of the following conditions:  Respiratory failure   Critical care was time spent personally by me on the following activities:  Development of treatment plan with patient or surrogate, discussions with consultants, evaluation of patient's response to treatment, examination of patient, ordering and review of laboratory studies, ordering and review of radiographic studies, ordering and performing treatments and interventions, pulse oximetry, re-evaluation of patient's condition and review of old charts     Medications Ordered in ED Medications  oxyCODONE-acetaminophen (PERCOCET/ROXICET) 5-325 MG per tablet 1-2 tablet (has no administration in time range)  enoxaparin (LOVENOX) injection 40 mg (has no administration in time range)  carisoprodol (SOMA) tablet 350 mg (350 mg Oral Not Given 12/11/23 1806)  lidocaine-prilocaine (EMLA) cream 1 Application (has no administration in time range)  ondansetron (ZOFRAN-ODT) disintegrating tablet 4 mg (has no  administration in time range)  oxyCODONE (Oxy IR/ROXICODONE) immediate release tablet 10 mg (10 mg Oral Given 12/11/23 1402)  tamsulosin (FLOMAX) capsule 0.4 mg (0.4 mg Oral Given 12/11/23 1722)  fluticasone furoate-vilanterol (BREO ELLIPTA) 100-25 MCG/ACT 1 puff (1 puff Inhalation Not Given 12/11/23 1230)    And  umeclidinium bromide (INCRUSE ELLIPTA) 62.5 MCG/ACT 1 puff (1 puff Inhalation Not Given 12/11/23 1230)  albuterol (PROVENTIL) (2.5 MG/3ML) 0.083% nebulizer solution 3 mL (has no administration in time range)  hydrALAZINE (APRESOLINE) injection 10 mg (has no administration in time range)  labetalol (NORMODYNE) injection 10 mg (has no administration in time range)  ketorolac (TORADOL) 30 MG/ML injection 30 mg (30 mg Intravenous Given 12/11/23 1722)  HYDROmorphone (DILAUDID) injection 1 mg (has no administration in time range)  sodium chloride flush (NS) 0.9 % injection 10 mL (10 mLs Intrapleural Given 12/11/23 1723)  morphine (PF) 4 MG/ML injection 4 mg (4 mg Intravenous Given 12/11/23 0846)  morphine (PF) 4 MG/ML injection 4 mg (4 mg Intravenous Given 12/11/23 1001)    ED Course/ Medical Decision Making/ A&P Clinical Course as of 12/11/23 1830  Fri Dec 11, 2023  0827 Patient has a large pneumothorax on right.  He is not in any distress but will set up for tube thoracostomy likely pigtail [MB]  [phone] Radiology called me to confirm patient's pneumothorax.  Have consented him for procedure. [MB]  [id number] Right-sided pigtail placed with moderate expansion of his pneumothorax.  Patient tolerated procedure well. [MB]  1057 Discussed with Dr. Katrinka Blazing pulmonary critical care.  He said that the hospitalist can admit and pulmonary will see in consult. [MB]  1159 Discussed with [facility] hospitalist Dezii who will evaluate patient for admission. [MB]    Clinical Course User Index [MB] Terrilee Files, MD                                 Medical Decision Making Amount and/or Complexity of Data  Reviewed Labs: ordered. Radiology: ordered.  Risk Prescription drug management. Decision regarding hospitalization.   This patient complains of right sided pleuritic chest pain, shortness of breath; this involves an extensive number of treatment Options and is a complaint that carries with it a high risk of complications and morbidity. The differential includes COPD, pneumonia, ACS, pneumothorax, PE, vascular  I ordered, reviewed and interpreted labs, which included CBC and chemistries unremarkable, troponins mildly elevated I ordered medication IV pain medication and reviewed PMP when indicated. I ordered imaging studies which included chest x-ray and I independently    visualized and interpreted imaging which showed large right pneumothorax Additional history obtained from patient's girlfriend Previous records obtained and reviewed in epic no recent admissions I consulted pulmonology Dr. Katrinka Blazing and [facility] hospitalist Dr. Rennis Chris and discussed lab and imaging findings and discussed disposition.  Cardiac monitoring reviewed, sinus rhythm Social determinants considered, tobacco use Critical Interventions: Identification of a placement of chest tube for patient's pneumothorax.  Management of his pain with IV medications  After the interventions stated above, I reevaluated the patient and found patient's breathing to be improved and moderate reexpansion of his lung Admission and further testing considered, patient will need transfer to [facility] for further management of chest tube.  Patient in agreement with plan for transfer.         Final Clinical Impression(s) / ED Diagnoses Final diagnoses:  Spontaneous pneumothorax    Rx / DC Orders ED Discharge Orders     None         Terrilee Files, MD 12/11/23 [phone]

## 2023-12-11 NOTE — H&P (Signed)
History and Physical    Patient: Wayne Rowe NWG:[id number] DOB: Jan 26, 1961 DOA: 12/11/2023 DOS: the patient was seen and examined on 12/11/2023 PCP: Elfredia Nevins, MD  Patient coming from: Home  Chief Complaint:  Chief Complaint  Patient presents with   Chest Pain   HPI: Wayne Rowe is a 63 y.o. male with medical history significant of diabetes, active tobacco use, polyosteoarthritis, chronic pain, anxiety.  He presents to the ED complaining of stabbing right-sided chest pain worse with deep inspiration.  Reports the pain began yesterday and was becoming progressively worse with worsening SOB so he presented.  Denies any inciting traumas.  He does endorse chronic cough. Denies fever, nausea, vomiting. Smokes a pack a day for the last 40 years. Uses daily inhalers but not home O2.   Chest x-ray in the ED revealed large right-sided pneumothorax.  Pigtail chest tube was placed.  Repeat chest x-ray shows some reexpansion, not yet 100%.  We were called to admit the patient. On evaluation pt is tachycardic and still endorsing some right sided chest pain. Labs are mostly unremarkable. Small troponin elevation.   Review of Systems: As mentioned in the history of present illness. All other systems reviewed and are negative. Past Medical History:  Diagnosis Date   Anxiety    Arthritis    Back pain    DDD (degenerative disc disease)    Past Surgical History:  Procedure Laterality Date   BACK SURGERY     Social History:  reports that he has been smoking cigarettes. He has never used smokeless tobacco. He reports that he does not currently use alcohol. He reports that he does not currently use drugs after having used the following drugs: Marijuana.  No Known Allergies  Family History  Problem Relation Age of Onset   Healthy Mother    Hypertension Father    Diabetes Father    Hyperlipidemia Father    Arthritis Sister    Heart attack Maternal Grandfather    Cancer Sister      Prior to Admission medications   Medication Sig Start Date End Date Taking? Authorizing Provider  aspirin EC 81 MG tablet Take 81-325 mg by mouth every 4 (four) hours as needed for moderate pain (pain score 4-6).   Yes [provider]  carisoprodol (SOMA) 350 MG tablet Take 350 mg by mouth 4 (four) times daily.   Yes [provider]  ibuprofen (ADVIL) 200 MG tablet Take 600 mg by mouth 4 (four) times daily as needed for moderate pain (pain score 4-6).   Yes [provider]  lidocaine-prilocaine (EMLA) cream Apply 1 Application topically 4 (four) times daily as needed (Pain). 10/29/23  Yes [provider]  ondansetron (ZOFRAN ODT) 4 MG disintegrating tablet Take 1 tablet (4 mg total) by mouth every 8 (eight) hours as needed for nausea or vomiting. 01/21/20  Yes Fawze, Mina A, PA-C  Oxycodone HCl 10 MG TABS Take 10 mg by mouth every 3 (three) hours. 12/15/18  Yes [provider]  tamsulosin (FLOMAX) 0.4 MG CAPS capsule Take 0.4 mg by mouth every evening. 04/12/15  Yes [provider]  TRELEGY ELLIPTA 100-62.5-25 MCG/INH AEPB Inhale 1 puff into the lungs daily. 09/16/19  Yes [provider]  VENTOLIN HFA 108 (90 Base) MCG/ACT inhaler Inhale 1-2 puffs into the lungs every 6 (six) hours as needed for wheezing or shortness of breath.  03/08/19  Yes [provider]    Physical Exam: Vitals:   12/11/23 0930  12/11/23 1000 12/11/23 1015 12/11/23 1045  BP: 103/63 100/74 105/75 110/76  Pulse: 96 (!) 105 90 98  Resp:      Temp:      TempSrc:      SpO2: 91% 95% 94% 96%  Weight:      Height:       Constitutional:  thin male, appears uncomfortable  HENT: Head Normocephalic and atraumatic.  Mucous membranes are moist.  Eyes:  Extraocular intact. Conjunctivae normal. Pupils are equal, round, and reactive to light.  Cardiovascular: tachycardic Pulmonary: Labored respirations, shallow respirations, decreased aeration right upper  lung Musculoskeletal:  Normal range of motion.  Skin: warm and dry. not jaundiced.  Neurological: No focal deficit present. alert. Oriented. Psychiatric: Mood and Affect congruent.   Data Reviewed:  Lab Results  Component Value Date   WBC 9.3 12/11/2023   HGB 15.6 12/11/2023   HCT 46.5 12/11/2023   MCV 91.0 12/11/2023   PLT 398 12/11/2023   basic metabolic panel    Latest Ref Rng & Units 12/11/2023    9:33 AM 06/11/2020    1:08 PM 01/21/2020   12:42 PM  BMP  Glucose 70 - 99 mg/dL 161  096  045   BUN 8 - 23 mg/dL 16  14  22    Creatinine 0.61 - 1.24 mg/dL 4.09  8.11  9.14   Sodium 135 - 145 mmol/L 137  135  135   Potassium 3.5 - 5.1 mmol/L 4.4  3.8  3.9   Chloride 98 - 111 mmol/L 100  103  99   CO2 22 - 32 mmol/L 27  22  23    Calcium 8.9 - 10.3 mg/dL 9.1  9.2  9.3    chest X-ray reviewed.    Assessment and Plan: Right-sided pneumothorax - Chest tube in place - Will need to be managed at [facility], have requested bed for admission - Continue on telemetry - Suspect etiology is Bullous emphysema, will need CT scan to confirm - Chest tube management per PCCM, Dr. Katrinka Blazing has been consulted and agrees to manage -- Cont supplemental O2, wean as tolerated. No O2 requirement at baseline  COPD - Continue with DuoNebs PRN - Home dose inhalers as scheduled  Current tobacco abuse -- 1-2 PPD x 40 years - Nicotine patch - Counseled on smoking cessation  Chronic pain syndrome Arthritis Degenerative disc disease  - Follows with pain clinic outpatient, will continue home meds   Advance Care Planning: Full code  Consults: PCCM, Dr. Katrinka Blazing at [facility]  Family Communication: Discussed directly with patient   Severity of Illness: The appropriate patient status for this patient is INPATIENT. Inpatient status is judged to be reasonable and necessary in order to provide the required intensity of service to ensure the patient's safety. The patient's presenting symptoms, physical exam  findings, and initial radiographic and laboratory data in the context of their chronic comorbidities is felt to place them at high risk for further clinical deterioration. Furthermore, it is not anticipated that the patient will be medically stable for discharge from the hospital within 2 midnights of admission.   * I certify that at the point of admission it is my clinical judgment that the patient will require inpatient hospital care spanning beyond 2 midnights from the point of admission due to high intensity of service, high risk for further deterioration and high frequency of surveillance required.*  Author: Debarah Crape, DO 12/11/2023 11:28 AM  For on call review [email or web address].

## 2023-12-11 NOTE — Consult Note (Signed)
NAME:  Wayne Rowe, MRN:  [id number], DOB:  Feb 09, 1961, LOS: 0 ADMISSION DATE:  12/11/2023, CONSULTATION DATE:  12/11/23 REFERRING MD:  Rennis Chris - [facility], CHIEF COMPLAINT:  chest pain   History of Present Illness:  63 yo M PMH COPD, tobacco use, chronic pain presented to [facility] ED 2/21 w CC chest pain, associated SOB. Started yesterday, progressively worsened. R sided. Worse w deep inspiration. No falls or trauma.  No hx of previous PTX.  Cxr in ED w large r ptx for which a chest tube was placed.  Pt was  admitted to [facility], transferred to [facility] at [facility] and PCCM consulted in this setting   Pertinent  Medical History  Tobacco use COPD Anxiety Chronic pain Arthritis  DDD   Significant Hospital Events: Including procedures, antibiotic start and stop dates in addition to other pertinent events   2/21 ED at [facility] for chest pain/ SOB. R ptx, pigtail placed. Txf to [facility]. [facility] admit, PCCM consult   Interim History / Subjective:  Arrives from [facility]  Objective   Blood pressure 108/74, pulse 94, temperature 98.3 F (36.8 C), temperature source Oral, resp. rate 17, height 6\' 1"  (1.854 m), weight 56.7 kg, SpO2 98%.       No intake or output data in the 24 hours ending 12/11/23 1652 Filed Weights   12/11/23 0759  Weight: 56.7 kg    Examination: Thin man NAD 1+ air leak atrium Moves to command Mild clubbing Aox3 Abd soft Heart sounds regular  CXR reviewed  Resolved Hospital Problem list     Assessment & Plan:   R ptx, spontaneous COPD  Tobacco use  Baseline opiate use  P -cont chest tube to sxn -routine chest tube care  -multimodal pain strategy -CT chest to figure out anatomy and see if VATS may help -AM CXR -will follow with you  Best Practice (right click and "Reselect all SmartList Selections" daily)   Per primary  Labs   CBC: Recent Labs  Lab 12/11/23 0933  WBC 9.3  NEUTROABS 7.1  HGB 15.6  HCT 46.5  MCV 91.0  PLT 398    Basic Metabolic Panel: Recent Labs  Lab  12/11/23 0933  NA 137  K 4.4  CL 100  CO2 27  GLUCOSE 111*  BUN 16  CREATININE 0.93  CALCIUM 9.1   GFR: Estimated Creatinine Clearance: 66 mL/min (by C-G formula based on SCr of 0.93 mg/dL). Recent Labs  Lab 12/11/23 0933  WBC 9.3    Liver Function Tests: No results for input(s): "AST", "ALT", "ALKPHOS", "BILITOT", "PROT", "ALBUMIN" in the last 168 hours. No results for input(s): "LIPASE", "AMYLASE" in the last 168 hours. No results for input(s): "AMMONIA" in the last 168 hours.  ABG    Component Value Date/Time   HCO3 23.2 03/14/2019 2049   ACIDBASEDEF 4.4 (H) 03/14/2019 2049   O2SAT 84.7 03/14/2019 2049     Coagulation Profile: No results for input(s): "INR", "PROTIME" in the last 168 hours.  Cardiac Enzymes: No results for input(s): "CKTOTAL", "CKMB", "CKMBINDEX", "TROPONINI" in the last 168 hours.  HbA1C: No results found for: "HGBA1C"  CBG: No results for input(s): "GLUCAP" in the last 168 hours.  Review of Systems:    Positive Symptoms in bold:  Constitutional fevers, chills, weight loss, fatigue, anorexia, malaise  Eyes decreased vision, double vision, eye irritation  Ears, Nose, Mouth, Throat sore throat, trouble swallowing, sinus congestion  Cardiovascular chest pain, paroxysmal nocturnal dyspnea, lower ext edema, palpitations   Respiratory  SOB, cough, DOE, hemoptysis, wheezing  Gastrointestinal nausea, vomiting, diarrhea  Genitourinary burning with urination, trouble urinating  Musculoskeletal joint aches, joint swelling, back pain  Integumentary  rashes, skin lesions  Neurological focal weakness, focal numbness, trouble speaking, headaches  Psychiatric depression, anxiety, confusion  Endocrine polyuria, polydipsia, cold intolerance, heat intolerance  Hematologic abnormal bruising, abnormal bleeding, unexplained nose bleeds  Allergic/Immunologic recurrent infections, hives, swollen lymph nodes     Past Medical History:  He,  has a past  medical history of Anxiety, Arthritis, Back pain, and DDD (degenerative disc disease).   Surgical History:   Past Surgical History:  Procedure Laterality Date   BACK SURGERY       Social History:   reports that he has been smoking cigarettes. He has never used smokeless tobacco. He reports that he does not currently use alcohol. He reports that he does not currently use drugs after having used the following drugs: Marijuana.   Family History:  His family history includes Arthritis in his sister; Cancer in his sister; Diabetes in his father; Healthy in his mother; Heart attack in his maternal grandfather; Hyperlipidemia in his father; Hypertension in his father.   Allergies No Known Allergies   Home Medications  Prior to Admission medications   Medication Sig Start Date End Date Taking? Authorizing Provider  aspirin EC 81 MG tablet Take 81-325 mg by mouth every 4 (four) hours as needed for moderate pain (pain score 4-6).   Yes [provider]  carisoprodol (SOMA) 350 MG tablet Take 350 mg by mouth 4 (four) times daily.   Yes [provider]  ibuprofen (ADVIL) 200 MG tablet Take 600 mg by mouth 4 (four) times daily as needed for moderate pain (pain score 4-6).   Yes [provider]  lidocaine-prilocaine (EMLA) cream Apply 1 Application topically 4 (four) times daily as needed (Pain). 10/29/23  Yes [provider]  ondansetron (ZOFRAN ODT) 4 MG disintegrating tablet Take 1 tablet (4 mg total) by mouth every 8 (eight) hours as needed for nausea or vomiting. 01/21/20  Yes Fawze, Mina A, PA-C  Oxycodone HCl 10 MG TABS Take 10 mg by mouth every 3 (three) hours. 12/15/18  Yes [provider]  tamsulosin (FLOMAX) 0.4 MG CAPS capsule Take 0.4 mg by mouth every evening. 04/12/15  Yes [provider]  TRELEGY ELLIPTA 100-62.5-25 MCG/INH AEPB Inhale 1 puff into the lungs daily. 09/16/19  Yes [provider]  VENTOLIN HFA 108 (90 Base) MCG/ACT  inhaler Inhale 1-2 puffs into the lungs every 6 (six) hours as needed for wheezing or shortness of breath.  03/08/19  Yes [provider]     Critical care time: N/A

## 2023-12-11 NOTE — ED Triage Notes (Signed)
Pt c/o right sides chest pain that began yesterday at 1PM.

## 2023-12-11 NOTE — ED Notes (Signed)
[facility] here for transport.  

## 2023-12-12 ENCOUNTER — Inpatient Hospital Stay (HOSPITAL_COMMUNITY): Payer: 59

## 2023-12-12 DIAGNOSIS — J9312 Secondary spontaneous pneumothorax: Secondary | ICD-10-CM | POA: Diagnosis not present

## 2023-12-12 DIAGNOSIS — J439 Emphysema, unspecified: Secondary | ICD-10-CM | POA: Diagnosis not present

## 2023-12-12 DIAGNOSIS — Z720 Tobacco use: Secondary | ICD-10-CM

## 2023-12-12 DIAGNOSIS — J9383 Other pneumothorax: Principal | ICD-10-CM

## 2023-12-12 DIAGNOSIS — J939 Pneumothorax, unspecified: Secondary | ICD-10-CM | POA: Diagnosis not present

## 2023-12-12 LAB — COMPREHENSIVE METABOLIC PANEL
ALT: 18 U/L (ref 0–44)
AST: 20 U/L (ref 15–41)
Albumin: 3.2 g/dL — ABNORMAL LOW (ref 3.5–5.0)
Alkaline Phosphatase: 55 U/L (ref 38–126)
Anion gap: 8 (ref 5–15)
BUN: 25 mg/dL — ABNORMAL HIGH (ref 8–23)
CO2: 27 mmol/L (ref 22–32)
Calcium: 8.7 mg/dL — ABNORMAL LOW (ref 8.9–10.3)
Chloride: 97 mmol/L — ABNORMAL LOW (ref 98–111)
Creatinine, Ser: 1.09 mg/dL (ref 0.61–1.24)
GFR, Estimated: >60 mL/min (ref >60)
Glucose, Bld: 97 mg/dL (ref 70–99)
Potassium: 4.2 mmol/L (ref 3.5–5.1)
Sodium: 132 mmol/L — ABNORMAL LOW (ref 135–145)
Total Bilirubin: 0.6 mg/dL (ref 0.0–1.2)
Total Protein: 6 g/dL — ABNORMAL LOW (ref 6.5–8.1)

## 2023-12-12 LAB — CBC
HCT: 39.5 % (ref 39.0–52.0)
Hemoglobin: 13.3 g/dL (ref 13.0–17.0)
MCH: 30.2 pg (ref 26.0–34.0)
MCHC: 33.7 g/dL (ref 30.0–36.0)
MCV: 89.8 fL (ref 80.0–100.0)
Platelets: 335 10*3/uL (ref 150–400)
RBC: 4.4 MIL/uL (ref 4.22–5.81)
RDW: 12.1 % (ref 11.5–15.5)
WBC: 11.6 10*3/uL — ABNORMAL HIGH (ref 4.0–10.5)
nRBC: 0 % (ref 0.0–0.2)

## 2023-12-12 MED ORDER — LIDOCAINE HCL 1 % IJ SOLN
10.0000 mL | Freq: Once | INTRAMUSCULAR | Status: AC
  Administered 2023-12-12: 10 mL via INTRADERMAL
  Filled 2023-12-12: qty 10

## 2023-12-12 MED ORDER — PANTOPRAZOLE SODIUM 40 MG PO TBEC
40.0000 mg | DELAYED_RELEASE_TABLET | Freq: Every day | ORAL | Status: DC
  Administered 2023-12-12 – 2023-12-20 (×8): 40 mg via ORAL
  Filled 2023-12-12 (×8): qty 1

## 2023-12-12 NOTE — Procedures (Signed)
 Insertion of Chest Tube Procedure Note  Wayne Rowe  [id number]  06-Apr-1961  Date:12/12/23  Time:11:11 PM   Provider Performing: Janann Colonel   Procedure: Chest Tube Insertion (32551)  Indication(s) Pneumothorax  Consent Risks of the procedure as well as the alternatives and risks of each were explained to the patient and/or caregiver.  Consent for the procedure was obtained and is signed in the bedside chart  Anesthesia Topical only with 1% lidocaine   Time Out Verified patient identification, verified procedure, site/side was marked, verified correct patient position, special equipment/implants available, medications/allergies/relevant history reviewed, required imaging and test results available.  Sterile Technique Maximal sterile technique including full sterile barrier drape, hand hygiene, sterile gown, sterile gloves, mask, hair covering, sterile ultrasound probe cover (if used).   Procedure Description Ultrasound not used to identify appropriate pleural anatomy for placement and overlying skin marked. Area of placement cleaned and draped in sterile fashion.  A 14 French pigtail pleural catheter was placed into the right pleural space using Seldinger technique. Appropriate return of air was obtained.  The tube was connected to atrium and placed on -20 cm H2O wall suction.  Complications/Tolerance None; patient tolerated the procedure well. Chest X-ray is ordered to verify placement.  EBL Minimal  Specimen(s) none   Janann Colonel, MD [facility] 12/12/2023 11:12 PM

## 2023-12-12 NOTE — Progress Notes (Signed)
 Called to be notified about CXR done earlier today with recurrent pneumothorax, on encounter patient was hemodynamically stable but had extensive crepitus. Repeat CXR with worsening pneumothorax and extensive subcutaneous emphysema. We will proceed with chest tube placement, CT scan of the chest earlier with bullous emphysema. Consider consulting thoracic surgery in the AM for VATS vs chemical pleurodesis.   Janann Colonel, MD [facility] 12/12/2023 11:14 PM

## 2023-12-12 NOTE — Progress Notes (Addendum)
 PROGRESS NOTE  MARCELLIUS Rowe WNU:[id number] DOB: 05/09/1961 DOA: 12/11/2023 PCP: Elfredia Nevins, MD   LOS: 1 day   Brief narrative:   Wayne Rowe is a 63 y.o. male with medical history significant of diabetes, active tobacco use, COPD polyosteoarthritis, chronic pain, anxiety presented to hospital with right-sided stabbing chest pain deep with inspiration with worsening shortness of breath.  Patient is current smoker.  In the ED patient was noted to have large right-sided pneumothorax and Pigtail chest tube was placed.  Labs were unremarkable but with slight troponin elevation.  Patient was then considered for admission to the hospital for further evaluation and treatment   Assessment/Plan: Principal Problem:   Pneumothorax  Right-sided pneumothorax Possibly secondary to bullous emphysema.  CT scan of the chest done showed persistent anterior right pneumothorax around 10 to 15% without any tension.  Scattered groundglass opacities in the right lung.  Bullous emphysema at the apices.  Chest tube management as per PCCM.  Patient still complains of pain while taking a deep breath.  But no dyspnea.  Continue analgesia   COPD Continue DuoNebs, no overt wheezing.   Current tobacco abuse -- 1-2 PPD x 40 years.  Counseling reinforced.  Continue nicotine patch.  Chronic pain syndrome Arthritis Degenerative disc disease  - Follows with pain clinic outpatient, continue Percocet and Toradol.  Add Protonix.  Mild hyponatremia.  Will continue to monitor.  Mild leukocytosis.  Could be reactive.  Will monitor.    DVT prophylaxis: enoxaparin (LOVENOX) injection 40 mg Start: 12/11/23 2200   Disposition: Home likely in 1 to 2 days  Status is: Inpatient Remains inpatient appropriate because: Pneumothorax status post chest tube placement, pending clinical improvement    Code Status:     Code Status: Full Code  Family Communication: None  Consultants: PCCM  Procedures: Right sided  chest tube placement  Anti-infectives:  None  Anti-infectives (From admission, onward)    None        Subjective: Today, patient was seen and examined at bedside.  Patient states that he has some chest discomfort on taking a deep breath.  No overt dyspnea.  No vomiting fever chills but no nausea.  Spoke about quitting smoking..  Objective: Vitals:   12/12/23 0555 12/12/23 0738  BP: 95/62 (!) 97/57  Pulse: 73 80  Resp: 19 17  Temp: 98.3 F (36.8 C) 97.8 F (36.6 C)  SpO2: 98% 97%    Intake/Output Summary (Last 24 hours) at 12/12/2023 0939 Last data filed at 12/12/2023 0600 Gross per 24 hour  Intake 1560 ml  Output 700 ml  Net 860 ml   Filed Weights   12/11/23 0759  Weight: 56.7 kg   Body mass index is 16.49 kg/m.   Physical Exam:  GENERAL: Patient is alert awake and oriented. Not in obvious distress.  Thinly built. HENT: No scleral pallor or icterus. Pupils equally reactive to light. Oral mucosa is moist NECK: is supple, no gross swelling noted. CHEST: Diminished breath sounds mostly on the right side with chest tube in place.   CVS: S1 and S2 heard, no murmur. Regular rate and rhythm.  ABDOMEN: Soft, non-tender, bowel sounds are present. EXTREMITIES: No edema.  Thin extremities CNS: Cranial nerves are intact. No focal motor deficits. SKIN: warm and dry without rashes.  Data Review: I have personally reviewed the following laboratory data and studies,  CBC: Recent Labs  Lab 12/11/23 0933 12/12/23 0625  WBC 9.3 11.6*  NEUTROABS 7.1  --   HGB  15.6 13.3  HCT 46.5 39.5  MCV 91.0 89.8  PLT 398 335   Basic Metabolic Panel: Recent Labs  Lab 12/11/23 0933 12/12/23 0625  NA 137 132*  K 4.4 4.2  CL 100 97*  CO2 27 27  GLUCOSE 111* 97  BUN 16 25*  CREATININE 0.93 1.09  CALCIUM 9.1 8.7*   Liver Function Tests: Recent Labs  Lab 12/12/23 0625  AST 20  ALT 18  ALKPHOS 55  BILITOT 0.6  PROT 6.0*  ALBUMIN 3.2*   No results for input(s):  "LIPASE", "AMYLASE" in the last 168 hours. No results for input(s): "AMMONIA" in the last 168 hours. Cardiac Enzymes: No results for input(s): "CKTOTAL", "CKMB", "CKMBINDEX", "TROPONINI" in the last 168 hours. BNP (last 3 results) No results for input(s): "BNP" in the last 8760 hours.  ProBNP (last 3 results) No results for input(s): "PROBNP" in the last 8760 hours.  CBG: No results for input(s): "GLUCAP" in the last 168 hours. No results found for this or any previous visit (from the past 240 hours).   Studies: CT CHEST WO CONTRAST Result Date: 12/11/2023 CLINICAL DATA:  Spontaneous pneumothorax, right chest tube EXAM: CT CHEST WITHOUT CONTRAST TECHNIQUE: Multidetector CT imaging of the chest was performed following the standard protocol without IV contrast. RADIATION DOSE REDUCTION: This exam was performed according to the departmental dose-optimization program which includes automated exposure control, adjustment of the mA and/or kV according to patient size and/or use of iterative reconstruction technique. COMPARISON:  12/11/2023, 03/14/2019 FINDINGS: Cardiovascular: Unenhanced imaging of the heart is unremarkable without pericardial effusion. Normal caliber of the thoracic aorta. Atherosclerosis of the aorta and coronary vasculature. Mediastinum/Nodes: No enlarged mediastinal or axillary lymph nodes. Thyroid gland, trachea, and esophagus demonstrate no significant findings. Lungs/Pleura: Stable position of the right-sided pigtail drainage catheter, coiled within the right anterior hemithorax. There are at least 2 side holes that project outside of the pleural space, increasing likelihood of air leak. Subcutaneous gas is seen within the right chest wall along the course of the catheter. There is a persistent anterior right pneumothorax, volume estimated 10-15%. No mediastinal shift or tension effect. There are areas of dense consolidation within the dependent right lower lobe and right middle  lobe, favoring atelectasis. Areas of ground-glass airspace disease elsewhere throughout the right lung, greatest in the mid and lower lung zones, could reflect residual hypoventilatory change or edema. The left chest is clear. Bullous emphysematous changes are seen bilaterally, greatest at the apices. Central airways are patent. Upper Abdomen: No acute abnormality. Musculoskeletal: No acute or destructive bony abnormalities. Reconstructed images demonstrate no additional findings. IMPRESSION: 1. Persistent anterior right pneumothorax, volume estimated 10-15%. No tension effect. 2. Right-sided pigtail pleural drainage catheter as above, with several proximal side port projecting outside the pleural space. This increases possibility of air leak. There is some subcutaneous gas along the course of the catheter, which could be due to air leak or related to catheter placement. 3. Scattered ground-glass opacities throughout the right lung, which may reflect re-expansion edema or residual hypoventilatory change. Denser areas of atelectasis are seen within the right middle and right lower lobes. 4. Bullous emphysema, greatest at the apices. 5.  Aortic Atherosclerosis (ICD10-I70.0). Electronically Signed   By: Sharlet Salina M.D.   On: 12/11/2023 21:16   DG Chest Portable 1 View Result Date: 12/11/2023 CLINICAL DATA:  Right chest tube placement for pneumothorax. EXAM: PORTABLE CHEST 1 VIEW COMPARISON:  12/11/2023 at 8:26 a.m. FINDINGS: New right pigtail chest tube projects  along the lateral mid right hemithorax. Right lung has been mostly re-expanded. There is a pleural line that persists along right upper lung consistent with a small residual pneumothorax. Right lung base opacity is noted consistent with atelectasis. Left lung is hyperexpanded, but clear. There is no residual midline shift to the left. IMPRESSION: 1. Significant re-expansion of the right lung following right-sided chest tube placement. Small residual  pneumothorax. No residual midline shift. Electronically Signed   By: Amie Portland M.D.   On: 12/11/2023 10:07   DG Chest Port 1 View Result Date: 12/11/2023 CLINICAL DATA:  Right-sided chest pain beginning yesterday afternoon. EXAM: PORTABLE CHEST 1 VIEW COMPARISON:  05/14/2022. FINDINGS: Large right pneumothorax. Mild mediastinal shift to the left suggest a component of tension. Prominent interstitial markings throughout the left lung. No lung consolidation. Cardiac silhouette normal in size.  No mediastinal or hilar masses. Skeletal structures grossly intact. IMPRESSION: 1. Large right sided pneumothorax with evidence of tension. Critical Value/emergent results were called by telephone at the time of interpretation on 12/11/2023 at 8:46 am to provider [facility] , who verbally acknowledged these results. Electronically Signed   By: Amie Portland M.D.   On: 12/11/2023 08:46      Joycelyn Das, MD  [facility] 12/12/2023  If 7PM-7AM, please contact night-coverage

## 2023-12-12 NOTE — Progress Notes (Addendum)
 Right chest tube removed per order. Dressing CDI, though subcutaneous air present (present prior to removal). CXR orders already placed by provider.

## 2023-12-12 NOTE — Progress Notes (Signed)
 Called by radiology CXR with recurrent pneumothorax.  Critical care updated.  Repeat chest x-ray ordered by critical care. Placed on nonrebreather at 100%

## 2023-12-12 NOTE — Consult Note (Signed)
 NAME:  HAZEL WRINKLE, MRN:  [id number], DOB:  1961-08-05, LOS: 1 ADMISSION DATE:  12/11/2023, CONSULTATION DATE:  12/11/23 REFERRING MD:  Rennis Chris - TR[facility], CHIEF COMPLAINT:  chest pain   History of Present Illness:  63 yo M PMH COPD, tobacco use, chronic pain presented to [facility] ED 2/21 w CC chest pain, associated SOB. Started yesterday, progressively worsened. R sided. Worse w deep inspiration. No falls or trauma.  No hx of previous PTX.  Cxr in ED w large r ptx for which a chest tube was placed.  Pt was  admitted to [facility], transferred to [facility] at [facility] and PCCM consulted in this setting   Pertinent  Medical History  Tobacco use COPD Anxiety Chronic pain Arthritis  DDD   Significant Hospital Events: Including procedures, antibiotic start and stop dates in addition to other pertinent events   2/21 ED at [facility] for chest pain/ SOB. R ptx, pigtail placed. Txf to [facility]. [facility] admit, PCCM consult   Interim History / Subjective:  Arrives from [facility]  Objective   Blood pressure (!) 97/57, pulse 80, temperature 97.8 F (36.6 C), temperature source Oral, resp. rate 17, height 6\' 1"  (1.854 m), weight 56.7 kg, SpO2 97%.        Intake/Output Summary (Last 24 hours) at 12/12/2023 1213 Last data filed at 12/12/2023 0900 Gross per 24 hour  Intake 1800 ml  Output 950 ml  Net 850 ml   Filed Weights   12/11/23 0759  Weight: 56.7 kg    Examination: Thin chronically ill adult M NAD NCAT R chest wall subcutaneous emphysema, R chest tube in place without air leak  RR cap refill brisk Thin soft abd  No acute joint deformity   Resolved Hospital Problem list     Assessment & Plan:   R ptx, spontaneous COPD; bullous emphysema Tobacco use Baseline opiate use  -CT 2/21 w some improvement in ptx, now 10-15% or so, no tension features. Subcutaneous emphysema. Cxr 2/22 with decr size of small R apical ptx, but looks like pigtail has also withdrawn further, + incr [place] emphysema  P -at this point dont think the  chest tube is really functional -d/w my attending, will order to dc -repeat CXT 1500 2/22, and again in AM -pending afternoon CXR today, might have to replace pigtail. Best case scenario lung stays up today & tomorrow, and could possibly dc tomorrow  -PRN analgesia   Best Practice (right click and "Reselect all SmartList Selections" daily)   Per primary  Labs   CBC: Recent Labs  Lab 12/11/23 0933 12/12/23 0625  WBC 9.3 11.6*  NEUTROABS 7.1  --   HGB 15.6 13.3  HCT 46.5 39.5  MCV 91.0 89.8  PLT 398 335    Basic Metabolic Panel: Recent Labs  Lab 12/11/23 0933 12/12/23 0625  NA 137 132*  K 4.4 4.2  CL 100 97*  CO2 27 27  GLUCOSE 111* 97  BUN 16 25*  CREATININE 0.93 1.09  CALCIUM 9.1 8.7*   GFR: Estimated Creatinine Clearance: 56.4 mL/min (by C-G formula based on SCr of 1.09 mg/dL). Recent Labs  Lab 12/11/23 0933 12/12/23 0625  WBC 9.3 11.6*    Liver Function Tests: Recent Labs  Lab 12/12/23 0625  AST 20  ALT 18  ALKPHOS 55  BILITOT 0.6  PROT 6.0*  ALBUMIN 3.2*   No results for input(s): "LIPASE", "AMYLASE" in the last 168 hours. No results for input(s): "AMMONIA" in the last 168 hours.  ABG  Component Value Date/Time   HCO3 23.2 03/14/2019 2049   ACIDBASEDEF 4.4 (H) 03/14/2019 2049   O2SAT 84.7 03/14/2019 2049     Coagulation Profile: No results for input(s): "INR", "PROTIME" in the last 168 hours.  Cardiac Enzymes: No results for input(s): "CKTOTAL", "CKMB", "CKMBINDEX", "TROPONINI" in the last 168 hours.  HbA1C: No results found for: "HGBA1C"  CBG: No results for input(s): "GLUCAP" in the last 168 hours.  CCT na    Tessie Fass MSN, AGACNP-BC [facility] Pulmonary/Critical Care Medicine Amion for pager 12/12/2023, 12:13 PM

## 2023-12-13 ENCOUNTER — Inpatient Hospital Stay (HOSPITAL_COMMUNITY): Payer: 59

## 2023-12-13 DIAGNOSIS — J9312 Secondary spontaneous pneumothorax: Secondary | ICD-10-CM | POA: Diagnosis not present

## 2023-12-13 DIAGNOSIS — Z720 Tobacco use: Secondary | ICD-10-CM | POA: Diagnosis not present

## 2023-12-13 DIAGNOSIS — J9383 Other pneumothorax: Secondary | ICD-10-CM | POA: Diagnosis not present

## 2023-12-13 DIAGNOSIS — J439 Emphysema, unspecified: Secondary | ICD-10-CM | POA: Diagnosis not present

## 2023-12-13 LAB — CBC
HCT: 35.8 % — ABNORMAL LOW (ref 39.0–52.0)
Hemoglobin: 12.4 g/dL — ABNORMAL LOW (ref 13.0–17.0)
MCH: 30.6 pg (ref 26.0–34.0)
MCHC: 34.6 g/dL (ref 30.0–36.0)
MCV: 88.4 fL (ref 80.0–100.0)
Platelets: 293 10*3/uL (ref 150–400)
RBC: 4.05 MIL/uL — ABNORMAL LOW (ref 4.22–5.81)
RDW: 11.9 % (ref 11.5–15.5)
WBC: 9.8 10*3/uL (ref 4.0–10.5)
nRBC: 0 % (ref 0.0–0.2)

## 2023-12-13 LAB — BASIC METABOLIC PANEL
Anion gap: 9 (ref 5–15)
BUN: 23 mg/dL (ref 8–23)
CO2: 24 mmol/L (ref 22–32)
Calcium: 8.5 mg/dL — ABNORMAL LOW (ref 8.9–10.3)
Chloride: 92 mmol/L — ABNORMAL LOW (ref 98–111)
Creatinine, Ser: 1.24 mg/dL (ref 0.61–1.24)
GFR, Estimated: >60 mL/min (ref >60)
Glucose, Bld: 109 mg/dL — ABNORMAL HIGH (ref 70–99)
Potassium: 4.5 mmol/L (ref 3.5–5.1)
Sodium: 125 mmol/L — ABNORMAL LOW (ref 135–145)

## 2023-12-13 LAB — MAGNESIUM: Magnesium: 1.9 mg/dL (ref 1.7–2.4)

## 2023-12-13 MED ORDER — CALCIUM CARBONATE ANTACID 500 MG PO CHEW
1.0000 | CHEWABLE_TABLET | Freq: Two times a day (BID) | ORAL | Status: DC | PRN
  Administered 2023-12-13 – 2023-12-15 (×3): 200 mg via ORAL
  Filled 2023-12-13 (×3): qty 1

## 2023-12-13 MED ORDER — HYDROMORPHONE HCL 1 MG/ML IJ SOLN
2.0000 mg | Freq: Once | INTRAMUSCULAR | Status: AC
  Administered 2023-12-13: 2 mg via INTRAVENOUS
  Filled 2023-12-13: qty 2

## 2023-12-13 MED ORDER — HYDROMORPHONE HCL 1 MG/ML IJ SOLN
1.0000 mg | INTRAMUSCULAR | Status: DC | PRN
  Administered 2023-12-13: 1 mg via INTRAVENOUS
  Filled 2023-12-13: qty 1

## 2023-12-13 MED ORDER — POLYETHYLENE GLYCOL 3350 17 G PO PACK
17.0000 g | PACK | Freq: Every day | ORAL | Status: DC
  Administered 2023-12-13 – 2023-12-20 (×7): 17 g via ORAL
  Filled 2023-12-13 (×7): qty 1

## 2023-12-13 MED ORDER — PROCHLORPERAZINE EDISYLATE 10 MG/2ML IJ SOLN
5.0000 mg | Freq: Once | INTRAMUSCULAR | Status: AC | PRN
  Administered 2023-12-13: 5 mg via INTRAVENOUS
  Filled 2023-12-13: qty 2

## 2023-12-13 MED ORDER — DOCUSATE SODIUM 100 MG PO CAPS
100.0000 mg | ORAL_CAPSULE | Freq: Two times a day (BID) | ORAL | Status: DC
  Administered 2023-12-13 – 2023-12-20 (×13): 100 mg via ORAL
  Filled 2023-12-13 (×13): qty 1

## 2023-12-13 MED ORDER — HYDROMORPHONE HCL 1 MG/ML IJ SOLN
2.0000 mg | INTRAMUSCULAR | Status: DC | PRN
Start: 2023-12-13 — End: 2023-12-14
  Administered 2023-12-14 (×3): 2 mg via INTRAVENOUS
  Filled 2023-12-13 (×3): qty 2

## 2023-12-13 NOTE — Progress Notes (Signed)
 NAME:  Wayne Rowe, MRN:  [id number], DOB:  07/23/1961, LOS: 2 ADMISSION DATE:  12/11/2023, CONSULTATION DATE:  12/11/23 REFERRING MD:  Rennis Chris - TRH, CHIEF COMPLAINT:  chest pain   History of Present Illness:  63 yo M PMH COPD, tobacco use, chronic pain presented to [facility] ED 2/21 w CC chest pain, associated SOB. Started yesterday, progressively worsened. R sided. Worse w deep inspiration. No falls or trauma.  No hx of previous PTX.  Cxr in ED w large r ptx for which a chest tube was placed.  Pt was  admitted to [facility], transferred to [facility] at [facility] and PCCM consulted in this setting   Pertinent  Medical History  Tobacco use COPD Anxiety Chronic pain Arthritis  DDD   Significant Hospital Events: Including procedures, antibiotic start and stop dates in addition to other pertinent events   2/21 ED at [facility] for chest pain/ SOB. R ptx, pigtail placed. Txf to [facility]. [facility] admit, PCCM consult  2/22 pigtail non functional, cxr with overall improvement. Dc chest tube 2/23 had to have chest tube replaced    Interim History / Subjective:  Chest tube replaced   Incr pain today   Objective   Blood pressure (!) 88/51, pulse 73, temperature 98 F (36.7 C), temperature source Oral, resp. rate 18, height 6\' 1"  (1.854 m), weight 56.7 kg, SpO2 98%.        Intake/Output Summary (Last 24 hours) at 12/13/2023 9528 Last data filed at 12/13/2023 0900 Gross per 24 hour  Intake 760 ml  Output 900 ml  Net -140 ml   Filed Weights   12/11/23 0759  Weight: 56.7 kg    Examination:  General: Thin chronically ill M NAD Neuro: AAOx3  HEENT: NCAt poor dentition Lungs: R chest tube  + air leak  CV: cap refill brisk GI: thin abd GU: defer   Resolved Hospital Problem list     Assessment & Plan:   Spontaneous r ptx COPD; bullous emphysema Tobacco use Opiate use  -chest tube dc 2/22 as was non-functional. Had to be replaced  P -cont chest tube to sxn -AM CXR -probable CVTS consult Monday  -incr fq of  his PRN dilaudid, cont toradol, SCH and PRN oxy, add APAP -- with his baseline opiate use, he might end up being a guy who would do well with a PCA   Best Practice (right click and "Reselect all SmartList Selections" daily)   Per primary  Labs   CBC: Recent Labs  Lab 12/11/23 0933 12/12/23 0625 12/13/23 0724  WBC 9.3 11.6* 9.8  NEUTROABS 7.1  --   --   HGB 15.6 13.3 12.4*  HCT 46.5 39.5 35.8*  MCV 91.0 89.8 88.4  PLT 398 335 293    Basic Metabolic Panel: Recent Labs  Lab 12/11/23 0933 12/12/23 0625 12/13/23 0724  NA 137 132* 125*  K 4.4 4.2 4.5  CL 100 97* 92*  CO2 27 27 24   GLUCOSE 111* 97 109*  BUN 16 25* 23  CREATININE 0.93 1.09 1.24  CALCIUM 9.1 8.7* 8.5*  MG  --   --  1.9   GFR: Estimated Creatinine Clearance: 49.5 mL/min (by C-G formula based on SCr of 1.24 mg/dL). Recent Labs  Lab 12/11/23 0933 12/12/23 0625 12/13/23 0724  WBC 9.3 11.6* 9.8    Liver Function Tests: Recent Labs  Lab 12/12/23 0625  AST 20  ALT 18  ALKPHOS 55  BILITOT 0.6  PROT 6.0*  ALBUMIN 3.2*  No results for input(s): "LIPASE", "AMYLASE" in the last 168 hours. No results for input(s): "AMMONIA" in the last 168 hours.  ABG    Component Value Date/Time   HCO3 23.2 03/14/2019 2049   ACIDBASEDEF 4.4 (H) 03/14/2019 2049   O2SAT 84.7 03/14/2019 2049     Coagulation Profile: No results for input(s): "INR", "PROTIME" in the last 168 hours.  Cardiac Enzymes: No results for input(s): "CKTOTAL", "CKMB", "CKMBINDEX", "TROPONINI" in the last 168 hours.  HbA1C: No results found for: "HGBA1C"  CBG: No results for input(s): "GLUCAP" in the last 168 hours.   CCT na   Tessie Fass MSN, AGACNP-BC [facility] Pulmonary/Critical Care Medicine Amion for pager 12/13/2023, 9:53 AM

## 2023-12-13 NOTE — Plan of Care (Signed)

## 2023-12-13 NOTE — Plan of Care (Signed)
 Ongoing pain. Had a test dose of 2mg  dilaudid which sounds like wasn't helpful. D/w Dr. Katrinka Blazing -- rec a range 2-4mg  PRN q4. If he gets drowsy add EtCO2  Might end up needing a PCA.   Tessie Fass MSN, AGACNP-BC [facility] Pulmonary/Critical Care Medicine 12/13/2023, 4:05 PM

## 2023-12-13 NOTE — Progress Notes (Signed)
 PROGRESS NOTE  Wayne Rowe VWU:[id number] DOB: [birth year] DOA: 12/11/2023 PCP: Elfredia Nevins, MD   LOS: 2 days   Brief narrative:   Wayne Rowe is a 63 y.o. male with medical history significant of diabetes, active tobacco use, COPD polyosteoarthritis, chronic pain, anxiety presented to hospital with right-sided stabbing chest pain deep with inspiration with worsening shortness of breath.  Patient is current smoker.  In the ED patient was noted to have large right-sided pneumothorax and Pigtail chest tube was placed.  Labs were unremarkable but with slight troponin elevation.  Patient was then considered for admission to the hospital for further evaluation and treatment  Assessment/Plan: Principal Problem:   Pneumothorax Active Problems:   Spontaneous pneumothorax  Right-sided pneumothorax, recurrent pneumothorax Severe chest pain. Possibly secondary to bullous emphysema.  CT scan of the chest done showed persistent anterior right pneumothorax around 10 to 15% without any tension.  Scattered groundglass opacities in the right lung.  Bullous emphysema at the apices.  Chest tube was placed in and was managed by PCCM but subsequently came out.  On the evening 12/12/2023 patient had increased pneumothorax so needed chest tube placement again and was briefly on nonrebreather mask. Patient still complains of pain while taking a deep breath,but no dyspnea.  Continue analgesia, supplemental oxygen, follow PCCM recommendations.  Pulmonary considering CT surgery   COPD Continue DuoNebs, no overt wheezing.  Pain management.   Current tobacco abuse -- 1-2 PPD x 40 years.  Continue nicotine patch.  Chronic pain syndrome Arthritis Degenerative disc disease  - Follows with pain clinic outpatient, continue Percocet and Toradol, Protonix.  Has been added on muscle relaxant as well.  Dilaudid IV 1 mg every 3 hours has been initiated as well including Percocet for pain.  Mild hyponatremia.  Will  continue to monitor.  Sodium today at 125.  Could be secondary to pain.  Monitor BMP in AM.  Mild leukocytosis.  Likely reactive.  WBC today at 9.8.    DVT prophylaxis: enoxaparin (LOVENOX) injection 40 mg Start: 12/11/23 2200   Disposition: Home likely in 2 to 3 days.  Status is: Inpatient Remains inpatient appropriate because: Pneumothorax status post chest tube placement, pending clinical improvement, severe pain.    Code Status:     Code Status: Full Code  Family Communication: None  Consultants: PCCM  Procedures: Right sided chest tube placement x2  Anti-infectives:  None  Anti-infectives (From admission, onward)    None       Subjective: Today, patient was seen and examined at bedside.  Complains of sharp pain on the chest but no overt dyspnea.  No nausea vomiting fever chills or rigor.    Objective: Vitals:   12/13/23 0501 12/13/23 0757  BP: 95/67 (!) 88/51  Pulse: 83 73  Resp: 18 18  Temp: 97.6 F (36.4 C) 98 F (36.7 C)  SpO2: 99% 98%    Intake/Output Summary (Last 24 hours) at 12/13/2023 1040 Last data filed at 12/13/2023 0900 Gross per 24 hour  Intake 760 ml  Output 900 ml  Net -140 ml   Filed Weights   12/11/23 0759  Weight: 56.7 kg   Body mass index is 16.49 kg/m.   Physical Exam:  GENERAL: Patient is alert awake and oriented. Not in obvious distress.  Thinly built.  On nasal cannula oxygen appears chronically ill. HENT: No scleral pallor or icterus. Pupils equally reactive to light. Oral mucosa is moist NECK: is supple, no gross swelling noted. CHEST: Diminished breath  sounds mostly on the right side with chest tube in place.   CVS: S1 and S2 heard, no murmur. Regular rate and rhythm.  ABDOMEN: Soft, non-tender, bowel sounds are present. EXTREMITIES: No edema.  Thin extremities CNS: Cranial nerves are intact. No focal motor deficits. SKIN: warm and dry without rashes.  Data Review: I have personally reviewed the following  laboratory data and studies,  CBC: Recent Labs  Lab 12/11/23 0933 12/12/23 0625 12/13/23 0724  WBC 9.3 11.6* 9.8  NEUTROABS 7.1  --   --   HGB 15.6 13.3 12.4*  HCT 46.5 39.5 35.8*  MCV 91.0 89.8 88.4  PLT 398 335 293   Basic Metabolic Panel: Recent Labs  Lab 12/11/23 0933 12/12/23 0625 12/13/23 0724  NA 137 132* 125*  K 4.4 4.2 4.5  CL 100 97* 92*  CO2 27 27 24   GLUCOSE 111* 97 109*  BUN 16 25* 23  CREATININE 0.93 1.09 1.24  CALCIUM 9.1 8.7* 8.5*  MG  --   --  1.9   Liver Function Tests: Recent Labs  Lab 12/12/23 0625  AST 20  ALT 18  ALKPHOS 55  BILITOT 0.6  PROT 6.0*  ALBUMIN 3.2*   No results for input(s): "LIPASE", "AMYLASE" in the last 168 hours. No results for input(s): "AMMONIA" in the last 168 hours. Cardiac Enzymes: No results for input(s): "CKTOTAL", "CKMB", "CKMBINDEX", "TROPONINI" in the last 168 hours. BNP (last 3 results) No results for input(s): "BNP" in the last 8760 hours.  ProBNP (last 3 results) No results for input(s): "PROBNP" in the last 8760 hours.  CBG: No results for input(s): "GLUCAP" in the last 168 hours. No results found for this or any previous visit (from the past 240 hours).   Studies: DG CHEST PORT 1 VIEW Result Date: 12/13/2023 CLINICAL DATA:  Pneumothorax. EXAM: PORTABLE CHEST 1 VIEW COMPARISON:  December 12, 2023. FINDINGS: The heart size and mediastinal contours are within normal limits. Right-sided chest tube is again noted. Small right apical pneumothorax is noted. Extensive subcutaneous emphysema is seen over right lateral chest wall. Left lung is clear. The visualized skeletal structures are unremarkable. IMPRESSION: Right-sided chest tube is again noted. Small right apical pneumothorax is again noted with extensive right-sided subcutaneous emphysema. Electronically Signed   By: Lupita Raider M.D.   On: 12/13/2023 08:08   DG Chest 1 View Result Date: 12/12/2023 CLINICAL DATA:  Chest tube EXAM: CHEST  1 VIEW  COMPARISON:  12/12/2023 FINDINGS: Interval placement of right chest tube with re-expansion of the right lung. No visible pneumothorax. Subcutaneous emphysema throughout the right chest wall and bilateral neck soft tissues. No confluent airspace opacities or effusions. Heart mediastinal contours within normal limits. IMPRESSION: Interval placement of right chest tube with re-expansion of the right lung. No visible pneumothorax. Right chest wall and neck subcutaneous emphysema. Electronically Signed   By: Charlett Nose M.D.   On: 12/12/2023 23:20   DG CHEST PORT 1 VIEW Result Date: 12/12/2023 CLINICAL DATA:  Pneumothorax follow-up EXAM: PORTABLE CHEST 1 VIEW COMPARISON:  Chest x-ray 12/12/2023. FINDINGS: Moderate-to-large right pneumothorax has increased in size. There is no mediastinal shift. There is atelectasis in the right lower lung. Right chest wall emphysema is similar to the prior study. Left lung is clear. Cardiomediastinal silhouette is within normal limits. Osseous structures are stable. IMPRESSION: Moderate-to-large right pneumothorax has increased in size. No mediastinal shift. Electronically Signed   By: Darliss Cheney M.D.   On: 12/12/2023 21:28   DG CHEST  PORT 1 VIEW Result Date: 12/12/2023 CLINICAL DATA:  Pneumothorax, chest tube removal EXAM: PORTABLE CHEST 1 VIEW COMPARISON:  12/12/2023 at 5:57 a.m. FINDINGS: Two frontal views of the chest demonstrate interval removal of the right chest tube, with a moderate recurrent right pneumothorax volume estimated at least 25%. There is no tension effect or mediastinal shift. Patchy areas of consolidation within the right perihilar region likely reflect atelectasis. Left chest is clear. Extensive subcutaneous gas throughout the right chest wall. IMPRESSION: 1. Recurrent right pneumothorax after right chest tube removal, volume estimated at least 25%. No mediastinal shift or tension effect. 2. Right perihilar consolidation favoring atelectasis. 3. Extensive  subcutaneous gas throughout the right chest wall. Critical Value/emergent results were called by telephone at the time of interpretation on 12/12/2023 at 7:35 pm to provider DR CROSLEY, who verbally acknowledged these results. Electronically Signed   By: Sharlet Salina M.D.   On: 12/12/2023 19:36   DG Chest Port 1 View Result Date: 12/12/2023 CLINICAL DATA:  Follow-up right pneumothorax. EXAM: PORTABLE CHEST 1 VIEW COMPARISON:  12/11/2023 FINDINGS: Right pleural pigtail catheter remains in place. Small right apical pneumothorax has decreased in size since previous study. Increased subcutaneous emphysema seen in the right chest wall. Decreased atelectasis seen at right lung base. Left lung is clear. Heart size is normal. IMPRESSION: Decreased size of small right apical pneumothorax. Decreased right basilar atelectasis. Electronically Signed   By: Danae Orleans M.D.   On: 12/12/2023 09:55   CT CHEST WO CONTRAST Result Date: 12/11/2023 CLINICAL DATA:  Spontaneous pneumothorax, right chest tube EXAM: CT CHEST WITHOUT CONTRAST TECHNIQUE: Multidetector CT imaging of the chest was performed following the standard protocol without IV contrast. RADIATION DOSE REDUCTION: This exam was performed according to the departmental dose-optimization program which includes automated exposure control, adjustment of the mA and/or kV according to patient size and/or use of iterative reconstruction technique. COMPARISON:  12/11/2023, 03/14/2019 FINDINGS: Cardiovascular: Unenhanced imaging of the heart is unremarkable without pericardial effusion. Normal caliber of the thoracic aorta. Atherosclerosis of the aorta and coronary vasculature. Mediastinum/Nodes: No enlarged mediastinal or axillary lymph nodes. Thyroid gland, trachea, and esophagus demonstrate no significant findings. Lungs/Pleura: Stable position of the right-sided pigtail drainage catheter, coiled within the right anterior hemithorax. There are at least 2 side holes that  project outside of the pleural space, increasing likelihood of air leak. Subcutaneous gas is seen within the right chest wall along the course of the catheter. There is a persistent anterior right pneumothorax, volume estimated 10-15%. No mediastinal shift or tension effect. There are areas of dense consolidation within the dependent right lower lobe and right middle lobe, favoring atelectasis. Areas of ground-glass airspace disease elsewhere throughout the right lung, greatest in the mid and lower lung zones, could reflect residual hypoventilatory change or edema. The left chest is clear. Bullous emphysematous changes are seen bilaterally, greatest at the apices. Central airways are patent. Upper Abdomen: No acute abnormality. Musculoskeletal: No acute or destructive bony abnormalities. Reconstructed images demonstrate no additional findings. IMPRESSION: 1. Persistent anterior right pneumothorax, volume estimated 10-15%. No tension effect. 2. Right-sided pigtail pleural drainage catheter as above, with several proximal side port projecting outside the pleural space. This increases possibility of air leak. There is some subcutaneous gas along the course of the catheter, which could be due to air leak or related to catheter placement. 3. Scattered ground-glass opacities throughout the right lung, which may reflect re-expansion edema or residual hypoventilatory change. Denser areas of atelectasis are seen within the  right middle and right lower lobes. 4. Bullous emphysema, greatest at the apices. 5.  Aortic Atherosclerosis (ICD10-I70.0). Electronically Signed   By: Sharlet Salina M.D.   On: 12/11/2023 21:16      Joycelyn Das, MD  [facility] 12/13/2023  If 7PM-7AM, please contact night-coverage

## 2023-12-14 ENCOUNTER — Inpatient Hospital Stay (HOSPITAL_COMMUNITY): Payer: 59

## 2023-12-14 DIAGNOSIS — J9383 Other pneumothorax: Secondary | ICD-10-CM | POA: Diagnosis not present

## 2023-12-14 DIAGNOSIS — J9311 Primary spontaneous pneumothorax: Secondary | ICD-10-CM | POA: Diagnosis not present

## 2023-12-14 DIAGNOSIS — J9601 Acute respiratory failure with hypoxia: Secondary | ICD-10-CM

## 2023-12-14 DIAGNOSIS — E43 Unspecified severe protein-calorie malnutrition: Secondary | ICD-10-CM | POA: Insufficient documentation

## 2023-12-14 DIAGNOSIS — J449 Chronic obstructive pulmonary disease, unspecified: Secondary | ICD-10-CM | POA: Diagnosis not present

## 2023-12-14 DIAGNOSIS — J439 Emphysema, unspecified: Secondary | ICD-10-CM | POA: Diagnosis not present

## 2023-12-14 MED ORDER — HYDROMORPHONE 1 MG/ML IV SOLN: INTRAVENOUS | Status: DC

## 2023-12-14 MED ORDER — QUETIAPINE FUMARATE 25 MG PO TABS
50.0000 mg | ORAL_TABLET | Freq: Every day | ORAL | Status: DC
  Administered 2023-12-14 – 2023-12-19 (×6): 50 mg via ORAL
  Filled 2023-12-14: qty 2
  Filled 2023-12-14 (×2): qty 1
  Filled 2023-12-14: qty 2
  Filled 2023-12-14: qty 1
  Filled 2023-12-14: qty 2

## 2023-12-14 MED ORDER — SODIUM CHLORIDE 0.9% FLUSH: 9.0000 mL | INTRAVENOUS | Status: DC | PRN

## 2023-12-14 MED ORDER — ONDANSETRON HCL 4 MG/2ML IJ SOLN
4.0000 mg | Freq: Four times a day (QID) | INTRAMUSCULAR | Status: DC | PRN
  Administered 2023-12-15 – 2023-12-20 (×9): 4 mg via INTRAVENOUS
  Filled 2023-12-14 (×8): qty 2

## 2023-12-14 MED ORDER — DIPHENHYDRAMINE HCL 12.5 MG/5ML PO ELIX: 12.5000 mg | ORAL_SOLUTION | Freq: Four times a day (QID) | ORAL | Status: DC | PRN

## 2023-12-14 MED ORDER — ENSURE ENLIVE PO LIQD
237.0000 mL | Freq: Three times a day (TID) | ORAL | Status: DC
  Administered 2023-12-14 – 2023-12-20 (×16): 237 mL via ORAL
  Filled 2023-12-14 (×3): qty 237

## 2023-12-14 MED ORDER — NALOXONE HCL 0.4 MG/ML IJ SOLN: 0.4000 mg | INTRAMUSCULAR | Status: DC | PRN

## 2023-12-14 MED ORDER — THIAMINE MONONITRATE 100 MG PO TABS
100.0000 mg | ORAL_TABLET | Freq: Every day | ORAL | Status: DC
  Administered 2023-12-14 – 2023-12-20 (×6): 100 mg via ORAL
  Filled 2023-12-14 (×6): qty 1

## 2023-12-14 MED ORDER — DIPHENHYDRAMINE HCL 50 MG/ML IJ SOLN
12.5000 mg | Freq: Four times a day (QID) | INTRAMUSCULAR | Status: DC | PRN
  Administered 2023-12-18: 12.5 mg via INTRAVENOUS
  Filled 2023-12-14 (×2): qty 1

## 2023-12-14 MED ORDER — ADULT MULTIVITAMIN W/MINERALS CH
1.0000 | ORAL_TABLET | Freq: Every day | ORAL | Status: DC
  Administered 2023-12-14 – 2023-12-20 (×6): 1 via ORAL
  Filled 2023-12-14 (×6): qty 1

## 2023-12-14 MED ORDER — HYDROMORPHONE 1 MG/ML IV SOLN
INTRAVENOUS | Status: DC
  Administered 2023-12-14 – 2023-12-16 (×2): 30 mg via INTRAVENOUS
  Administered 2023-12-16: 1.5 mg via INTRAVENOUS
  Administered 2023-12-16: 3 mg via INTRAVENOUS
  Administered 2023-12-17: 3.9 mg via INTRAVENOUS
  Filled 2023-12-14 (×2): qty 30

## 2023-12-14 NOTE — Progress Notes (Addendum)
 Called by nursing to bedside for increased pain to right side of chest. Pt. Has continued sub q air from clavicle to upper abdomen. Chest tube is to suction, there is an intermittent leak in chamber 1 and 5. Sats are 96% on 2 L [place]. Will get a stat PCXR to re-evaluate pneumo and check CT placement. PCCM staff will re-evaluate after CXR.

## 2023-12-14 NOTE — Progress Notes (Addendum)
 Initial Nutrition Assessment  DOCUMENTATION CODES:   Underweight, Severe malnutrition in context of chronic illness  INTERVENTION:  Liberalize diet to promote PO intake  Ensure Enlive po BID, each supplement provides 350 kcal and 20 grams of protein. Magic cup TID with meals, each supplement provides 290 kcal and 9 grams of protein Multivitamin with minerals daily  Snacks daily  Monitor magnesium, potassium, and phosphorus daily for at least 3 days, MD to replete as needed, as pt is at slight risk for refeeding syndrome given  Recommend updated weight  NUTRITION DIAGNOSIS:   Severe Malnutrition related to chronic illness as evidenced by severe muscle depletion, severe fat depletion.   GOAL:   Patient will meet greater than or equal to 90% of their needs   MONITOR:   PO intake, Supplement acceptance, Labs, I & O's  REASON FOR ASSESSMENT:   Consult Assessment of nutrition requirement/status  ASSESSMENT:  63 y.o male presented with right-sided chest pain found to have large right-sided pneumothorax. PMH of DM, tobacco use, DDD, anxiety.  2/21 - Chest tube placed  2/22 - Chest tube non functional  2/23 - Chest tube replaced 2/24 - x-ray, Small right pneumothorax that has decreased in size, bullous emphysema.   Patient resting in bed, endorses some chest pain. PTA he reports he was eating ok, having 2 meals and snacking throughout the day. States he has always been small and being 125 lbs is normal for him. Reviewed weight history and patient seems to have been gradually losing weight since 2019. Weight upon admission appears to be stated. No recent weight history available for 2024. Recommend updated weight   Patient had good PO intake since admission with an average of 80% meals completed. However he reports he vomited last night and is weary of eating right now, had a couple bites of breakfast this morning. Multiple subs at bedside brought in by family. Patient very willing  to try protein shakes and anything to help him gain weight.   Cardiothoracic surgery consulted for consideration of surgical intervention to include bleb stapling and pleurodesis.    Admit weight: 56.7 kg - Stated  Current weight: 56.7 kg Stated   Average Meal Intake: 2/22-2/24: 66% intake x 6  recorded meals   Intake/Output Summary (Last 24 hours) at 12/14/2023 1242 Last data filed at 12/14/2023 0815 Gross per 24 hour  Intake 820 ml  Output 775 ml  Net 45 ml   Nutritionally Relevant Medications: Scheduled Meds:  docusate sodium  100 mg Oral BID   feeding supplement  237 mL Oral TID BM   multivitamin with minerals  1 tablet Oral Daily   thiamine  100 mg Oral Daily   Labs Reviewed: Sodium 125, Chloride 92, Calcium 8.5  CBG None HgbA1c None  NUTRITION - FOCUSED PHYSICAL EXAM:  Flowsheet Row Most Recent Value  Orbital Region Severe depletion  Upper Arm Region Severe depletion  Thoracic and Lumbar Region Severe depletion  Buccal Region Severe depletion  Temple Region Severe depletion  Clavicle Bone Region Severe depletion  Clavicle and Acromion Bone Region Severe depletion  Scapular Bone Region Severe depletion  Dorsal Hand Severe depletion  Patellar Region Severe depletion  Anterior Thigh Region Severe depletion  Posterior Calf Region Severe depletion  Edema (RD Assessment) None  Hair Reviewed  Eyes Reviewed  Mouth Reviewed  Skin Reviewed  Nails Reviewed       Diet Order:   Diet Order  Diet regular Room service appropriate? Yes; Fluid consistency: Thin  Diet effective now                   EDUCATION NEEDS:   Education needs have been addressed  Skin:  Skin Assessment: Reviewed RN Assessment  Last BM:  PTA  Height:   Ht Readings from Last 1 Encounters:  12/11/23 6\' 1"  (1.854 m)    Weight:   Wt Readings from Last 1 Encounters:  12/11/23 56.7 kg    Ideal Body Weight:  83.6 kg  BMI:  Body mass index is 16.49  kg/m.  Estimated Nutritional Needs:   Kcal:  1800-2000 kcal  Protein:  85-100 gm  Fluid:  >1.8L/day   Elliot Dally, RD Registered Dietitian  See Amion for more information

## 2023-12-14 NOTE — Consult Note (Cosign Needed)
 Reason for Consult: Right-sided spontaneous pneumothorax Referring Physician: CCM  Wayne Rowe is an 63 y.o. male.  HPI: We are asked to see this 63 year old male in cardiothoracic surgical consultation for consideration of surgical intervention for right-sided spontaneous pneumothorax.  The patient's past medical history is significant for diabetes, active tobacco use, polyarthritis, chronic pain and anxiety.  He presented to the emergency room with right-sided chest pain that was noted to be worse with inspiration.  He was described as stabbing in nature.  Chest x-ray in the ED revealed a large right-sided pneumothorax and a pigtail chest tube was placed.  Most recent x-ray on today's date shows a small right pneumothorax that has decreased in size.  There is also bilateral atelectasis.  He was also significant subcutaneous emphysema.  A CT scan done 12/11/2023 showed scattered groundglass opacities throughout the right lung which may reflect reexpansion edema or residual hypoventilatory changes.  Also of significance is bullous emphysema which is noted to be greatest at the apices.  His tobacco use has been for 40 years and he has known COPD on nebs.  He is not on home oxygen.  He is followed by a pain clinic for his chronic pain syndrome primarily associated with degenerative disc disease.  We are asked to see the patient for consideration of surgical intervention to include bleb stapling and pleurodesis.  Past Medical History:  Diagnosis Date   Anxiety    Arthritis    Back pain    DDD (degenerative disc disease)     Past Surgical History:  Procedure Laterality Date   BACK SURGERY      Family History  Problem Relation Age of Onset   Healthy Mother    Hypertension Father    Diabetes Father    Hyperlipidemia Father    Arthritis Sister    Heart attack Maternal Grandfather    Cancer Sister     Social History:  reports that he has been smoking cigarettes. He has never used smokeless  tobacco. He reports that he does not currently use alcohol. He reports that he does not currently use drugs after having used the following drugs: Marijuana.  Allergies: No Known Allergies  Medications: Prior to Admission:  Medications Prior to Admission  Medication Sig Dispense Refill Last Dose/Taking   aspirin EC 81 MG tablet Take 81-325 mg by mouth every 4 (four) hours as needed for moderate pain (pain score 4-6).   12/10/2023   carisoprodol (SOMA) 350 MG tablet Take 350 mg by mouth 4 (four) times daily.   12/10/2023   ibuprofen (ADVIL) 200 MG tablet Take 600 mg by mouth 4 (four) times daily as needed for moderate pain (pain score 4-6).   12/10/2023   lidocaine-prilocaine (EMLA) cream Apply 1 Application topically 4 (four) times daily as needed (Pain).   Past Week   ondansetron (ZOFRAN ODT) 4 MG disintegrating tablet Take 1 tablet (4 mg total) by mouth every 8 (eight) hours as needed for nausea or vomiting. 10 tablet 0 Past Week   Oxycodone HCl 10 MG TABS Take 10 mg by mouth every 3 (three) hours.   12/10/2023   tamsulosin (FLOMAX) 0.4 MG CAPS capsule Take 0.4 mg by mouth every evening.  0 12/10/2023   TRELEGY ELLIPTA 100-62.5-25 MCG/INH AEPB Inhale 1 puff into the lungs daily.   Past Week   VENTOLIN HFA 108 (90 Base) MCG/ACT inhaler Inhale 1-2 puffs into the lungs every 6 (six) hours as needed for wheezing or shortness of breath.  12/10/2023    Results for orders placed or performed during the hospital encounter of 12/11/23 (from the past 48 hours)  CBC     Status: Abnormal   Collection Time: 12/13/23  7:24 AM  Result Value Ref Range   WBC 9.8 4.0 - 10.5 K/uL   RBC 4.05 (L) 4.22 - 5.81 MIL/uL   Hemoglobin 12.4 (L) 13.0 - 17.0 g/dL   HCT 40.9 (L) 81.1 - 91.4 %   MCV 88.4 80.0 - 100.0 fL   MCH 30.6 26.0 - 34.0 pg   MCHC 34.6 30.0 - 36.0 g/dL   RDW 78.2 95.6 - 21.3 %   Platelets 293 150 - 400 K/uL   nRBC 0.0 0.0 - 0.2 %    Comment: Performed at [facility], [address],  [place], [place] [address]  Basic metabolic panel     Status: Abnormal   Collection Time: 12/13/23  7:24 AM  Result Value Ref Range   Sodium 125 (L) 135 - 145 mmol/L    Comment: DELTA CHECK NOTED   Potassium 4.5 3.5 - 5.1 mmol/L   Chloride 92 (L) 98 - 111 mmol/L   CO2 24 22 - 32 mmol/L   Glucose, Bld 109 (H) 70 - 99 mg/dL    Comment: Glucose reference range applies only to samples taken after fasting for at least 8 hours.   BUN 23 8 - 23 mg/dL   Creatinine, Ser 8.46 0.61 - 1.24 mg/dL   Calcium 8.5 (L) 8.9 - 10.3 mg/dL   GFR, Estimated >96 >29 mL/min    Comment: (NOTE) Calculated using the CKD-EPI Creatinine Equation (2021)    Anion gap 9 5 - 15    Comment: Performed at [facility], [address], [place], [place] [address]  Magnesium     Status: None   Collection Time: 12/13/23  7:24 AM  Result Value Ref Range   Magnesium 1.9 1.7 - 2.4 mg/dL    Comment: Performed at [facility], [address], [place], [place] [address]    DG CHEST PORT 1 VIEW Result Date: 12/14/2023 CLINICAL DATA:  Pneumothorax.  Right chest tube. EXAM: PORTABLE CHEST 1 VIEW COMPARISON:  12/13/2023 FINDINGS: Pigtail right chest tube is stable in the right upper chest. Small right pneumothorax that has decreased in size. Linear density in the right lateral chest probably represents atelectasis. Again noted is subcutaneous gas in the right chest and neck. Trachea is midline. Heart size is normal. Wispy densities in the left lower lung are suggestive for atelectasis. IMPRESSION: 1. Small right pneumothorax that has decreased in size. Right chest tube is stable. 2. Bilateral atelectasis. Electronically Signed   By: Richarda Overlie M.D.   On: 12/14/2023 08:42   DG CHEST PORT 1 VIEW Result Date: 12/13/2023 CLINICAL DATA:  Pneumothorax. EXAM: PORTABLE CHEST 1 VIEW COMPARISON:  December 12, 2023. FINDINGS: The heart size and mediastinal contours are within normal limits. Right-sided chest tube is again noted. Small right  apical pneumothorax is noted. Extensive subcutaneous emphysema is seen over right lateral chest wall. Left lung is clear. The visualized skeletal structures are unremarkable. IMPRESSION: Right-sided chest tube is again noted. Small right apical pneumothorax is again noted with extensive right-sided subcutaneous emphysema. Electronically Signed   By: Lupita Raider M.D.   On: 12/13/2023 08:08   DG Chest 1 View Result Date: 12/12/2023 CLINICAL DATA:  Chest tube EXAM: CHEST  1 VIEW COMPARISON:  12/12/2023 FINDINGS: Interval placement of right  chest tube with re-expansion of the right lung. No visible pneumothorax. Subcutaneous emphysema throughout the right chest wall and bilateral neck soft tissues. No confluent airspace opacities or effusions. Heart mediastinal contours within normal limits. IMPRESSION: Interval placement of right chest tube with re-expansion of the right lung. No visible pneumothorax. Right chest wall and neck subcutaneous emphysema. Electronically Signed   By: Charlett Nose M.D.   On: 12/12/2023 23:20   DG CHEST PORT 1 VIEW Result Date: 12/12/2023 CLINICAL DATA:  Pneumothorax follow-up EXAM: PORTABLE CHEST 1 VIEW COMPARISON:  Chest x-ray 12/12/2023. FINDINGS: Moderate-to-large right pneumothorax has increased in size. There is no mediastinal shift. There is atelectasis in the right lower lung. Right chest wall emphysema is similar to the prior study. Left lung is clear. Cardiomediastinal silhouette is within normal limits. Osseous structures are stable. IMPRESSION: Moderate-to-large right pneumothorax has increased in size. No mediastinal shift. Electronically Signed   By: Darliss Cheney M.D.   On: 12/12/2023 21:28   DG CHEST PORT 1 VIEW Result Date: 12/12/2023 CLINICAL DATA:  Pneumothorax, chest tube removal EXAM: PORTABLE CHEST 1 VIEW COMPARISON:  12/12/2023 at 5:57 a.m. FINDINGS: Two frontal views of the chest demonstrate interval removal of the right chest tube, with a moderate  recurrent right pneumothorax volume estimated at least 25%. There is no tension effect or mediastinal shift. Patchy areas of consolidation within the right perihilar region likely reflect atelectasis. Left chest is clear. Extensive subcutaneous gas throughout the right chest wall. IMPRESSION: 1. Recurrent right pneumothorax after right chest tube removal, volume estimated at least 25%. No mediastinal shift or tension effect. 2. Right perihilar consolidation favoring atelectasis. 3. Extensive subcutaneous gas throughout the right chest wall. Critical Value/emergent results were called by telephone at the time of interpretation on 12/12/2023 at 7:35 pm to provider DR CROSLEY, who verbally acknowledged these results. Electronically Signed   By: Sharlet Salina M.D.   On: 12/12/2023 19:36   Narrative & Impression  CLINICAL DATA:  Spontaneous pneumothorax, right chest tube   EXAM: CT CHEST WITHOUT CONTRAST   TECHNIQUE: Multidetector CT imaging of the chest was performed following the standard protocol without IV contrast.   RADIATION DOSE REDUCTION: This exam was performed according to the departmental dose-optimization program which includes automated exposure control, adjustment of the mA and/or kV according to patient size and/or use of iterative reconstruction technique.   COMPARISON:  12/11/2023, 03/14/2019   FINDINGS: Cardiovascular: Unenhanced imaging of the heart is unremarkable without pericardial effusion. Normal caliber of the thoracic aorta. Atherosclerosis of the aorta and coronary vasculature.   Mediastinum/Nodes: No enlarged mediastinal or axillary lymph nodes. Thyroid gland, trachea, and esophagus demonstrate no significant findings.   Lungs/Pleura: Stable position of the right-sided pigtail drainage catheter, coiled within the right anterior hemithorax. There are at least 2 side holes that project outside of the pleural space, increasing likelihood of air leak. Subcutaneous  gas is seen within the right chest wall along the course of the catheter.   There is a persistent anterior right pneumothorax, volume estimated 10-15%. No mediastinal shift or tension effect. There are areas of dense consolidation within the dependent right lower lobe and right middle lobe, favoring atelectasis. Areas of ground-glass airspace disease elsewhere throughout the right lung, greatest in the mid and lower lung zones, could reflect residual hypoventilatory change or edema.   The left chest is clear. Bullous emphysematous changes are seen bilaterally, greatest at the apices. Central airways are patent.   Upper Abdomen: No acute abnormality.  Musculoskeletal: No acute or destructive bony abnormalities. Reconstructed images demonstrate no additional findings.   IMPRESSION: 1. Persistent anterior right pneumothorax, volume estimated 10-15%. No tension effect. 2. Right-sided pigtail pleural drainage catheter as above, with several proximal side port projecting outside the pleural space. This increases possibility of air leak. There is some subcutaneous gas along the course of the catheter, which could be due to air leak or related to catheter placement. 3. Scattered ground-glass opacities throughout the right lung, which may reflect re-expansion edema or residual hypoventilatory change. Denser areas of atelectasis are seen within the right middle and right lower lobes. 4. Bullous emphysema, greatest at the apices. 5.  Aortic Atherosclerosis (ICD10-I70.0).     Electronically Signed   By: Sharlet Salina M.D.   On: 12/11/2023 21:16   Review of Systems  Constitutional:  Positive for fatigue.  HENT:  Positive for congestion, sore throat and trouble swallowing.   Respiratory:  Positive for cough, shortness of breath and wheezing.   Cardiovascular:  Positive for chest pain.  Gastrointestinal:  Positive for nausea and vomiting.       + reflux  Musculoskeletal:  Positive  for back pain.  Neurological:  Positive for light-headedness.  Psychiatric/Behavioral:  Positive for sleep disturbance.        +anxiety   Blood pressure 98/61, pulse 76, temperature 98.3 F (36.8 C), temperature source Oral, resp. rate 18, height 6\' 1"  (1.854 m), weight 56.7 kg, SpO2 99%. Physical Exam Constitutional:      General: He is not in acute distress.    Appearance: He is well-developed and normal weight.  HENT:     Head: Normocephalic.  Eyes:     Extraocular Movements: Extraocular movements intact.     Pupils: Pupils are equal, round, and reactive to light.  Neck:     Thyroid: No thyromegaly.     Vascular: No hepatojugular reflux or JVD.     Trachea: No tracheal deviation.  Cardiovascular:     Rate and Rhythm: Normal rate and regular rhythm. Extrasystoles are present.    Pulses:          Posterior tibial pulses are 2+ on the right side and 2+ on the left side.     Heart sounds: No murmur heard.    No friction rub. No gallop.  Pulmonary:     Effort: Pulmonary effort is normal. No tachypnea or accessory muscle usage.     Breath sounds: Normal breath sounds. No stridor.     Comments: Crackles on right related to SQ air Chest:     Chest wall: Tenderness present. No mass.     Comments: Tender RLQ Abdominal:     General: Bowel sounds are normal.     Palpations: Abdomen is soft.     Tenderness: There is abdominal tenderness. There is no guarding or rebound.     Comments: Tender RLQ  Musculoskeletal:     Right lower leg: No tenderness. No edema.     Left lower leg: No tenderness. No edema.  Lymphadenopathy:     Cervical: No cervical adenopathy.  Skin:    General: Skin is warm and dry.     Capillary Refill: Capillary refill takes less than 2 seconds.     Coloration: Skin is not cyanotic or pale.     Findings: No rash.     Nails: There is no clubbing.  Neurological:     General: No focal deficit present.     Mental Status: He is alert.  Comments: Right hand  tremor  Psychiatric:        Mood and Affect: Mood is anxious.     Assessment/Plan: Spontaneous right pneumothorax related to bullous emphysema and long-term smoker.  Appears to be a good candidate for VATS, bleb resection and pleurodesis.  The surgeon will evaluate the patient and relevant studies to determine timing and finalize decision making on surgical suitability.  Rowe Clack PA-C 12/14/2023, 9:39 AM    Agree with above Will plan for R RATS, wedge resection, and pleurodesis later this week.  Wayne Rowe

## 2023-12-14 NOTE — Progress Notes (Signed)
   12/14/23 1204  TOC Brief Assessment  Insurance and Status Reviewed  Patient has primary care physician Yes  Home environment has been reviewed wife  Prior level of function: independent  Prior/Current Home Services No current home services  Social Drivers of Health Review SDOH reviewed no interventions necessary  Transition of care needs no transition of care needs at this time     Await CVTS consult for possible VATS, bleb resection and pleurodesis.     Transition of Care Department [facility]) has reviewed patient and  will continue to monitor patient advancement through interdisciplinary progression rounds. If new patient transition needs arise, please place a TOC consult.

## 2023-12-14 NOTE — Plan of Care (Signed)
  Problem: Education: Goal: Knowledge of General Education information will improve Description: Including pain rating scale, medication(s)/side effects and non-pharmacologic comfort measures Outcome: Progressing   Problem: Health Behavior/Discharge Planning: Goal: Ability to manage health-related needs will improve Outcome: Progressing   Problem: Clinical Measurements: Goal: Ability to maintain clinical measurements within normal limits will improve Outcome: Progressing Goal: Will remain free from infection Outcome: Progressing Goal: Diagnostic test results will improve Outcome: Progressing Goal: Respiratory complications will improve Outcome: Progressing Goal: Cardiovascular complication will be avoided Outcome: Progressing   Problem: Activity: Goal: Risk for activity intolerance will decrease Outcome: Progressing   Problem: Nutrition: Goal: Adequate nutrition will be maintained Outcome: Progressing   Problem: Coping: Goal: Level of anxiety will decrease Outcome: Progressing   Problem: Elimination: Goal: Will not experience complications related to bowel motility Outcome: Progressing Goal: Will not experience complications related to urinary retention Outcome: Progressing   Problem: Pain Managment: Goal: General experience of comfort will improve and/or be controlled Outcome: Progressing   Problem: Safety: Goal: Ability to remain free from injury will improve Outcome: Progressing   Problem: Skin Integrity: Goal: Risk for impaired skin integrity will decrease Outcome: Progressing   Pt had vomited large amount of undigested food, doctor notified, Compazine given w/good results.

## 2023-12-14 NOTE — Progress Notes (Addendum)
 NAME:  KYLIE GROS, MRN:  [id number], DOB:  01/25/1961, LOS: 3 ADMISSION DATE:  12/11/2023, CONSULTATION DATE:  12/11/23 REFERRING MD:  Rennis Chris - [facility], CHIEF COMPLAINT:  chest pain   History of Present Illness:  63 yo M PMH COPD, tobacco use, chronic pain presented to [facility] ED 2/21 w CC chest pain, associated SOB. Started yesterday, progressively worsened. R sided. Worse w deep inspiration. No falls or trauma.  No hx of previous PTX.  Cxr in ED w large r ptx for which a chest tube was placed.  Pt was  admitted to [facility], transferred to [facility] at [facility] and PCCM consulted in this setting   Pertinent  Medical History  Tobacco use COPD Anxiety Chronic pain Arthritis  DDD   Significant Hospital Events: Including procedures, antibiotic start and stop dates in addition to other pertinent events   2/21 ED at [facility] for chest pain/ SOB. R ptx, pigtail placed. Txf to [facility]. [facility] admit, PCCM consult  2/22 pigtail non functional, cxr with overall improvement. Dc chest tube 2/23 had to have chest tube replaced , increased prn pain meds as significant pain 2/24 Pain better, still has significant sub q air and pain to mild palpation. States his pain is better controlled on prn's   Interim History / Subjective:  Chest tube replaced 2/23 due to sub Q air  Increased pain 2/23, prn's increased     Objective   Blood pressure 98/61, pulse 76, temperature 98.3 F (36.8 C), temperature source Oral, resp. rate 18, height 6\' 1"  (1.854 m), weight 56.7 kg, SpO2 99%.        Intake/Output Summary (Last 24 hours) at 12/14/2023 2130 Last data filed at 12/14/2023 0815 Gross per 24 hour  Intake 820 ml  Output 775 ml  Net 45 ml   Filed Weights   12/11/23 0759  Weight: 56.7 kg    Examination:  General: Thin chronically ill M NAD Neuro: AAOx3 , appropriate HEENT: NCAt poor dentition Lungs: R chest tube  + air leak  CV: cap refill brisk GI: thin abd GU: defer  No Labs to review>> drawn at 9:23 am  CXR shows  stable right Chest tube placement  Small right pneumothorax has decreased in size since CT placement 2/23 Sats 99% on 2 L [place] Net + 750 cc's 1000 cc Urine output last 24 hours  T Max 100.3  Resolved Hospital Problem list     Assessment & Plan:   Spontaneous r ptx COPD; bullous emphysema Tobacco use Opiate use  -chest tube dc 2/22 as was non-functional. Had to be replaced  P -cont chest tube to sxn -AM CXR -probable CVTS consult Monday >> seeing now -incr fq of his PRN dilaudid, cont toradol, SCH and PRN oxy, add APAP -- with his baseline opiate use, he might end up being a guy who would do well with a PCA, much better pain control.   Best Practice (right click and "Reselect all SmartList Selections" daily)   Per primary  Labs   CBC: Recent Labs  Lab 12/11/23 0933 12/12/23 0625 12/13/23 0724  WBC 9.3 11.6* 9.8  NEUTROABS 7.1  --   --   HGB 15.6 13.3 12.4*  HCT 46.5 39.5 35.8*  MCV 91.0 89.8 88.4  PLT 398 335 293    Basic Metabolic Panel: Recent Labs  Lab 12/11/23 0933 12/12/23 0625 12/13/23 0724  NA 137 132* 125*  K 4.4 4.2 4.5  CL 100 97* 92*  CO2 27 27 24  GLUCOSE 111* 97 109*  BUN 16 25* 23  CREATININE 0.93 1.09 1.24  CALCIUM 9.1 8.7* 8.5*  MG  --   --  1.9   GFR: Estimated Creatinine Clearance: 49.5 mL/min (by C-G formula based on SCr of 1.24 mg/dL). Recent Labs  Lab 12/11/23 0933 12/12/23 0625 12/13/23 0724  WBC 9.3 11.6* 9.8    Liver Function Tests: Recent Labs  Lab 12/12/23 0625  AST 20  ALT 18  ALKPHOS 55  BILITOT 0.6  PROT 6.0*  ALBUMIN 3.2*   No results for input(s): "LIPASE", "AMYLASE" in the last 168 hours. No results for input(s): "AMMONIA" in the last 168 hours.  ABG    Component Value Date/Time   HCO3 23.2 03/14/2019 2049   ACIDBASEDEF 4.4 (H) 03/14/2019 2049   O2SAT 84.7 03/14/2019 2049     Coagulation Profile: No results for input(s): "INR", "PROTIME" in the last 168 hours.  Cardiac Enzymes: No results for  input(s): "CKTOTAL", "CKMB", "CKMBINDEX", "TROPONINI" in the last 168 hours.  HbA1C: No results found for: "HGBA1C"  CBG: No results for input(s): "GLUCAP" in the last 168 hours.   CCT na  Bevelyn Ngo, MSN, AGACNP-BC [facility] Pulmonary/Critical Care Medicine See Amion for personal pager PCCM on call pager [phone]  12/14/2023, 9:58 AM

## 2023-12-14 NOTE — Care Management Important Message (Signed)
 Important Message  Patient Details  Name: Wayne Rowe MRN: [id number] Date of Birth: Feb 22, 1961   Important Message Given:  Yes - Medicare IM     Sherilyn Banker 12/14/2023, 3:49 PM

## 2023-12-14 NOTE — Progress Notes (Signed)
 CXR this afternoon similar-- if anything tiny morning pneumothorax appears smaller.  air similar.   Steffanie Dunn, DO 12/14/23 7:43 PM [facility]  For contact information, see Amion. If no response to pager, please call PCCM consult pager. After hours, 7PM- 7AM, please call Elink.

## 2023-12-14 NOTE — Progress Notes (Addendum)
 PROGRESS NOTE  Wayne Rowe ZOX:[id number] DOB: [birth year] DOA: 12/11/2023 PCP: Elfredia Nevins, MD   LOS: 3 days   Brief narrative:   Wayne Rowe is a 63 y.o. male with medical history significant of diabetes, active tobacco use, COPD polyosteoarthritis, chronic pain, anxiety presented to hospital with right-sided stabbing chest pain deep with inspiration with worsening shortness of breath.  Patient is current smoker.  In the ED patient was noted to have large right-sided pneumothorax and Pigtail chest tube was placed.  Labs were unremarkable but with slight troponin elevation.  Patient was then considered for admission to the hospital for further evaluation and treatment  Assessment/Plan: Principal Problem:   Pneumothorax Active Problems:   Spontaneous pneumothorax   Protein-calorie malnutrition, severe  Right-sided pneumothorax, recurrent pneumothorax Severe chest pain. Possibly secondary to bullous emphysema.  CT scan of the chest done showed persistent anterior right pneumothorax around 10 to 15% without any tension.  Scattered groundglass opacities in the right lung.  Bullous emphysema at the apices.  Chest tube was placed in and was managed by PCCM but subsequently came out.  On the evening 12/12/2023 patient had increased pneumothorax so needed chest tube placement again and was briefly on nonrebreather mask.  Patient still has severe pain while taking a deep breath.  Had to be adjusted on Dilaudid regimen which seems to have helped him some now.  Continue analgesia, supplemental oxygen, follow PCCM recommendations.  Patient might need to see CT surgery.  COPD Continue DuoNebs, no overt wheezing.  Continue pain management.  Appears to be compensated at this time.   Current tobacco abuse -- 1-2 PPD x 40 years.  Continue nicotine patch.  Quitting smoking has been reemphasized.  Chronic pain syndrome Arthritis Degenerative disc disease  - Follows with pain clinic outpatient,  continue Percocet and Toradol, Protonix.  High tolerance to narcotics.  Has been added on muscle relaxant as well.  On IV Dilaudid with higher doses with some relief.  Mild hyponatremia.  Will continue to monitor.  Sodium of 125.  Could be secondary to pain.  Check levels in AM.  Mild leukocytosis.  Likely reactive.  WBC on 12/13/2023 was 9.8.  Check labs in AM.  Severe protein  calorie malnutrition.Body mass index is 16.49 kg/m.  Nutrition Status: Nutrition Problem: Severe Malnutrition Etiology: chronic illness Signs/Symptoms: severe muscle depletion, severe fat depletion Interventions: Refer to RD note for recommendations Continue nutritional supplements.    DVT prophylaxis: enoxaparin (LOVENOX) injection 40 mg Start: 12/11/23 2200   Disposition: Home likely in 2 to 3 days.  Status is: Inpatient Remains inpatient appropriate because: Pneumothorax status post chest tube placement, pending clinical improvement, severe pain.    Code Status:     Code Status: Full Code  Family Communication: None  Consultants: PCCM  Procedures: Right sided chest tube placement x2  Anti-infectives:  None  Anti-infectives (From admission, onward)    None       Subjective: Today, patient was seen and examined at bedside.  States that the pain in the chest is still there but is better with increased dose of Dilaudid.  Denies any nausea or vomiting.  Has mild cough.  Objective: Vitals:   12/14/23 0813 12/14/23 1534  BP: 98/61 (!) 115/56  Pulse: 76 92  Resp: 18 16  Temp: 98.3 F (36.8 C) 99.5 F (37.5 C)  SpO2: 99% 96%    Intake/Output Summary (Last 24 hours) at 12/14/2023 1612 Last data filed at 12/14/2023 0815 Gross per 24  hour  Intake 580 ml  Output 375 ml  Net 205 ml   Filed Weights   12/11/23 0759  Weight: 56.7 kg   Body mass index is 16.49 kg/m.   Physical Exam:  GENERAL: Patient is alert awake and oriented. Not in obvious distress.  Thinly built.  On nasal  cannula oxygen appears chronically ill. HENT: No scleral pallor or icterus. Pupils equally reactive to light. Oral mucosa is moist NECK: is supple, no gross swelling noted. CHEST: Diminished breath sounds mostly on the right side with chest tube in place.   CVS: S1 and S2 heard, no murmur. Regular rate and rhythm.  ABDOMEN: Soft, non-tender, bowel sounds are present. EXTREMITIES: No edema.  Thin extremities CNS: Cranial nerves are intact. No focal motor deficits. SKIN: warm and dry without rashes.  Data Review: I have personally reviewed the following laboratory data and studies,  CBC: Recent Labs  Lab 12/11/23 0933 12/12/23 0625 12/13/23 0724  WBC 9.3 11.6* 9.8  NEUTROABS 7.1  --   --   HGB 15.6 13.3 12.4*  HCT 46.5 39.5 35.8*  MCV 91.0 89.8 88.4  PLT 398 335 293   Basic Metabolic Panel: Recent Labs  Lab 12/11/23 0933 12/12/23 0625 12/13/23 0724  NA 137 132* 125*  K 4.4 4.2 4.5  CL 100 97* 92*  CO2 27 27 24   GLUCOSE 111* 97 109*  BUN 16 25* 23  CREATININE 0.93 1.09 1.24  CALCIUM 9.1 8.7* 8.5*  MG  --   --  1.9   Liver Function Tests: Recent Labs  Lab 12/12/23 0625  AST 20  ALT 18  ALKPHOS 55  BILITOT 0.6  PROT 6.0*  ALBUMIN 3.2*   No results for input(s): "LIPASE", "AMYLASE" in the last 168 hours. No results for input(s): "AMMONIA" in the last 168 hours. Cardiac Enzymes: No results for input(s): "CKTOTAL", "CKMB", "CKMBINDEX", "TROPONINI" in the last 168 hours. BNP (last 3 results) No results for input(s): "BNP" in the last 8760 hours.  ProBNP (last 3 results) No results for input(s): "PROBNP" in the last 8760 hours.  CBG: No results for input(s): "GLUCAP" in the last 168 hours. No results found for this or any previous visit (from the past 240 hours).   Studies: DG CHEST PORT 1 VIEW Result Date: 12/14/2023 CLINICAL DATA:  Pneumothorax.  Right chest tube. EXAM: PORTABLE CHEST 1 VIEW COMPARISON:  12/13/2023 FINDINGS: Pigtail right chest tube is  stable in the right upper chest. Small right pneumothorax that has decreased in size. Linear density in the right lateral chest probably represents atelectasis. Again noted is subcutaneous gas in the right chest and neck. Trachea is midline. Heart size is normal. Wispy densities in the left lower lung are suggestive for atelectasis. IMPRESSION: 1. Small right pneumothorax that has decreased in size. Right chest tube is stable. 2. Bilateral atelectasis. Electronically Signed   By: Richarda Overlie M.D.   On: 12/14/2023 08:42   DG CHEST PORT 1 VIEW Result Date: 12/13/2023 CLINICAL DATA:  Pneumothorax. EXAM: PORTABLE CHEST 1 VIEW COMPARISON:  December 12, 2023. FINDINGS: The heart size and mediastinal contours are within normal limits. Right-sided chest tube is again noted. Small right apical pneumothorax is noted. Extensive subcutaneous emphysema is seen over right lateral chest wall. Left lung is clear. The visualized skeletal structures are unremarkable. IMPRESSION: Right-sided chest tube is again noted. Small right apical pneumothorax is again noted with extensive right-sided subcutaneous emphysema. Electronically Signed   By: Zenda Alpers.D.  On: 12/13/2023 08:08   DG Chest 1 View Result Date: 12/12/2023 CLINICAL DATA:  Chest tube EXAM: CHEST  1 VIEW COMPARISON:  12/12/2023 FINDINGS: Interval placement of right chest tube with re-expansion of the right lung. No visible pneumothorax. Subcutaneous emphysema throughout the right chest wall and bilateral neck soft tissues. No confluent airspace opacities or effusions. Heart mediastinal contours within normal limits. IMPRESSION: Interval placement of right chest tube with re-expansion of the right lung. No visible pneumothorax. Right chest wall and neck subcutaneous emphysema. Electronically Signed   By: Charlett Nose M.D.   On: 12/12/2023 23:20   DG CHEST PORT 1 VIEW Result Date: 12/12/2023 CLINICAL DATA:  Pneumothorax follow-up EXAM: PORTABLE CHEST 1 VIEW  COMPARISON:  Chest x-ray 12/12/2023. FINDINGS: Moderate-to-large right pneumothorax has increased in size. There is no mediastinal shift. There is atelectasis in the right lower lung. Right chest wall emphysema is similar to the prior study. Left lung is clear. Cardiomediastinal silhouette is within normal limits. Osseous structures are stable. IMPRESSION: Moderate-to-large right pneumothorax has increased in size. No mediastinal shift. Electronically Signed   By: Darliss Cheney M.D.   On: 12/12/2023 21:28      Joycelyn Das, MD  [facility] 12/14/2023  If 7PM-7AM, please contact night-coverage

## 2023-12-15 ENCOUNTER — Inpatient Hospital Stay (HOSPITAL_COMMUNITY): Payer: 59

## 2023-12-15 DIAGNOSIS — J9383 Other pneumothorax: Secondary | ICD-10-CM | POA: Diagnosis not present

## 2023-12-15 DIAGNOSIS — J439 Emphysema, unspecified: Secondary | ICD-10-CM | POA: Diagnosis not present

## 2023-12-15 DIAGNOSIS — J9601 Acute respiratory failure with hypoxia: Secondary | ICD-10-CM | POA: Diagnosis not present

## 2023-12-15 LAB — MAGNESIUM: Magnesium: 2 mg/dL (ref 1.7–2.4)

## 2023-12-15 LAB — BASIC METABOLIC PANEL
Anion gap: 6 (ref 5–15)
BUN: 20 mg/dL (ref 8–23)
CO2: 27 mmol/L (ref 22–32)
Calcium: 8 mg/dL — ABNORMAL LOW (ref 8.9–10.3)
Chloride: 96 mmol/L — ABNORMAL LOW (ref 98–111)
Creatinine, Ser: 1.07 mg/dL (ref 0.61–1.24)
GFR, Estimated: >60 mL/min (ref >60)
Glucose, Bld: 103 mg/dL — ABNORMAL HIGH (ref 70–99)
Potassium: 4.3 mmol/L (ref 3.5–5.1)
Sodium: 129 mmol/L — ABNORMAL LOW (ref 135–145)

## 2023-12-15 LAB — CBC
HCT: 34.2 % — ABNORMAL LOW (ref 39.0–52.0)
Hemoglobin: 11.4 g/dL — ABNORMAL LOW (ref 13.0–17.0)
MCH: 30 pg (ref 26.0–34.0)
MCHC: 33.3 g/dL (ref 30.0–36.0)
MCV: 90 fL (ref 80.0–100.0)
Platelets: 304 10*3/uL (ref 150–400)
RBC: 3.8 MIL/uL — ABNORMAL LOW (ref 4.22–5.81)
RDW: 11.9 % (ref 11.5–15.5)
WBC: 7.8 10*3/uL (ref 4.0–10.5)
nRBC: 0 % (ref 0.0–0.2)

## 2023-12-15 LAB — PHOSPHORUS: Phosphorus: 4.8 mg/dL — ABNORMAL HIGH (ref 2.5–4.6)

## 2023-12-15 NOTE — Plan of Care (Signed)
  Problem: Education: Goal: Knowledge of General Education information will improve Description: Including pain rating scale, medication(s)/side effects and non-pharmacologic comfort measures Outcome: Progressing   Problem: Clinical Measurements: Goal: Ability to maintain clinical measurements within normal limits will improve Outcome: Progressing   Problem: Elimination: Goal: Will not experience complications related to bowel motility Outcome: Progressing Goal: Will not experience complications related to urinary retention Outcome: Progressing   Problem: Pain Managment: Goal: General experience of comfort will improve and/or be controlled Outcome: Progressing   Problem: Skin Integrity: Goal: Risk for impaired skin integrity will decrease Outcome: Progressing

## 2023-12-15 NOTE — Progress Notes (Signed)
 PROGRESS NOTE  Wayne Rowe HYQ:[id number] DOB: 1961/01/30 DOA: 12/11/2023 PCP: Elfredia Nevins, MD   LOS: 4 days   Brief narrative:   Wayne Rowe is a 63 y.o. male with medical history significant of diabetes, active tobacco use, COPD polyosteoarthritis, chronic pain, anxiety presented to hospital with right-sided stabbing chest pain deep with inspiration with worsening shortness of breath.  Patient is current smoker.  In the ED, patient was noted to have large right-sided pneumothorax and Pigtail chest tube was placed.  Labs were unremarkable but with slight troponin elevation.  Patient was then considered for admission to the hospital for further evaluation and treatment.  After hospitalization, patient has received chest tube placement with development of subcutaneous emphysema.  Had intractable pain and is currently on Dilaudid drip.  Pending clinical improvement and likely surgical intervention by CT surgery.  Assessment/Plan: Principal Problem:   Pneumothorax Active Problems:   Spontaneous pneumothorax   Protein-calorie malnutrition, severe  Right-sided pneumothorax, recurrent pneumothorax Severe chest pain. Subcutaneous emphysema Possibly secondary to bullous emphysema.  CT scan of the chest with bullous emphysema at the apices.  Chest tube was placed in and was managed by PCCM but subsequently came out.  On the evening 12/12/2023 patient had increased pneumothorax so needed chest tube placement again and was briefly on nonrebreather mask.  Patient has developed extensive subcutaneous emphysema.  Had intractable pain in did not respond to IV Dilaudid push show was started on Dilaudid PCA since 12/14/2023 with significant relief.  Pulmonary following and recommended CT surgery evaluation.  At this time patient appears to be a good candidate for VATS bleb resection and pleurodesis likely this week.  COPD Continue DuoNebs, no overt wheezing.  On supplemental oxygen at 2.5 L.    Current tobacco abuse -- 1-2 PPD x 40 years.  Continue nicotine patch.    Chronic pain syndrome Arthritis Degenerative disc disease  - Follows with pain clinic outpatient, continue Percocet and Toradol, Protonix.  High tolerance to narcotics.  Has been added on muscle relaxant as well.  On IV Dilaudid with higher doses with some relief.  Mild hyponatremia.  Will continue to monitor.  Sodium of 129 from 125.[name]  Could be secondary to pain.  Check levels in AM.  Mild leukocytosis.  Likely reactive.  Resolved.    Severe protein  calorie malnutrition.Body mass index is 16.49 kg/m.  Nutrition Status: Nutrition Problem: Severe Malnutrition Etiology: chronic illness Signs/Symptoms: severe muscle depletion, severe fat depletion Interventions: Refer to RD note for recommendations Continue nutritional supplements.    DVT prophylaxis: enoxaparin (LOVENOX) injection 40 mg Start: 12/11/23 2200   Disposition: Home uncertain at this time likely need surgical intervention.  Likely in 3 to 4 days.  Status is: Inpatient Remains inpatient appropriate because: Pneumothorax status post chest tube placement, pending clinical improvement, severe pain on Dilaudid drip, possible need for VATS.    Code Status:     Code Status: Full Code  Family Communication: None  Consultants: PCCM Cardiothoracic surgery  Procedures: Right sided chest tube placement x2  Anti-infectives:  None  Anti-infectives (From admission, onward)    None       Subjective: Today, patient was seen and examined at bedside.  Patient concerned about  bubling in the subcutaneous tissue of his chest, hand and abdomen.  But denies any increasing dyspnea.  Chest pain getting better on Dilaudid drip.   Objective: Vitals:   12/15/23 0910 12/15/23 0911  BP:    Pulse:    Resp:  17   Temp:    SpO2: 96% 96%    Intake/Output Summary (Last 24 hours) at 12/15/2023 1023 Last data filed at 12/15/2023 0905 Gross per 24 hour   Intake 1210 ml  Output 2920 ml  Net -1710 ml   Filed Weights   12/11/23 0759  Weight: 56.7 kg   Body mass index is 16.49 kg/m.   Physical Exam:  GENERAL: Patient is alert awake and oriented. Not in obvious distress.  Thinly built.  On nasal cannula oxygen appears chronically ill.  On supplemental oxygen subcutaneous emphysema noted. HENT: No scleral pallor or icterus. Pupils equally reactive to light. Oral mucosa is moist NECK: is supple, no gross swelling noted. CHEST: Diminished breath sounds mostly on the right side with chest tube in place.   CVS: S1 and S2 heard, no murmur. Regular rate and rhythm.  ABDOMEN: Soft, non-tender, bowel sounds are present. EXTREMITIES: No edema.  Thin extremities CNS: Cranial nerves are intact. No focal motor deficits. SKIN: warm and dry without rashes.  Data Review: I have personally reviewed the following laboratory data and studies,  CBC: Recent Labs  Lab 12/11/23 0933 12/12/23 0625 12/13/23 0724 12/15/23 0456  WBC 9.3 11.6* 9.8 7.8  NEUTROABS 7.1  --   --   --   HGB 15.6 13.3 12.4* 11.4*  HCT 46.5 39.5 35.8* 34.2*  MCV 91.0 89.8 88.4 90.0  PLT 398 335 293 304   Basic Metabolic Panel: Recent Labs  Lab 12/11/23 0933 12/12/23 0625 12/13/23 0724 12/15/23 0456  NA 137 132* 125* 129*  K 4.4 4.2 4.5 4.3  CL 100 97* 92* 96*  CO2 27 27 24 27   GLUCOSE 111* 97 109* 103*  BUN 16 25* 23 20  CREATININE 0.93 1.09 1.24 1.07  CALCIUM 9.1 8.7* 8.5* 8.0*  MG  --   --  1.9 2.0  PHOS  --   --   --  4.8*   Liver Function Tests: Recent Labs  Lab 12/12/23 0625  AST 20  ALT 18  ALKPHOS 55  BILITOT 0.6  PROT 6.0*  ALBUMIN 3.2*   No results for input(s): "LIPASE", "AMYLASE" in the last 168 hours. No results for input(s): "AMMONIA" in the last 168 hours. Cardiac Enzymes: No results for input(s): "CKTOTAL", "CKMB", "CKMBINDEX", "TROPONINI" in the last 168 hours. BNP (last 3 results) No results for input(s): "BNP" in the last 8760  hours.  ProBNP (last 3 results) No results for input(s): "PROBNP" in the last 8760 hours.  CBG: No results for input(s): "GLUCAP" in the last 168 hours. No results found for this or any previous visit (from the past 240 hours).   Studies: DG CHEST PORT 1 VIEW Result Date: 12/14/2023 CLINICAL DATA:  Dyspnea EXAM: PORTABLE CHEST 1 VIEW COMPARISON:  12/14/2023 FINDINGS: A right chest tube remains in place. Small residual right apical pneumothorax similar to prior study. No progression. Subcutaneous emphysema throughout the right chest is also similar. Mild linear atelectasis or scarring in the lungs. No effusions. Mediastinal contours appear intact. IMPRESSION: Stable appearance since previous study. Electronically Signed   By: Burman Nieves M.D.   On: 12/14/2023 18:57   DG CHEST PORT 1 VIEW Result Date: 12/14/2023 CLINICAL DATA:  Pneumothorax.  Right chest tube. EXAM: PORTABLE CHEST 1 VIEW COMPARISON:  12/13/2023 FINDINGS: Pigtail right chest tube is stable in the right upper chest. Small right pneumothorax that has decreased in size. Linear density in the right lateral chest probably represents atelectasis. Again noted is subcutaneous  gas in the right chest and neck. Trachea is midline. Heart size is normal. Wispy densities in the left lower lung are suggestive for atelectasis. IMPRESSION: 1. Small right pneumothorax that has decreased in size. Right chest tube is stable. 2. Bilateral atelectasis. Electronically Signed   By: Richarda Overlie M.D.   On: 12/14/2023 08:42      Joycelyn Das, MD  [facility] 12/15/2023  If 7PM-7AM, please contact night-coverage

## 2023-12-15 NOTE — Progress Notes (Signed)
   NAME:  Wayne Rowe, MRN:  [id number], DOB:  1961-07-13, LOS: 4 ADMISSION DATE:  12/11/2023, CONSULTATION DATE:  12/11/23 REFERRING MD:  Rennis Chris - [facility], CHIEF COMPLAINT:  chest pain   History of Present Illness:  63 yo M PMH COPD, tobacco use, chronic pain presented to [facility] ED 2/21 w CC chest pain, associated SOB. Started yesterday, progressively worsened. R sided. Worse w deep inspiration. No falls or trauma.  No hx of previous PTX.  Cxr in ED w large r ptx for which a chest tube was placed.  Pt was  admitted to [facility], transferred to [facility] at [facility] and PCCM consulted in this setting   Pertinent  Medical History  Tobacco use COPD Anxiety Chronic pain Arthritis  DDD   Significant Hospital Events: Including procedures, antibiotic start and stop dates in addition to other pertinent events   2/21 ED at [facility] for chest pain/ SOB. R ptx, pigtail placed. Txf to [facility]. [facility] admit, PCCM consult  2/22 pigtail non functional, cxr with overall improvement. Dc chest tube 2/23 had to have chest tube replaced , increased prn pain meds as significant pain 2/24 Pain better, still has significant sub q air and pain to mild palpation. States his pain is better controlled on prn's   Interim History / Subjective:  No overnight issues. Having nausea. Maxed out on dilaudid pca.   Objective   Blood pressure 111/66, pulse 91, temperature 98.4 F (36.9 C), temperature source Oral, resp. rate 20, height 6\' 1"  (1.854 m), weight 56.7 kg, SpO2 95%.    FiO2 (%):  [28 %-35 %] 35 %   Intake/Output Summary (Last 24 hours) at 12/15/2023 1249 Last data filed at 12/15/2023 1210 Gross per 24 hour  Intake 1410 ml  Output 3270 ml  Net -1860 ml   Filed Weights   12/11/23 0759  Weight: 56.7 kg    Examination:  Thin chronically ill appearing man On nasal cannula Breath sounds diminished Pigtail catheter in position with intermittent small airleak on expiratory phase Tidaling appropriately, minimal drainage.   Chest xray  this am shows stable pigtail drainage catheter  Resolved Hospital Problem list     Assessment & Plan:   Spontaneous recurrent ptx COPD; bullous emphysema Tobacco use Opiate use -chest tube dc 2/22 as was non-functional. Had to be replaced  P - plan for vats wedge resection and pleurodesis later this week - cont chest tube to sxn until surgery . - am chest xray for chest tube positioning and stability.  - pain control - pccm will follow peripherally  - please call with questions  Durel Salts, MD Pulmonary and Critical Care Medicine [facility] 12/15/2023 12:52 PM Pager: see AMION  If no response to pager, please call critical care on call (see AMION) until 7pm After 7:00 pm call Elink

## 2023-12-15 NOTE — Plan of Care (Signed)

## 2023-12-16 DIAGNOSIS — J9383 Other pneumothorax: Secondary | ICD-10-CM | POA: Diagnosis not present

## 2023-12-16 LAB — CBC
HCT: 34.3 % — ABNORMAL LOW (ref 39.0–52.0)
Hemoglobin: 11.5 g/dL — ABNORMAL LOW (ref 13.0–17.0)
MCH: 30.3 pg (ref 26.0–34.0)
MCHC: 33.5 g/dL (ref 30.0–36.0)
MCV: 90.5 fL (ref 80.0–100.0)
Platelets: 326 10*3/uL (ref 150–400)
RBC: 3.79 MIL/uL — ABNORMAL LOW (ref 4.22–5.81)
RDW: 11.9 % (ref 11.5–15.5)
WBC: 7.5 10*3/uL (ref 4.0–10.5)
nRBC: 0 % (ref 0.0–0.2)

## 2023-12-16 LAB — BASIC METABOLIC PANEL
Anion gap: 8 (ref 5–15)
BUN: 23 mg/dL (ref 8–23)
CO2: 29 mmol/L (ref 22–32)
Calcium: 8.5 mg/dL — ABNORMAL LOW (ref 8.9–10.3)
Chloride: 94 mmol/L — ABNORMAL LOW (ref 98–111)
Creatinine, Ser: 1.04 mg/dL (ref 0.61–1.24)
GFR, Estimated: >60 mL/min (ref >60)
Glucose, Bld: 87 mg/dL (ref 70–99)
Potassium: 4.5 mmol/L (ref 3.5–5.1)
Sodium: 131 mmol/L — ABNORMAL LOW (ref 135–145)

## 2023-12-16 LAB — PHOSPHORUS: Phosphorus: 4.1 mg/dL (ref 2.5–4.6)

## 2023-12-16 LAB — MAGNESIUM: Magnesium: 2 mg/dL (ref 1.7–2.4)

## 2023-12-16 NOTE — Progress Notes (Signed)
 Mobility Specialist Progress Note:    12/16/23 1208  Mobility  Activity  (bed level exercises)  Level of Assistance Standby assist, set-up cues, supervision of patient - no hands on  Assistive Device None  Activity Response Tolerated well  Mobility Referral Yes  Mobility visit 1 Mobility  Mobility Specialist Start Time (ACUTE ONLY) 1045  Mobility Specialist Stop Time (ACUTE ONLY) 1059  Mobility Specialist Time Calculation (min) (ACUTE ONLY) 14 min   Pt received in bed declining mobility, despite encouragement. Pt agreeable to perform BLE bed level exercises. C/o pain at chest tube site, otherwise no c/o. MS will attempt to f/u w/ pt this afternoon if time allows. Left in bed w/ call bell and personal belongings in reach. All needs met.  Thompson Grayer Mobility Specialist  Please contact vis Secure Chat or  Rehab Office [phone]

## 2023-12-16 NOTE — Progress Notes (Signed)
 PROGRESS NOTE  Wayne Rowe RUE:[id number] DOB: [birth year] DOA: 12/11/2023 PCP: Elfredia Nevins, MD   LOS: 5 days   Brief narrative:   Wayne Rowe is a 63 y.o. male with medical history significant of diabetes, active tobacco use, COPD polyosteoarthritis, chronic pain, anxiety presented to hospital with right-sided stabbing chest pain deep with inspiration with worsening shortness of breath.  Patient is current smoker.  In the ED, patient was noted to have large right-sided pneumothorax and Pigtail chest tube was placed.  Labs were unremarkable but with slight troponin elevation.  Patient was then considered for admission to the hospital for further evaluation and treatment.  After hospitalization, patient has received chest tube placement with development of subcutaneous emphysema.  Had intractable pain and is currently on Dilaudid drip.  At this time patient has been seen by CT surgery and plan for surgical intervention this week.  Assessment/Plan: Principal Problem:   Pneumothorax Active Problems:   Spontaneous pneumothorax   Protein-calorie malnutrition, severe  Right-sided pneumothorax, recurrent pneumothorax Severe chest pain. Subcutaneous emphysema Possibly secondary to bullous emphysema.  CT scan of the chest with bullous emphysema at the apices.  Chest tube was placed in and was managed by PCCM but subsequently came out.  On the evening 12/12/2023 patient had increased pneumothorax so needed chest tube placement again and was briefly on nonrebreather mask.  Patient has developed extensive subcutaneous emphysema.  Had intractable pain in did not respond to IV Dilaudid push show was started on Dilaudid PCA since 12/14/2023 with significant relief.  Pulmonary following and recommended CT surgery evaluation.  At this time patient appears to be a good candidate for VATS ,bleb resection and pleurodesis likely this week by CT surgery.[name]  COPD Continue DuoNebs, no overt wheezing.  On  supplemental oxygen at 2.5 L.   Current tobacco abuse -- 1-2 PPD x 40 years.  Continue nicotine patch.    Chronic pain syndrome Arthritis Degenerative disc disease  - Follows with pain clinic outpatient, continue Percocet and Toradol, Protonix.  High tolerance to narcotics.  Has been added on muscle relaxant as well.  Currently on Dilaudid pump.  Mild hyponatremia.  Will continue to monitor.  Sodium of 131 from initial 125.[name]  Could be secondary to pain.  Asymptomatic.  Mild leukocytosis.  Likely reactive.  Resolved.    Severe protein  calorie malnutrition.Body mass index is 16.49 kg/m.  Nutrition Status: Nutrition Problem: Severe Malnutrition Etiology: chronic illness Signs/Symptoms: severe muscle depletion, severe fat depletion Interventions: Refer to RD note for recommendations Continue nutritional supplements.  Courage to oral nutrition.    DVT prophylaxis: enoxaparin (LOVENOX) injection 40 mg Start: 12/11/23 2200   Disposition: Home, uncertain at this time, likely need surgical intervention.  Likely in 3 to 4 days.  Status is: Inpatient Remains inpatient appropriate because: Pneumothorax status post chest tube placement, pending clinical improvement, severe pain on Dilaudid drip,  need for VATS.    Code Status:     Code Status: Full Code  Family Communication: None  Consultants: PCCM Cardiothoracic surgery  Procedures: Right sided chest tube placement x2  Anti-infectives:  None  Anti-infectives (From admission, onward)    None       Subjective: Today, patient was seen and examined at bedside.  Planes of mild nausea but no vomiting.  Pain is better controlled with Dilaudid drip.  Denies dyspnea cough congestion but has sharp pain in the chest on taking a deep breath.  Objective: Vitals:   12/16/23 1478 12/16/23 2956  BP: 104/60   Pulse: 78   Resp: 16 15  Temp: 98.4 F (36.9 C)   SpO2: 98% 98%    Intake/Output Summary (Last 24 hours) at 12/16/2023  0905 Last data filed at 12/16/2023 0630 Gross per 24 hour  Intake 810 ml  Output 1100 ml  Net -290 ml   Filed Weights   12/11/23 0759  Weight: 56.7 kg   Body mass index is 16.49 kg/m.   Physical Exam:  GENERAL: Patient is alert awake and oriented. Not in obvious distress.  Thinly built.  On nasal cannula oxygen appears chronically ill.  On supplemental oxygen, subcutaneous emphysema noted over the right chest wall arm and shoulder area. HENT: No scleral pallor or icterus. Pupils equally reactive to light. Oral mucosa is moist NECK: is supple, no gross swelling noted. CHEST: Diminished breath sounds mostly on the right side with chest tube in place.   CVS: S1 and S2 heard, no murmur. Regular rate and rhythm.  ABDOMEN: Soft, non-tender, bowel sounds are present. EXTREMITIES: No edema.  Thin extremities CNS: Cranial nerves are intact. No focal motor deficits. SKIN: warm and dry without rashes.  Data Review: I have personally reviewed the following laboratory data and studies,  CBC: Recent Labs  Lab 12/11/23 0933 12/12/23 0625 12/13/23 0724 12/15/23 0456 12/16/23 0651  WBC 9.3 11.6* 9.8 7.8 7.5  NEUTROABS 7.1  --   --   --   --   HGB 15.6 13.3 12.4* 11.4* 11.5*  HCT 46.5 39.5 35.8* 34.2* 34.3*  MCV 91.0 89.8 88.4 90.0 90.5  PLT 398 335 293 304 326   Basic Metabolic Panel: Recent Labs  Lab 12/11/23 0933 12/12/23 0625 12/13/23 0724 12/15/23 0456 12/16/23 0651  NA 137 132* 125* 129* 131*  K 4.4 4.2 4.5 4.3 4.5  CL 100 97* 92* 96* 94*  CO2 27 27 24 27 29   GLUCOSE 111* 97 109* 103* 87  BUN 16 25* 23 20 23   CREATININE 0.93 1.09 1.24 1.07 1.04  CALCIUM 9.1 8.7* 8.5* 8.0* 8.5*  MG  --   --  1.9 2.0 2.0  PHOS  --   --   --  4.8* 4.1   Liver Function Tests: Recent Labs  Lab 12/12/23 0625  AST 20  ALT 18  ALKPHOS 55  BILITOT 0.6  PROT 6.0*  ALBUMIN 3.2*   No results for input(s): "LIPASE", "AMYLASE" in the last 168 hours. No results for input(s): "AMMONIA" in  the last 168 hours. Cardiac Enzymes: No results for input(s): "CKTOTAL", "CKMB", "CKMBINDEX", "TROPONINI" in the last 168 hours. BNP (last 3 results) No results for input(s): "BNP" in the last 8760 hours.  ProBNP (last 3 results) No results for input(s): "PROBNP" in the last 8760 hours.  CBG: No results for input(s): "GLUCAP" in the last 168 hours. No results found for this or any previous visit (from the past 240 hours).   Studies: DG Chest Port 1 View Result Date: 12/15/2023 CLINICAL DATA:  Pneumothorax. EXAM: PORTABLE CHEST 1 VIEW COMPARISON:  Radiograph dated 12/14/2023. FINDINGS: Right-sided chest tube in similar position. No definite pneumothorax. Bibasilar streaky densities as seen previously. No consolidative changes. No pleural effusion. The cardiac silhouette is within limits. Right chest wall emphysema. IMPRESSION: No interval change. Electronically Signed   By: Elgie Collard M.D.   On: 12/15/2023 11:51   DG CHEST PORT 1 VIEW Result Date: 12/14/2023 CLINICAL DATA:  Dyspnea EXAM: PORTABLE CHEST 1 VIEW COMPARISON:  12/14/2023 FINDINGS: A right chest tube  remains in place. Small residual right apical pneumothorax similar to prior study. No progression. Subcutaneous emphysema throughout the right chest is also similar. Mild linear atelectasis or scarring in the lungs. No effusions. Mediastinal contours appear intact. IMPRESSION: Stable appearance since previous study. Electronically Signed   By: Burman Nieves M.D.   On: 12/14/2023 18:57      Joycelyn Das, MD  [facility] 12/16/2023  If 7PM-7AM, please contact night-coverage

## 2023-12-17 ENCOUNTER — Encounter (HOSPITAL_COMMUNITY): Payer: Self-pay | Admitting: Family Medicine

## 2023-12-17 ENCOUNTER — Inpatient Hospital Stay (HOSPITAL_COMMUNITY): Payer: 59 | Admitting: Anesthesiology

## 2023-12-17 ENCOUNTER — Other Ambulatory Visit: Payer: Self-pay

## 2023-12-17 ENCOUNTER — Inpatient Hospital Stay (HOSPITAL_COMMUNITY): Payer: 59

## 2023-12-17 ENCOUNTER — Encounter (HOSPITAL_COMMUNITY)
Admission: EM | Disposition: A | Discharge status: Home / Self Care | Payer: Self-pay | Source: Home / Self Care | Attending: Internal Medicine

## 2023-12-17 DIAGNOSIS — J449 Chronic obstructive pulmonary disease, unspecified: Secondary | ICD-10-CM

## 2023-12-17 DIAGNOSIS — J439 Emphysema, unspecified: Secondary | ICD-10-CM

## 2023-12-17 DIAGNOSIS — J930 Spontaneous tension pneumothorax: Secondary | ICD-10-CM | POA: Diagnosis not present

## 2023-12-17 DIAGNOSIS — Z09 Encounter for follow-up examination after completed treatment for conditions other than malignant neoplasm: Secondary | ICD-10-CM

## 2023-12-17 DIAGNOSIS — F17210 Nicotine dependence, cigarettes, uncomplicated: Secondary | ICD-10-CM

## 2023-12-17 DIAGNOSIS — J9311 Primary spontaneous pneumothorax: Secondary | ICD-10-CM

## 2023-12-17 DIAGNOSIS — J9383 Other pneumothorax: Secondary | ICD-10-CM

## 2023-12-17 HISTORY — PX: INTERCOSTAL NERVE BLOCK: SHX5021

## 2023-12-17 LAB — ABO/RH: ABO/RH(D): O POS

## 2023-12-17 LAB — TYPE AND SCREEN
ABO/RH(D): O POS
Antibody Screen: NEGATIVE

## 2023-12-17 LAB — SURGICAL PCR SCREEN
MRSA, PCR: POSITIVE — AB
Staphylococcus aureus: POSITIVE — AB

## 2023-12-17 SURGERY — WEDGE RESECTION, LUNG, ROBOT-ASSISTED, THORACOSCOPIC
Anesthesia: General | Site: Chest | Laterality: Right

## 2023-12-17 MED ORDER — PROPOFOL 10 MG/ML IV BOLUS
INTRAVENOUS | Status: DC | PRN
Start: 2023-12-17 — End: 2023-12-17
  Administered 2023-12-17: 50 mg via INTRAVENOUS
  Administered 2023-12-17: 100 mg via INTRAVENOUS

## 2023-12-17 MED ORDER — PHENYLEPHRINE HCL (PRESSORS) 10 MG/ML IV SOLN: INTRAVENOUS | Status: DC | PRN

## 2023-12-17 MED ORDER — BUPIVACAINE LIPOSOME 1.3 % IJ SUSP
INTRAMUSCULAR | Status: AC
  Filled 2023-12-17: qty 20

## 2023-12-17 MED ORDER — ACETAMINOPHEN 500 MG PO TABS
1000.0000 mg | ORAL_TABLET | Freq: Four times a day (QID) | ORAL | Status: DC
  Administered 2023-12-17 – 2023-12-20 (×11): 1000 mg via ORAL
  Filled 2023-12-17 (×13): qty 2

## 2023-12-17 MED ORDER — HYDROMORPHONE HCL 1 MG/ML IJ SOLN
INTRAMUSCULAR | Status: AC
  Filled 2023-12-17: qty 1

## 2023-12-17 MED ORDER — KETOROLAC TROMETHAMINE 15 MG/ML IJ SOLN
15.0000 mg | Freq: Four times a day (QID) | INTRAMUSCULAR | Status: DC | PRN
  Administered 2023-12-18: 15 mg via INTRAVENOUS
  Filled 2023-12-17 (×2): qty 1

## 2023-12-17 MED ORDER — LACTATED RINGERS IV SOLN: INTRAVENOUS | Status: DC

## 2023-12-17 MED ORDER — PHENYLEPHRINE HCL-NACL 20-0.9 MG/250ML-% IV SOLN
INTRAVENOUS | Status: DC | PRN
  Administered 2023-12-17: 50 ug/min via INTRAVENOUS

## 2023-12-17 MED ORDER — AMISULPRIDE (ANTIEMETIC) 5 MG/2ML IV SOLN
10.0000 mg | Freq: Once | INTRAVENOUS | Status: AC | PRN
  Administered 2023-12-17: 10 mg via INTRAVENOUS

## 2023-12-17 MED ORDER — ORAL CARE MOUTH RINSE: 15.0000 mL | Freq: Once | OROMUCOSAL | Status: AC

## 2023-12-17 MED ORDER — ROCURONIUM BROMIDE 100 MG/10ML IV SOLN
INTRAVENOUS | Status: DC | PRN
  Administered 2023-12-17: 20 mg via INTRAVENOUS
  Administered 2023-12-17: 30 mg via INTRAVENOUS

## 2023-12-17 MED ORDER — BISACODYL 5 MG PO TBEC
10.0000 mg | DELAYED_RELEASE_TABLET | Freq: Every day | ORAL | Status: DC
  Administered 2023-12-17 – 2023-12-20 (×4): 10 mg via ORAL
  Filled 2023-12-17 (×4): qty 2

## 2023-12-17 MED ORDER — CHLORHEXIDINE GLUCONATE 0.12 % MT SOLN
OROMUCOSAL | Status: AC
Start: 2023-12-17 — End: 2023-12-17
  Administered 2023-12-17: 15 mL via OROMUCOSAL
  Filled 2023-12-17: qty 15

## 2023-12-17 MED ORDER — SODIUM CHLORIDE FLUSH 0.9 % IV SOLN
INTRAVENOUS | Status: DC | PRN
  Administered 2023-12-17: 100 mL

## 2023-12-17 MED ORDER — PANTOPRAZOLE SODIUM 40 MG PO TBEC: 40.0000 mg | DELAYED_RELEASE_TABLET | Freq: Every day | ORAL | Status: DC

## 2023-12-17 MED ORDER — AMISULPRIDE (ANTIEMETIC) 5 MG/2ML IV SOLN
INTRAVENOUS | Status: AC
  Filled 2023-12-17: qty 4

## 2023-12-17 MED ORDER — ALBUMIN HUMAN 5 % IV SOLN: INTRAVENOUS | Status: DC | PRN

## 2023-12-17 MED ORDER — MORPHINE SULFATE (PF) 2 MG/ML IV SOLN
2.0000 mg | Freq: Once | INTRAVENOUS | Status: DC
  Filled 2023-12-17: qty 1

## 2023-12-17 MED ORDER — SODIUM CHLORIDE (PF) 0.9 % IJ SOLN
INTRAMUSCULAR | Status: AC
  Filled 2023-12-17: qty 50

## 2023-12-17 MED ORDER — CHLORHEXIDINE GLUCONATE 0.12 % MT SOLN: 15.0000 mL | Freq: Once | OROMUCOSAL | Status: AC

## 2023-12-17 MED ORDER — CEFAZOLIN SODIUM-DEXTROSE 2-4 GM/100ML-% IV SOLN
2.0000 g | Freq: Three times a day (TID) | INTRAVENOUS | Status: AC
  Administered 2023-12-17 – 2023-12-18 (×2): 2 g via INTRAVENOUS
  Filled 2023-12-17 (×2): qty 100

## 2023-12-17 MED ORDER — ACETAMINOPHEN 160 MG/5ML PO SOLN: 1000.0000 mg | Freq: Four times a day (QID) | ORAL | Status: DC

## 2023-12-17 MED ORDER — TRAMADOL HCL 50 MG PO TABS
50.0000 mg | ORAL_TABLET | Freq: Four times a day (QID) | ORAL | Status: DC | PRN
  Administered 2023-12-19 (×2): 100 mg via ORAL
  Filled 2023-12-17 (×2): qty 2

## 2023-12-17 MED ORDER — ACETAMINOPHEN 500 MG PO TABS
ORAL_TABLET | ORAL | Status: AC
  Administered 2023-12-17: 1000 mg via ORAL
  Filled 2023-12-17: qty 2

## 2023-12-17 MED ORDER — SUGAMMADEX SODIUM 200 MG/2ML IV SOLN
INTRAVENOUS | Status: DC | PRN
Start: 2023-12-17 — End: 2023-12-17
  Administered 2023-12-17: 200 mg via INTRAVENOUS

## 2023-12-17 MED ORDER — FENTANYL CITRATE (PF) 250 MCG/5ML IJ SOLN
INTRAMUSCULAR | Status: DC | PRN
Start: 2023-12-17 — End: 2023-12-17
  Administered 2023-12-17 (×3): 50 ug via INTRAVENOUS

## 2023-12-17 MED ORDER — EPHEDRINE SULFATE-NACL 50-0.9 MG/10ML-% IV SOSY
PREFILLED_SYRINGE | INTRAVENOUS | Status: DC | PRN
  Administered 2023-12-17: 10 mg via INTRAVENOUS

## 2023-12-17 MED ORDER — SUCCINYLCHOLINE CHLORIDE 200 MG/10ML IV SOSY
PREFILLED_SYRINGE | INTRAVENOUS | Status: DC | PRN
  Administered 2023-12-17: 100 mg via INTRAVENOUS

## 2023-12-17 MED ORDER — ACETAMINOPHEN 500 MG PO TABS: 1000.0000 mg | ORAL_TABLET | Freq: Once | ORAL | Status: AC

## 2023-12-17 MED ORDER — MIDAZOLAM HCL 2 MG/2ML IJ SOLN
INTRAMUSCULAR | Status: AC
  Filled 2023-12-17: qty 2

## 2023-12-17 MED ORDER — OXYCODONE HCL 5 MG PO TABS: 5.0000 mg | ORAL_TABLET | Freq: Once | ORAL | Status: DC | PRN

## 2023-12-17 MED ORDER — BUPIVACAINE HCL (PF) 0.5 % IJ SOLN
INTRAMUSCULAR | Status: AC
  Filled 2023-12-17: qty 30

## 2023-12-17 MED ORDER — HYDROMORPHONE HCL 1 MG/ML IJ SOLN
0.2500 mg | INTRAMUSCULAR | Status: DC | PRN
Start: 2023-12-17 — End: 2023-12-17
  Administered 2023-12-17 (×4): 0.5 mg via INTRAVENOUS

## 2023-12-17 MED ORDER — LIDOCAINE HCL (CARDIAC) PF 100 MG/5ML IV SOSY
PREFILLED_SYRINGE | INTRAVENOUS | Status: DC | PRN
  Administered 2023-12-17: 50 mg via INTRAVENOUS

## 2023-12-17 MED ORDER — DEXAMETHASONE SODIUM PHOSPHATE 10 MG/ML IJ SOLN
INTRAMUSCULAR | Status: DC | PRN
  Administered 2023-12-17: 10 mg via INTRAVENOUS

## 2023-12-17 MED ORDER — CEFAZOLIN SODIUM-DEXTROSE 2-3 GM-%(50ML) IV SOLR
INTRAVENOUS | Status: DC | PRN
  Administered 2023-12-17: 2 g via INTRAVENOUS

## 2023-12-17 MED ORDER — OXYCODONE HCL 5 MG PO TABS
5.0000 mg | ORAL_TABLET | ORAL | Status: DC | PRN
  Administered 2023-12-17: 10 mg via ORAL
  Administered 2023-12-17 – 2023-12-18 (×2): 5 mg via ORAL
  Administered 2023-12-18: 10 mg via ORAL
  Filled 2023-12-17 (×3): qty 2
  Filled 2023-12-17: qty 1

## 2023-12-17 MED ORDER — SENNOSIDES-DOCUSATE SODIUM 8.6-50 MG PO TABS
1.0000 | ORAL_TABLET | Freq: Every day | ORAL | Status: DC
  Administered 2023-12-18 – 2023-12-19 (×2): 1 via ORAL
  Filled 2023-12-17 (×2): qty 1

## 2023-12-17 MED ORDER — OXYCODONE HCL 5 MG/5ML PO SOLN: 5.0000 mg | Freq: Once | ORAL | Status: DC | PRN

## 2023-12-17 MED ORDER — ONDANSETRON HCL 4 MG/2ML IJ SOLN: 4.0000 mg | Freq: Once | INTRAMUSCULAR | Status: DC | PRN

## 2023-12-17 MED ORDER — SALINE SPRAY 0.65 % NA SOLN
1.0000 | NASAL | Status: DC | PRN
  Administered 2023-12-17 – 2023-12-19 (×2): 1 via NASAL
  Filled 2023-12-17 (×2): qty 44

## 2023-12-17 MED ORDER — FENTANYL CITRATE (PF) 250 MCG/5ML IJ SOLN
INTRAMUSCULAR | Status: AC
  Filled 2023-12-17: qty 5

## 2023-12-17 MED ORDER — PHENYLEPHRINE 80 MCG/ML (10ML) SYRINGE FOR IV PUSH (FOR BLOOD PRESSURE SUPPORT)
PREFILLED_SYRINGE | INTRAVENOUS | Status: DC | PRN
Start: 2023-12-17 — End: 2023-12-17
  Administered 2023-12-17: 240 ug via INTRAVENOUS
  Administered 2023-12-17: 160 ug via INTRAVENOUS
  Administered 2023-12-17: 240 ug via INTRAVENOUS

## 2023-12-17 MED ORDER — MORPHINE SULFATE (PF) 2 MG/ML IV SOLN
2.0000 mg | INTRAVENOUS | Status: DC | PRN
  Administered 2023-12-17 – 2023-12-20 (×16): 2 mg via INTRAVENOUS
  Filled 2023-12-17 (×16): qty 1

## 2023-12-17 MED ORDER — MIDAZOLAM HCL 2 MG/2ML IJ SOLN
INTRAMUSCULAR | Status: DC | PRN
  Administered 2023-12-17: 2 mg via INTRAVENOUS

## 2023-12-17 MED ORDER — 0.9 % SODIUM CHLORIDE (POUR BTL) OPTIME
TOPICAL | Status: DC | PRN
  Administered 2023-12-17: 2000 mL

## 2023-12-17 SURGICAL SUPPLY — 74 items
BLADE CLIPPER SURG (BLADE) ×1 IMPLANT
CANISTER SUCT 3000ML PPV (MISCELLANEOUS) ×2 IMPLANT
CANNULA REDUCER 12-8 DVNC XI (CANNULA) ×2 IMPLANT
CATH THORACIC 28FR (CATHETERS) ×1 IMPLANT
CAUTERY SPATULA MNPLR 1.7 DVNC (INSTRUMENTS) IMPLANT
CHLORAPREP W/TINT 26 (MISCELLANEOUS) ×1 IMPLANT
CLEANER TIP ELECTROSURG 2X2 (MISCELLANEOUS) IMPLANT
CLIP TI MEDIUM 6 (CLIP) IMPLANT
CNTNR URN SCR LID CUP LEK RST (MISCELLANEOUS) ×5 IMPLANT
CONN ST 1/4X3/8 BEN (MISCELLANEOUS) IMPLANT
DEFOGGER SCOPE WARMER CLEARIFY (MISCELLANEOUS) ×1 IMPLANT
DERMABOND ADVANCED .7 DNX12 (GAUZE/BANDAGES/DRESSINGS) ×1 IMPLANT
DRAIN CHANNEL 28F RND 3/8 FF (WOUND CARE) IMPLANT
DRAIN CHANNEL 32F RND 10.7 FF (WOUND CARE) IMPLANT
DRAPE ARM DVNC X/XI (DISPOSABLE) ×4 IMPLANT
DRAPE COLUMN DVNC XI (DISPOSABLE) ×1 IMPLANT
DRAPE CV SPLIT W-CLR ANES SCRN (DRAPES) ×1 IMPLANT
DRAPE HALF SHEET 40X57 (DRAPES) ×1 IMPLANT
DRAPE SURG ORHT 6 SPLT 77X108 (DRAPES) ×1 IMPLANT
DRSG TEGADERM 2-3/8X2-3/4 SM (GAUZE/BANDAGES/DRESSINGS) IMPLANT
ELECT BLADE 6.5 EXT (BLADE) IMPLANT
ELECT REM PT RETURN 9FT ADLT (ELECTROSURGICAL) ×1 IMPLANT
ELECTRODE REM PT RTRN 9FT ADLT (ELECTROSURGICAL) ×1 IMPLANT
FORCEPS BPLR LNG DVNC XI (INSTRUMENTS) IMPLANT
FORCEPS CADIERE DVNC XI (FORCEP) IMPLANT
GAUZE KITTNER 4X5 RF (MISCELLANEOUS) ×1 IMPLANT
GAUZE SPONGE 4X4 12PLY STRL (GAUZE/BANDAGES/DRESSINGS) ×1 IMPLANT
GLOVE BIO SURGEON STRL SZ7 (GLOVE) IMPLANT
GLOVE BIO SURGEON STRL SZ7.5 (GLOVE) ×2 IMPLANT
GLOVE SURG SS PI 8.0 STRL IVOR (GLOVE) ×1 IMPLANT
GOWN STRL REUS W/ TWL LRG LVL3 (GOWN DISPOSABLE) ×2 IMPLANT
GOWN STRL REUS W/ TWL XL LVL3 (GOWN DISPOSABLE) ×2 IMPLANT
GOWN STRL REUS W/TWL 2XL LVL3 (GOWN DISPOSABLE) ×1 IMPLANT
GRASPER TIP-UP FEN DVNC XI (INSTRUMENTS) IMPLANT
HEMOSTAT SURGICEL 2X14 (HEMOSTASIS) ×3 IMPLANT
KIT BASIN OR (CUSTOM PROCEDURE TRAY) ×1 IMPLANT
KIT TURNOVER KIT B (KITS) ×1 IMPLANT
NDL 22X1.5 STRL (OR ONLY) (MISCELLANEOUS) ×1 IMPLANT
NEEDLE 22X1.5 STRL (OR ONLY) (MISCELLANEOUS) ×1 IMPLANT
NS IRRIG 1000ML POUR BTL (IV SOLUTION) ×3 IMPLANT
PACK CHEST (CUSTOM PROCEDURE TRAY) ×1 IMPLANT
PAD ARMBOARD 7.5X6 YLW CONV (MISCELLANEOUS) ×5 IMPLANT
PORT ACCESS TROCAR AIRSEAL 12 (TROCAR) ×1 IMPLANT
RELOAD STAPLE 45 3.5 BLU DVNC (STAPLE) IMPLANT
RELOAD STAPLE 45 4.3 GRN DVNC (STAPLE) IMPLANT
SEAL UNIV 5-12 XI (MISCELLANEOUS) ×4 IMPLANT
SET TRI-LUMEN FLTR TB AIRSEAL (TUBING) ×1 IMPLANT
SOL ELECTROSURG ANTI STICK (MISCELLANEOUS) ×1 IMPLANT
SOLUTION ELECTROSURG ANTI STCK (MISCELLANEOUS) ×1 IMPLANT
STAPLER 45 SUREFORM DVNC (STAPLE) IMPLANT
STAPLER RELOAD 3.5X45 BLU DVNC (STAPLE) ×3 IMPLANT
STAPLER RELOAD 4.3X45 GRN DVNC (STAPLE) ×2 IMPLANT
STOPCOCK 4 WAY LG BORE MALE ST (IV SETS) ×1 IMPLANT
SUT PDS AB 1 CTX 36 (SUTURE) IMPLANT
SUT PROLENE 4-0 RB1 .5 CRCL 36 (SUTURE) IMPLANT
SUT SILK 1 MH (SUTURE) ×1 IMPLANT
SUT SILK 2 0 SH (SUTURE) IMPLANT
SUT SILK 2 0SH CR/8 30 (SUTURE) IMPLANT
SUT VIC AB 1 CTX36XBRD ANBCTR (SUTURE) IMPLANT
SUT VIC AB 2-0 CT1 TAPERPNT 27 (SUTURE) ×1 IMPLANT
SUT VIC AB 3-0 SH 27X BRD (SUTURE) ×3 IMPLANT
SUT VICRYL 0 TIES 12 18 (SUTURE) ×1 IMPLANT
SUT VICRYL 0 UR6 27IN ABS (SUTURE) ×2 IMPLANT
SYR 10ML LL (SYRINGE) ×1 IMPLANT
SYR 20ML LL LF (SYRINGE) ×1 IMPLANT
SYR 50ML LL SCALE MARK (SYRINGE) ×1 IMPLANT
SYSTEM RETRIEVAL ANCHOR 8 (MISCELLANEOUS) IMPLANT
SYSTEM SAHARA CHEST DRAIN ATS (WOUND CARE) ×1 IMPLANT
TAPE CLOTH 4X10 WHT NS (GAUZE/BANDAGES/DRESSINGS) ×1 IMPLANT
TAPE CLOTH SURG 4X10 WHT LF (GAUZE/BANDAGES/DRESSINGS) IMPLANT
TOWEL GREEN STERILE (TOWEL DISPOSABLE) ×1 IMPLANT
TRAY FOLEY MTR SLVR 16FR STAT (SET/KITS/TRAYS/PACK) ×1 IMPLANT
TUBING EXTENTION W/L.L. (IV SETS) ×1 IMPLANT
WATER STERILE IRR 1000ML POUR (IV SOLUTION) ×1 IMPLANT

## 2023-12-17 NOTE — Transfer of Care (Signed)
 Immediate Anesthesia Transfer of Care Note  Patient: Wayne Rowe  Procedure(s) Performed: XI ROBOTIC ASSISTED THORACOSCOPY-WEDGE RESECTION (Right: Chest) INTERCOSTAL NERVE BLOCK (Right: Chest)  Patient Location: PACU  Anesthesia Type:General  Level of Consciousness: awake, alert , oriented, and patient cooperative  Airway & Oxygen Therapy: Patient Spontanous Breathing and Patient connected to face mask oxygen  Post-op Assessment: Report given to RN, Post -op Vital signs reviewed and stable, Patient moving all extremities, Patient moving all extremities X 4, and Patient able to stick tongue midline  Post vital signs: Reviewed and stable  Last Vitals:  Vitals Value Taken Time  BP 134/88 12/17/23 1510  Temp 36.6 C 12/17/23 1510  Pulse 98 12/17/23 1515  Resp 14 12/17/23 1515  SpO2 95 % 12/17/23 1515  Vitals shown include unfiled device data.  Last Pain:  Vitals:   12/17/23 1150  TempSrc:   PainSc: 8       Patients Stated Pain Goal: 3 (12/17/23 1150)  Complications: No notable events documented.

## 2023-12-17 NOTE — Progress Notes (Signed)
 PROGRESS NOTE  Wayne Rowe UJW:[id number] DOB: 05/13/1961 DOA: 12/11/2023 PCP: Elfredia Nevins, MD   LOS: 6 days   Brief narrative:   Wayne Rowe is a 63 y.o. male with medical history significant of diabetes, active tobacco use, COPD polyosteoarthritis, chronic pain, anxiety presented to hospital with right-sided stabbing chest pain deep with inspiration with worsening shortness of breath.  Patient is current smoker.  In the ED, patient was noted to have large right-sided pneumothorax and Pigtail chest tube was placed.  Labs were unremarkable but with slight troponin elevation.  Patient was then considered for admission to the hospital for further evaluation and treatment.  After hospitalization, patient has received chest tube placement with development of subcutaneous emphysema.  Had intractable pain and is currently on Dilaudid drip.  At this time patient has been seen by CT surgery and plan for surgical intervention this week.  12/17/2023: Patient underwent XI ROBOTIC ASSISTED THORACOSCOPY (Right), wedge Resection (Apical Blebectomy) Right Upper Lobe, apical Pleurectomy and mechanical Pleurodesis.  Patient has right-sided chest tube in place.  Not in any distress.  Assessment/Plan: Principal Problem:   Pneumothorax Active Problems:   Spontaneous pneumothorax   Protein-calorie malnutrition, severe   S/p Right Robotic Assisted Video Thoracoscopy with Apical Bleb Resection, Mechanical Pleurodesis  Right-sided pneumothorax, recurrent pneumothorax Severe chest pain. Subcutaneous emphysema Possibly secondary to bullous emphysema.  CT scan of the chest with bullous emphysema at the apices.  Chest tube was placed in and was managed by PCCM but subsequently came out.  On the evening 12/12/2023 patient had increased pneumothorax so needed chest tube placement again and was briefly on nonrebreather mask.  Patient has developed extensive subcutaneous emphysema.  Had intractable pain in did not  respond to IV Dilaudid push show was started on Dilaudid PCA since 12/14/2023 with significant relief.  Pulmonary following and recommended CT surgery evaluation.  At this time patient appears to be a good candidate for VATS ,bleb resection and pleurodesis likely this week by CT surgery. 12/17/2023: Cardiothoracic input is appreciated.  See above documentation.  Postop care as per cardiothoracic team.  COPD Continue DuoNebs, no overt wheezing.  On supplemental oxygen at 2.5 L.   Current tobacco abuse -- 1-2 PPD x 40 years.   -Continue nicotine patch.    Chronic pain syndrome Arthritis Degenerative disc disease  - Follows with pain clinic outpatient, continue Percocet and Toradol, Protonix.  High tolerance to narcotics.  Has been added on muscle relaxant as well.  Currently on Dilaudid pump.  Mild hyponatremia.  Will continue to monitor.  Sodium of 131 from initial 125.[name]  Could be secondary to pain.  Asymptomatic.  Mild leukocytosis.   -Likely reactive.   -Resolved.    Severe protein  calorie malnutrition.Body mass index is 16.49 kg/m.  Nutrition Status: Nutrition Problem: Severe Malnutrition Etiology: chronic illness Signs/Symptoms: severe muscle depletion, severe fat depletion Interventions: Refer to RD note for recommendations Continue nutritional supplements.  Courage to oral nutrition.    DVT prophylaxis: enoxaparin (LOVENOX) injection 40 mg Start: 12/11/23 2200   Disposition: Home, uncertain at this time, likely need surgical intervention.  Likely in 3 to 4 days.  Status is: Inpatient Remains inpatient appropriate because: Pneumothorax status post chest tube placement, pending clinical improvement, severe pain on Dilaudid drip,  need for VATS.    Code Status:     Code Status: Full Code  Family Communication: None  Consultants: PCCM Cardiothoracic surgery  Procedures: Right sided chest tube placement x2 XI ROBOTIC ASSISTED  THORACOSCOPY (Right) -Wedge Resection  (Apical Blebectomy) Right Upper Lobe -Apical Pleurectomy -Mechanical Pleurodesis  Anti-infectives:  None  Anti-infectives (From admission, onward)    None       Subjective: -Seen postop. -No new complaints.  Objective: Vitals:   12/17/23 1515 12/17/23 1530  BP: 130/83 (!) 130/91  Pulse: 98 98  Resp: 14 16  Temp:    SpO2: 95% 93%    Intake/Output Summary (Last 24 hours) at 12/17/2023 1550 Last data filed at 12/17/2023 1514 Gross per 24 hour  Intake 1388.4 ml  Output 3575 ml  Net -2186.6 ml   Filed Weights   12/11/23 0759 12/17/23 1140  Weight: 56.7 kg 56.7 kg   Body mass index is 16.49 kg/m.   Physical Exam:  GENERAL: Patient is thin.  Not in any distress.  Awake and alert.  Right-sided chest tube. HEENT: Mild pallor. Neck: Supple. Lungs: Decreased lung sounds right side. CVS: S1-S2. Neuro: Awake and alert.  Moves all extremities. Extremities: No leg edema.    Data Review: I have personally reviewed the following laboratory data and studies,  CBC: Recent Labs  Lab 12/11/23 0933 12/12/23 0625 12/13/23 0724 12/15/23 0456 12/16/23 0651  WBC 9.3 11.6* 9.8 7.8 7.5  NEUTROABS 7.1  --   --   --   --   HGB 15.6 13.3 12.4* 11.4* 11.5*  HCT 46.5 39.5 35.8* 34.2* 34.3*  MCV 91.0 89.8 88.4 90.0 90.5  PLT 398 335 293 304 326   Basic Metabolic Panel: Recent Labs  Lab 12/11/23 0933 12/12/23 0625 12/13/23 0724 12/15/23 0456 12/16/23 0651  NA 137 132* 125* 129* 131*  K 4.4 4.2 4.5 4.3 4.5  CL 100 97* 92* 96* 94*  CO2 27 27 24 27 29   GLUCOSE 111* 97 109* 103* 87  BUN 16 25* 23 20 23   CREATININE 0.93 1.09 1.24 1.07 1.04  CALCIUM 9.1 8.7* 8.5* 8.0* 8.5*  MG  --   --  1.9 2.0 2.0  PHOS  --   --   --  4.8* 4.1   Liver Function Tests: Recent Labs  Lab 12/12/23 0625  AST 20  ALT 18  ALKPHOS 55  BILITOT 0.6  PROT 6.0*  ALBUMIN 3.2*   No results for input(s): "LIPASE", "AMYLASE" in the last 168 hours. No results for input(s): "AMMONIA" in the  last 168 hours. Cardiac Enzymes: No results for input(s): "CKTOTAL", "CKMB", "CKMBINDEX", "TROPONINI" in the last 168 hours. BNP (last 3 results) No results for input(s): "BNP" in the last 8760 hours.  ProBNP (last 3 results) No results for input(s): "PROBNP" in the last 8760 hours.  CBG: No results for input(s): "GLUCAP" in the last 168 hours. Recent Results (from the past 240 hours)  Surgical pcr screen     Status: Abnormal   Collection Time: 12/17/23 11:53 AM   Specimen: Nasal Mucosa; Nasal Swab  Result Value Ref Range Status   MRSA, PCR POSITIVE (A) NEGATIVE Final    Comment: RESULT CALLED TO, READ BACK BY AND VERIFIED WITH: RN MARY T on [phone] @1426  by SM    Staphylococcus aureus POSITIVE (A) NEGATIVE Final    Comment: (NOTE) The Xpert SA Assay (FDA approved for NASAL specimens in patients 61 years of age and older), is one component of a comprehensive surveillance program. It is not intended to diagnose infection nor to guide or monitor treatment. Performed at [facility], [address], [place], [place] [address]      Studies: No  results found.   Time spent: 35 minutes.  Barnetta Chapel, MD  [facility] 12/17/2023  If 7PM-7AM, please contact night-coverage

## 2023-12-17 NOTE — Progress Notes (Signed)
 Retaj Hilbun,RN and Shainn,RN wasted 9ml of dilaudid PCA. Into Stericycle.

## 2023-12-17 NOTE — Progress Notes (Incomplete)
PROGRESS NOTE  Wayne Rowe:[id number] DOB: Sep 03, 1961 DOA: 12/11/2023 PCP: Elfredia Nevins, MD   LOS: 6 days   Brief narrative:   Wayne Rowe is a 63 y.o. male with medical history significant of diabetes, active tobacco use, COPD polyosteoarthritis, chronic pain, anxiety presented to hospital with right-sided stabbing chest pain deep with inspiration with worsening shortness of breath.  Patient is current smoker.  In the ED, patient was noted to have large right-sided pneumothorax and Pigtail chest tube was placed.  Labs were unremarkable but with slight troponin elevation.  Patient was then considered for admission to the hospital for further evaluation and treatment.  After hospitalization, patient has received chest tube placement with development of subcutaneous emphysema.  Had intractable pain and is currently on Dilaudid drip.  At this time patient has been seen by CT surgery and plan for surgical intervention this week.  Assessment/Plan: Principal Problem:   Pneumothorax Active Problems:   Spontaneous pneumothorax   Protein-calorie malnutrition, severe  Right-sided pneumothorax, recurrent pneumothorax Severe chest pain. Subcutaneous emphysema Possibly secondary to bullous emphysema.  CT scan of the chest with bullous emphysema at the apices.  Chest tube was placed in and was managed by PCCM but subsequently came out.  On the evening 12/12/2023 patient had increased pneumothorax so needed chest tube placement again and was briefly on nonrebreather mask.  Patient has developed extensive subcutaneous emphysema.  Had intractable pain in did not respond to IV Dilaudid push show was started on Dilaudid PCA since 12/14/2023 with significant relief.  Pulmonary following and recommended CT surgery evaluation.  At this time patient appears to be a good candidate for VATS ,bleb resection and pleurodesis likely this week by CT surgery.[name]  COPD Continue DuoNebs, no overt wheezing.  On  supplemental oxygen at 2.5 L.   Current tobacco abuse -- 1-2 PPD x 40 years.  Continue nicotine patch.    Chronic pain syndrome Arthritis Degenerative disc disease  - Follows with pain clinic outpatient, continue Percocet and Toradol, Protonix.  High tolerance to narcotics.  Has been added on muscle relaxant as well.  Currently on Dilaudid pump.  Mild hyponatremia.  Will continue to monitor.  Sodium of 131 from initial 125.[name]  Could be secondary to pain.  Asymptomatic.  Mild leukocytosis.  Likely reactive.  Resolved.    Severe protein  calorie malnutrition.Body mass index is 16.49 kg/m.  Nutrition Status: Nutrition Problem: Severe Malnutrition Etiology: chronic illness Signs/Symptoms: severe muscle depletion, severe fat depletion Interventions: Refer to RD note for recommendations Continue nutritional supplements.  Courage to oral nutrition.    DVT prophylaxis: enoxaparin (LOVENOX) injection 40 mg Start: 12/11/23 2200   Disposition: Home, uncertain at this time, likely need surgical intervention.  Likely in 3 to 4 days.  Status is: Inpatient Remains inpatient appropriate because: Pneumothorax status post chest tube placement, pending clinical improvement, severe pain on Dilaudid drip,  need for VATS.    Code Status:     Code Status: Full Code  Family Communication: None  Consultants: PCCM Cardiothoracic surgery  Procedures: Right sided chest tube placement x2  Anti-infectives:  None  Anti-infectives (From admission, onward)    None       Subjective: Today, patient was seen and examined at bedside.  Planes of mild nausea but no vomiting.  Pain is better controlled with Dilaudid drip.  Denies dyspnea cough congestion but has sharp pain in the chest on taking a deep breath.  Objective: Vitals:   12/17/23 1140 12/17/23 1200  BP: 130/79 119/74  Pulse: (!) 104 96  Resp: 18 (!) 23  Temp: 98.6 F (37 C)   SpO2: (!) 86% 91%    Intake/Output Summary (Last 24  hours) at 12/17/2023 1342 Last data filed at 12/17/2023 1100 Gross per 24 hour  Intake 88.4 ml  Output 3575 ml  Net -3486.6 ml   Filed Weights   12/11/23 0759 12/17/23 1140  Weight: 56.7 kg 56.7 kg   Body mass index is 16.49 kg/m.   Physical Exam:  GENERAL: Patient is alert awake and oriented. Not in obvious distress.  Thinly built.  On nasal cannula oxygen appears chronically ill.  On supplemental oxygen, subcutaneous emphysema noted over the right chest wall arm and shoulder area. HENT: No scleral pallor or icterus. Pupils equally reactive to light. Oral mucosa is moist NECK: is supple, no gross swelling noted. CHEST: Diminished breath sounds mostly on the right side with chest tube in place.   CVS: S1 and S2 heard, no murmur. Regular rate and rhythm.  ABDOMEN: Soft, non-tender, bowel sounds are present. EXTREMITIES: No edema.  Thin extremities CNS: Cranial nerves are intact. No focal motor deficits. SKIN: warm and dry without rashes.  Data Review: I have personally reviewed the following laboratory data and studies,  CBC: Recent Labs  Lab 12/11/23 0933 12/12/23 0625 12/13/23 0724 12/15/23 0456 12/16/23 0651  WBC 9.3 11.6* 9.8 7.8 7.5  NEUTROABS 7.1  --   --   --   --   HGB 15.6 13.3 12.4* 11.4* 11.5*  HCT 46.5 39.5 35.8* 34.2* 34.3*  MCV 91.0 89.8 88.4 90.0 90.5  PLT 398 335 293 304 326   Basic Metabolic Panel: Recent Labs  Lab 12/11/23 0933 12/12/23 0625 12/13/23 0724 12/15/23 0456 12/16/23 0651  NA 137 132* 125* 129* 131*  K 4.4 4.2 4.5 4.3 4.5  CL 100 97* 92* 96* 94*  CO2 27 27 24 27 29   GLUCOSE 111* 97 109* 103* 87  BUN 16 25* 23 20 23   CREATININE 0.93 1.09 1.24 1.07 1.04  CALCIUM 9.1 8.7* 8.5* 8.0* 8.5*  MG  --   --  1.9 2.0 2.0  PHOS  --   --   --  4.8* 4.1   Liver Function Tests: Recent Labs  Lab 12/12/23 0625  AST 20  ALT 18  ALKPHOS 55  BILITOT 0.6  PROT 6.0*  ALBUMIN 3.2*   No results for input(s): "LIPASE", "AMYLASE" in the last 168  hours. No results for input(s): "AMMONIA" in the last 168 hours. Cardiac Enzymes: No results for input(s): "CKTOTAL", "CKMB", "CKMBINDEX", "TROPONINI" in the last 168 hours. BNP (last 3 results) No results for input(s): "BNP" in the last 8760 hours.  ProBNP (last 3 results) No results for input(s): "PROBNP" in the last 8760 hours.  CBG: No results for input(s): "GLUCAP" in the last 168 hours. No results found for this or any previous visit (from the past 240 hours).   Studies: No results found.     Barnetta Chapel, MD  [facility] 12/17/2023  If 7PM-7AM, please contact night-coverage

## 2023-12-17 NOTE — Anesthesia Postprocedure Evaluation (Signed)
 Anesthesia Post Note  Patient: Wayne Rowe  Procedure(s) Performed: XI ROBOTIC ASSISTED THORACOSCOPY-WEDGE RESECTION (Right: Chest) INTERCOSTAL NERVE BLOCK (Right: Chest)     Patient location during evaluation: PACU Anesthesia Type: General Level of consciousness: awake and alert, oriented and patient cooperative Pain management: pain level controlled Vital Signs Assessment: post-procedure vital signs reviewed and stable Respiratory status: spontaneous breathing, nonlabored ventilation and respiratory function stable Cardiovascular status: blood pressure returned to baseline and stable Postop Assessment: no apparent nausea or vomiting Anesthetic complications: no   No notable events documented.  Last Vitals:  Vitals:   12/17/23 1515 12/17/23 1530  BP: 130/83 (!) 130/91  Pulse: 98 98  Resp: 14 16  Temp:    SpO2: 95% 93%    Last Pain:  Vitals:   12/17/23 1510  TempSrc:   PainSc: 0-No pain                 Lannie Fields

## 2023-12-17 NOTE — Anesthesia Procedure Notes (Signed)
 Procedure Name: Intubation Date/Time: 12/17/2023 1:55 PM  Performed by: Venia Carbon, CRNAPre-anesthesia Checklist: Patient identified, Emergency Drugs available, Suction available, Patient being monitored and Timeout performed Patient Re-evaluated:Patient Re-evaluated prior to induction Oxygen Delivery Method: Circle system utilized Preoxygenation: Pre-oxygenation with 100% oxygen Induction Type: IV induction Laryngoscope Size: Mac and 3 Grade View: Grade I Endobronchial tube: Double lumen EBT, EBT position confirmed by auscultation and EBT position confirmed by fiberoptic bronchoscope and 37 Fr Number of attempts: 1 Airway Equipment and Method: Patient positioned with wedge pillow and Stylet Placement Confirmation: ETT inserted through vocal cords under direct vision, positive ETCO2, CO2 detector and breath sounds checked- equal and bilateral Tube secured with: Tape

## 2023-12-17 NOTE — Progress Notes (Signed)
     [address]       [place] [address]             [phone]       No events  Vitals:   12/17/23 0503 12/17/23 0744  BP: 113/70   Pulse: 94 87  Resp: 16 18  Temp: 98.4 F (36.9 C)   SpO2: 94% 94%   Alert NAD Sinus  EWOB  OR today for R RATS, wedge resection Duaa Stelzner O Azula Zappia

## 2023-12-17 NOTE — Anesthesia Preprocedure Evaluation (Addendum)
 Anesthesia Evaluation  Patient identified by MRN, date of birth, ID band Patient awake    Reviewed: Allergy & Precautions, NPO status , Patient's Chart, lab work & pertinent test results  Airway Mallampati: III  TM Distance: >3 FB Neck ROM: Full    Dental  (+) Edentulous Upper, Edentulous Lower   Pulmonary asthma , COPD,  COPD inhaler, Current Smoker and Patient abstained from smoking. Spontaneous pneumothorax- Possibly secondary to bullous emphysema.  CT scan of the chest with bullous emphysema at the apices.  Chest tube was placed in and was managed by PCCM but subsequently came out.  On the evening 12/12/2023 patient had increased pneumothorax so needed chest tube placement again and was briefly on nonrebreather mask.  Patient has developed extensive subcutaneous emphysema.  40 pack year history, currently on 2LPM [place]    Pulmonary exam normal breath sounds clear to auscultation       Cardiovascular negative cardio ROS Normal cardiovascular exam Rhythm:Regular Rate:Normal     Neuro/Psych  PSYCHIATRIC DISORDERS Anxiety     negative neurological ROS     GI/Hepatic negative GI ROS, Neg liver ROS,,,  Endo/Other  negative endocrine ROS    Renal/GU negative Renal ROS  negative genitourinary   Musculoskeletal  (+) Arthritis , Osteoarthritis,    Abdominal   Peds  Hematology  (+) Blood dyscrasia, anemia Hb 11.5, plt 326   Anesthesia Other Findings   Reproductive/Obstetrics negative OB ROS                             Anesthesia Physical Anesthesia Plan  ASA: 3  Anesthesia Plan: General   Post-op Pain Management: Tylenol PO (pre-op)*   Induction: Intravenous  PONV Risk Score and Plan: 1 and Ondansetron, Dexamethasone, Midazolam and Treatment may vary due to age or medical condition  Airway Management Planned: Double Lumen EBT  Additional Equipment: ClearSight  Intra-op Plan:    Post-operative Plan: Extubation in OR  Informed Consent: I have reviewed the patients History and Physical, chart, labs and discussed the procedure including the risks, benefits and alternatives for the proposed anesthesia with the patient or authorized representative who has indicated his/her understanding and acceptance.     Dental advisory given  Plan Discussed with: CRNA  Anesthesia Plan Comments:        Anesthesia Quick Evaluation

## 2023-12-17 NOTE — Brief Op Note (Signed)
 12/11/2023 - 12/17/2023  2:51 PM  PATIENT:  Wayne Rowe  63 y.o. male  PRE-OPERATIVE DIAGNOSIS:  spontaneous pneumothorax  POST-OPERATIVE DIAGNOSIS:  spontaneous pneumothorax  PROCEDURE:  Procedure(s):  XI ROBOTIC ASSISTED THORACOSCOPY (Right) -Wedge Resection (Apical Blebectomy) Right Upper Lobe -Apical Pleurectomy -Mechanical Pleurodesis  INTERCOSTAL NERVE BLOCK  SURGEON:  Surgeons and Role:    * Lightfoot, Eliezer Lofts, MD - Primary  PHYSICIAN ASSISTANT: Lowella Dandy PA-C  ASSISTANTS: none   ANESTHESIA:   general  EBL: 10 ml  BLOOD ADMINISTERED:none  DRAINS:  28 Straight Chest Tube Right Chest    LOCAL MEDICATIONS USED:  BUPIVICAINE   SPECIMEN:  Source of Specimen:  Right Upper Lobe Wedge (Blebs)  DISPOSITION OF SPECIMEN:  PATHOLOGY  COUNTS:  YES  TOURNIQUET:  * No tourniquets in log *  DICTATION: .Dragon Dictation  PLAN OF CARE:  Patient has active admission order  PATIENT DISPOSITION:  PACU - hemodynamically stable.   Delay start of Pharmacological VTE agent (>24hrs) due to surgical blood loss or risk of bleeding: no

## 2023-12-17 NOTE — Plan of Care (Signed)
   Problem: Education: Goal: Knowledge of General Education information will improve Description Including pain rating scale, medication(s)/side effects and non-pharmacologic comfort measures Outcome: Progressing   Problem: Health Behavior/Discharge Planning: Goal: Ability to manage health-related needs will improve Outcome: Progressing   Problem: Clinical Measurements: Goal: Will remain free from infection Outcome: Progressing Goal: Respiratory complications will improve Outcome: Progressing Goal: Cardiovascular complication will be avoided Outcome: Progressing   Problem: Activity: Goal: Risk for activity intolerance will decrease Outcome: Progressing   Problem: Nutrition: Goal: Adequate nutrition will be maintained Outcome: Progressing

## 2023-12-17 NOTE — Progress Notes (Signed)
 Patient arrived at at the unit from PACU,chg bath given,vitals taken,CCMD notified ,incision site on rt chest level 0,chest tube present in rt chest under water seal,pt oriented to the unit,call bell in reach

## 2023-12-17 NOTE — Op Note (Signed)
 [address]       [place] [address]             [phone]        12/17/2023  Patient:  Wayne Rowe Pre-Op Dx: Right spontaneous pneumothorax Bullous emphysema   Post-op Dx:  same Procedure: - Robotic assisted right video thoracoscopy - Wedge resection of the right upper lobe - Apical pleurectomy - Mechanical pleurodesis - Intercostal nerve block  Surgeon and Role:      * Corliss Skains, MD - Primary Assistant: D.Zimmerman, PA-C An experienced assistant was required given the complexity of this surgery and the standard of surgical care. The assistant was needed for exposure, dissection, suctioning, retraction of delicate tissues and sutures, instrument exchange and for overall help during this procedure.  Anesthesia  general EBL:  10ml Blood Administration: none Specimen:  Wedge resection of the right upper lobe.  Apical pleura.  Drains: 88 F argyle chest tube in right chest Counts: correct   Indications: We are asked to see this 63 year old male in cardiothoracic surgical consultation for consideration of surgical intervention for right-sided spontaneous pneumothorax. The patient's past medical history is significant for diabetes, active tobacco use, polyarthritis, chronic pain and anxiety. He presented to the emergency room with right-sided chest pain that was noted to be worse with inspiration. He was described as stabbing in nature. Chest x-ray in the ED revealed a large right-sided pneumothorax and a pigtail chest tube was placed. Most recent x-ray on today's date shows a small right pneumothorax that has decreased in size. There is also bilateral atelectasis. He was also significant subcutaneous emphysema. A CT scan done 12/11/2023 showed scattered groundglass opacities throughout the right lung which may reflect reexpansion edema or residual hypoventilatory changes. Also of significance is bullous emphysema which is noted to be greatest at the  apices. His tobacco use has been for 40 years and he has known COPD on nebs. He is not on home oxygen. He is followed by a pain clinic for his chronic pain syndrome primarily associated with degenerative disc disease. We are asked to see the patient for consideration of surgical intervention to include bleb stapling and pleurodesis.   Findings: Dominant apical blebs  Operative Technique: After the risks, benefits and alternatives were thoroughly discussed, the patient was brought to the operative theatre.  Anesthesia was induced, and the patient was then placed in a left lateral decubitus position and was prepped and draped in normal sterile fashion.  An appropriate surgical pause was performed, and pre-operative antibiotics were dosed accordingly.  We began by placing our 3 robotic ports in the 7th intercostal space targeting the hilum of the lung.  The robot was then docked and all instruments were passed under direct visualization.    The lung was then retracted superiorly, and the inferior pulmonary ligament was divided.  The lung was freed of all pleural adhesions.  Wedge resections of the right upper lobe was performed.  The apical parietal pleural was removed with a combination of cautery and blunt dissection.  The chest was irrigated, and an air leak test was performed.  An intercostal nerve block was performed under direct visualization.  A mechanical pleurodesis was then performed.  A 43F chest with then placed, and we watch the remaining lobes re-expand.  The skin and soft tissue were closed with absorbable suture.    The patient tolerated the procedure without any immediate complications, and was transferred to the PACU  in stable condition.  Domino Holten Keane Scrape

## 2023-12-17 NOTE — Discharge Instructions (Addendum)
 Discharge Instructions:  1. You may shower, please wash incisions daily with soap and water and keep dry.  If you wish to cover wounds with dressing you may do so but please keep clean and change daily.  No tub baths or swimming until incisions have completely healed.  If your incisions become red or develop any drainage please call our office at [phone]  2. No Driving until cleared by Dr. Alfonse Alpers office and you are no longer using narcotic pain medications  3. Fever of 101.5 for at least 24 hours with no source, please contact our office at [phone]  4. Activity- up as tolerated, please walk at least 3 times per day.  Avoid strenuous activity,   5. If any questions or concerns arise, please do not hesitate to contact our office at [phone]

## 2023-12-18 ENCOUNTER — Encounter (HOSPITAL_COMMUNITY): Payer: Self-pay | Admitting: Thoracic Surgery (Cardiothoracic Vascular Surgery)

## 2023-12-18 ENCOUNTER — Inpatient Hospital Stay (HOSPITAL_COMMUNITY): Payer: 59

## 2023-12-18 DIAGNOSIS — J9383 Other pneumothorax: Secondary | ICD-10-CM | POA: Diagnosis not present

## 2023-12-18 LAB — RENAL FUNCTION PANEL
Albumin: 2.6 g/dL — ABNORMAL LOW (ref 3.5–5.0)
Anion gap: 7 (ref 5–15)
BUN: 28 mg/dL — ABNORMAL HIGH (ref 8–23)
CO2: 30 mmol/L (ref 22–32)
Calcium: 8.2 mg/dL — ABNORMAL LOW (ref 8.9–10.3)
Chloride: 97 mmol/L — ABNORMAL LOW (ref 98–111)
Creatinine, Ser: 1.18 mg/dL (ref 0.61–1.24)
GFR, Estimated: >60 mL/min (ref >60)
Glucose, Bld: 144 mg/dL — ABNORMAL HIGH (ref 70–99)
Phosphorus: 3.6 mg/dL (ref 2.5–4.6)
Potassium: 4.3 mmol/L (ref 3.5–5.1)
Sodium: 134 mmol/L — ABNORMAL LOW (ref 135–145)

## 2023-12-18 LAB — BASIC METABOLIC PANEL
Anion gap: 8 (ref 5–15)
BUN: 27 mg/dL — ABNORMAL HIGH (ref 8–23)
CO2: 29 mmol/L (ref 22–32)
Calcium: 8.2 mg/dL — ABNORMAL LOW (ref 8.9–10.3)
Chloride: 98 mmol/L (ref 98–111)
Creatinine, Ser: 1.17 mg/dL (ref 0.61–1.24)
GFR, Estimated: >60 mL/min (ref >60)
Glucose, Bld: 143 mg/dL — ABNORMAL HIGH (ref 70–99)
Potassium: 4.3 mmol/L (ref 3.5–5.1)
Sodium: 135 mmol/L (ref 135–145)

## 2023-12-18 LAB — CBC WITH DIFFERENTIAL/PLATELET
Abs Immature Granulocytes: 0.06 10*3/uL (ref 0.00–0.07)
Basophils Absolute: 0 10*3/uL (ref 0.0–0.1)
Basophils Relative: 0 %
Eosinophils Absolute: 0.3 10*3/uL (ref 0.0–0.5)
Eosinophils Relative: 2 %
HCT: 33 % — ABNORMAL LOW (ref 39.0–52.0)
Hemoglobin: 11 g/dL — ABNORMAL LOW (ref 13.0–17.0)
Immature Granulocytes: 1 %
Lymphocytes Relative: 10 %
Lymphs Abs: 1.2 10*3/uL (ref 0.7–4.0)
MCH: 30.1 pg (ref 26.0–34.0)
MCHC: 33.3 g/dL (ref 30.0–36.0)
MCV: 90.4 fL (ref 80.0–100.0)
Monocytes Absolute: 1.5 10*3/uL — ABNORMAL HIGH (ref 0.1–1.0)
Monocytes Relative: 13 %
Neutro Abs: 8.3 10*3/uL — ABNORMAL HIGH (ref 1.7–7.7)
Neutrophils Relative %: 74 %
Platelets: 385 10*3/uL (ref 150–400)
RBC: 3.65 MIL/uL — ABNORMAL LOW (ref 4.22–5.81)
RDW: 11.9 % (ref 11.5–15.5)
WBC: 11.3 10*3/uL — ABNORMAL HIGH (ref 4.0–10.5)
nRBC: 0 % (ref 0.0–0.2)

## 2023-12-18 LAB — MAGNESIUM: Magnesium: 2.1 mg/dL (ref 1.7–2.4)

## 2023-12-18 MED ORDER — KETOROLAC TROMETHAMINE 15 MG/ML IJ SOLN
15.0000 mg | Freq: Four times a day (QID) | INTRAMUSCULAR | Status: AC
  Administered 2023-12-18 (×2): 15 mg via INTRAVENOUS
  Filled 2023-12-18 (×2): qty 1

## 2023-12-18 MED ORDER — CHLORHEXIDINE GLUCONATE CLOTH 2 % EX PADS
6.0000 | MEDICATED_PAD | Freq: Every day | CUTANEOUS | Status: AC
Start: 2023-12-18 — End: 2023-12-23
  Administered 2023-12-18 – 2023-12-20 (×3): 6 via TOPICAL

## 2023-12-18 MED ORDER — MUPIROCIN 2 % EX OINT
1.0000 | TOPICAL_OINTMENT | Freq: Two times a day (BID) | CUTANEOUS | Status: DC
  Administered 2023-12-18 – 2023-12-20 (×5): 1 via NASAL
  Filled 2023-12-18 (×2): qty 22

## 2023-12-18 MED ORDER — OXYCODONE HCL 5 MG PO TABS
10.0000 mg | ORAL_TABLET | ORAL | Status: DC | PRN
  Administered 2023-12-18 – 2023-12-20 (×13): 10 mg via ORAL
  Filled 2023-12-18 (×13): qty 2

## 2023-12-18 NOTE — Progress Notes (Signed)
 PROGRESS NOTE  Wayne Rowe ZOX:[id number] DOB: 19-Oct-1961 DOA: 12/11/2023 PCP: Elfredia Nevins, MD   LOS: 7 days   Brief narrative: Patient is a 63 year old male, with past medical history significant for diabetes, active tobacco use, COPD polyosteoarthritis, chronic pain, and anxiety.  Patient presented to hospital with right-sided stabbing chest pain deep with inspiration with worsening shortness of breath.  Patient is current smoker.  In the ED, patient was noted to have large right-sided pneumothorax and Pigtail chest tube was placed.    12/17/2023: Patient underwent XI ROBOTIC ASSISTED THORACOSCOPY (Right), wedge Resection (Apical Blebectomy) Right Upper Lobe, apical Pleurectomy and mechanical Pleurodesis.  Patient has right-sided chest tube in place.  Not in any distress.  12/18/2023: Patient seen.  No new complaints.  Cardiothoracic surgery input is highly appreciated.  Cardiothoracic team plans to apply suction to the chest tube.  Assessment/Plan: Principal Problem:   Pneumothorax Active Problems:   Spontaneous pneumothorax   Protein-calorie malnutrition, severe   S/p Right Robotic Assisted Video Thoracoscopy with Apical Bleb Resection, Mechanical Pleurodesis  Right-sided pneumothorax, recurrent pneumothorax Severe chest pain. Subcutaneous emphysema Possibly secondary to bullous emphysema.  CT scan of the chest with bullous emphysema at the apices.  Chest tube was placed in and was managed by PCCM but subsequently came out.  On the evening 12/12/2023 patient had increased pneumothorax so needed chest tube placement again and was briefly on nonrebreather mask.  Patient has developed extensive subcutaneous emphysema.  Had intractable pain in did not respond to IV Dilaudid push show was started on Dilaudid PCA since 12/14/2023 with significant relief.  Pulmonary following and recommended CT surgery evaluation.  At this time patient appears to be a good candidate for VATS ,bleb resection  and pleurodesis likely this week by CT surgery.  12/17/2023: Cardiothoracic input is appreciated.  See above documentation.  Postop care as per cardiothoracic team. 12/18/2023: Cardiothoracic team plans of laceration to the chest tube.  Patient remained stable.  COPD Continue DuoNebs, no overt wheezing.  On supplemental oxygen at 2.5 L.   Current tobacco abuse -- 1-2 PPD x 40 years.   -Continue nicotine patch.    Chronic pain syndrome Arthritis Degenerative disc disease  - Follows with pain clinic outpatient, continue Percocet and Toradol, Protonix.  High tolerance to narcotics.  Has been added on muscle relaxant as well.  Currently on Dilaudid pump.  Mild hyponatremia.  Will continue to monitor.  Sodium of 131 from initial 125.[name]  Could be secondary to pain.  Asymptomatic.  Mild leukocytosis.   -Likely reactive.   -Resolved.    Severe protein  calorie malnutrition.Body mass index is 16.49 kg/m.  Nutrition Status: Nutrition Problem: Severe Malnutrition Etiology: chronic illness Signs/Symptoms: severe muscle depletion, severe fat depletion Interventions: Refer to RD note for recommendations Continue nutritional supplements.  Courage to oral nutrition.    DVT prophylaxis: SCD's Start: 12/17/23 1816 enoxaparin (LOVENOX) injection 40 mg Start: 12/11/23 2200   Disposition: Home, uncertain at this time, likely need surgical intervention.  Likely in 3 to 4 days.  Status is: Inpatient Remains inpatient appropriate because: Pneumothorax status post chest tube placement, pending clinical improvement, severe pain on Dilaudid drip,  need for VATS.    Code Status:     Code Status: Full Code  Family Communication: None  Consultants: PCCM Cardiothoracic surgery  Procedures: Right sided chest tube placement x2 XI ROBOTIC ASSISTED THORACOSCOPY (Right) -Wedge Resection (Apical Blebectomy) Right Upper Lobe -Apical Pleurectomy -Mechanical  Pleurodesis  Anti-infectives:  None  Anti-infectives (  From admission, onward)    Start     Dose/Rate Route Frequency Ordered Stop   12/17/23 2200  ceFAZolin (ANCEF) IVPB 2g/100 mL premix        2 g 200 mL/hr over 30 Minutes Intravenous Every 8 hours 12/17/23 1728 12/18/23 0645       Subjective: -Seen postop. -No new complaints.  Objective: Vitals:   12/18/23 1606 12/18/23 1700  BP: 130/71 120/71  Pulse: 88 94  Resp: 20   Temp: 98.7 F (37.1 C)   SpO2: 92% 96%    Intake/Output Summary (Last 24 hours) at 12/18/2023 1934 Last data filed at 12/18/2023 1800 Gross per 24 hour  Intake --  Output 1355 ml  Net -1355 ml   Filed Weights   12/11/23 0759 12/17/23 1140  Weight: 56.7 kg 56.7 kg   Body mass index is 16.49 kg/m.   Physical Exam:  GENERAL: Patient is thin.  Not in any distress.  Awake and alert.  Right-sided chest tube. HEENT: Mild pallor. Neck: Supple. Lungs: Decreased lung sounds right side. CVS: S1-S2. Neuro: Awake and alert.  Moves all extremities. Extremities: No leg edema.    Data Review: I have personally reviewed the following laboratory data and studies,  CBC: Recent Labs  Lab 12/12/23 0625 12/13/23 0724 12/15/23 0456 12/16/23 0651 12/18/23 0330  WBC 11.6* 9.8 7.8 7.5 11.3*  NEUTROABS  --   --   --   --  8.3*  HGB 13.3 12.4* 11.4* 11.5* 11.0*  HCT 39.5 35.8* 34.2* 34.3* 33.0*  MCV 89.8 88.4 90.0 90.5 90.4  PLT 335 293 304 326 385   Basic Metabolic Panel: Recent Labs  Lab 12/13/23 0724 12/15/23 0456 12/16/23 0651 12/18/23 0330 12/18/23 0331  NA 125* 129* 131* 135 134*  K 4.5 4.3 4.5 4.3 4.3  CL 92* 96* 94* 98 97*  CO2 24 27 29 29 30   GLUCOSE 109* 103* 87 143* 144*  BUN 23 20 23  27* 28*  CREATININE 1.24 1.07 1.04 1.17 1.18  CALCIUM 8.5* 8.0* 8.5* 8.2* 8.2*  MG 1.9 2.0 2.0 2.1  --   PHOS  --  4.8* 4.1  --  3.6   Liver Function Tests: Recent Labs  Lab 12/12/23 0625 12/18/23 0331  AST 20  --   ALT 18  --   ALKPHOS 55   --   BILITOT 0.6  --   PROT 6.0*  --   ALBUMIN 3.2* 2.6*   No results for input(s): "LIPASE", "AMYLASE" in the last 168 hours. No results for input(s): "AMMONIA" in the last 168 hours. Cardiac Enzymes: No results for input(s): "CKTOTAL", "CKMB", "CKMBINDEX", "TROPONINI" in the last 168 hours. BNP (last 3 results) No results for input(s): "BNP" in the last 8760 hours.  ProBNP (last 3 results) No results for input(s): "PROBNP" in the last 8760 hours.  CBG: No results for input(s): "GLUCAP" in the last 168 hours. Recent Results (from the past 240 hours)  Surgical pcr screen     Status: Abnormal   Collection Time: 12/17/23 11:53 AM   Specimen: Nasal Mucosa; Nasal Swab  Result Value Ref Range Status   MRSA, PCR POSITIVE (A) NEGATIVE Final    Comment: RESULT CALLED TO, READ BACK BY AND VERIFIED WITH: RN MARY T on [phone] @1426  by SM    Staphylococcus aureus POSITIVE (A) NEGATIVE Final    Comment: (NOTE) The Xpert SA Assay (FDA approved for NASAL specimens in patients 16 years of age and older), is one component of  a comprehensive surveillance program. It is not intended to diagnose infection nor to guide or monitor treatment. Performed at [facility], [address], [place], [place] [address]      Studies: DG Chest Port 1 View Result Date: 12/18/2023 CLINICAL DATA:  Status post lung surgery.  Follow up pneumothorax. EXAM: PORTABLE CHEST 1 VIEW COMPARISON:  Radiographs 12/17/2023, 12/15/2023 and 12/14/2023. Chest CT 12/11/2023. FINDINGS: 0538 hours. The right chest tube is unchanged, tip extending to the right lung apex. No residual pneumothorax identified. Chain staples at the right lung apex are noted. There is minimal atelectasis at both lung bases. The heart size and mediastinal contours are stable. Moderate soft tissue emphysema throughout the right chest wall again noted. IMPRESSION: 1. No residual pneumothorax identified. Right chest tube in place with similar chest  wall soft tissue emphysema. 2. Minimal bibasilar atelectasis. Electronically Signed   By: Carey Bullocks M.D.   On: 12/18/2023 10:14   DG Chest Port 1 View Result Date: 12/17/2023 CLINICAL DATA:  Right lung surgery, pneumothorax EXAM: PORTABLE CHEST 1 VIEW COMPARISON:  12/15/2023 FINDINGS: Single frontal view of the chest demonstrates interval removal of the right pigtail drainage catheter, with interval placement of a large-bore right chest tube with tip projecting over the right apex. Cardiac silhouette is stable. Change staples are seen at the right apex. No airspace disease or effusion. There is extensive subcutaneous gas throughout the right chest wall, limiting evaluation for underlying pneumothorax. I do not see a pneumothorax on this exam however. No acute bony abnormalities. IMPRESSION: 1. Interval replacement of a right-sided pigtail drainage catheter with a large bore chest tube as above. No evidence of recurrent or residual right-sided pneumothorax. 2. Extensive subcutaneous emphysema within the right chest wall, stable. Electronically Signed   By: Sharlet Salina M.D.   On: 12/17/2023 19:21     Time spent: 35 minutes.  Barnetta Chapel, MD  [facility] 12/18/2023  If 7PM-7AM, please contact night-coverage

## 2023-12-18 NOTE — Plan of Care (Signed)

## 2023-12-18 NOTE — Progress Notes (Signed)
 Bloody drainage noted around insertion site of chest tube.  2x2 saturated.  SQ air noted on right side.  Dr. Leafy Ro with CT surgery notified.  Recommended to keep compression bandage on and team will assess in am.  Patient is in no acute respiratory distress.  VSS.  Chest tube dressing changed.  Pain medication given

## 2023-12-18 NOTE — Progress Notes (Addendum)
      [address]       [place] [address]             [phone]       1 Day Post-Op Procedure(s) (LRB): XI ROBOTIC ASSISTED THORACOSCOPY-WEDGE RESECTION (Right) INTERCOSTAL NERVE BLOCK (Right)  Subjective: Patient complaining of sharp, stabbing pain right chest. He states he has chronic pain (takes Oxy every 3 hours and Soma). He also states his belly is swollen;denies significant pain, nausea, and has had a bowel movement yesterday  Objective: Vital signs in last 24 hours: Temp:  [97.8 F (36.6 C)-99.3 F (37.4 C)] 99.3 F (37.4 C) (02/28 0408) Pulse Rate:  [87-107] 107 (02/28 0408) Cardiac Rhythm: Sinus tachycardia (02/27 1908) Resp:  [14-24] 24 (02/28 0408) BP: (100-140)/(65-91) 116/67 (02/28 0408) SpO2:  [86 %-100 %] 97 % (02/28 0408) Weight:  [56.7 kg] 56.7 kg (02/27 1140)     Intake/Output from previous day: 02/27 0701 - 02/28 0700 In: 1300 [I.V.:1000; IV Piggyback:300] Out: 1030 [Urine:1000; Chest Tube:30]   Physical Exam:  Cardiovascular: RRR Pulmonary: Coarse on right (sub Q air, chest tube), clear on left Abdomen: Semi soft, distended, occasional bowel sounds Extremities: SCDs. Wounds: Clean and dry.  No erythema or signs of infection. Chest Tube: to water seal, no air leak (tidling with cough)  Lab Results: CBC: Recent Labs    12/16/23 0651 12/18/23 0330  WBC 7.5 11.3*  HGB 11.5* 11.0*  HCT 34.3* 33.0*  PLT 326 385   BMET:  Recent Labs    12/18/23 0330 12/18/23 0331  NA 135 134*  K 4.3 4.3  CL 98 97*  CO2 29 30  GLUCOSE 143* 144*  BUN 27* 28*  CREATININE 1.17 1.18  CALCIUM 8.2* 8.2*    PT/INR: No results for input(s): "LABPROT", "INR" in the last 72 hours. ABG:  INR: Will add last result for INR, ABG once components are confirmed Will add last 4 CBG results once components are confirmed  Assessment/Plan:  1. CV - ST at times (likely related to uncontrolled pain) 2.  Pulmonary - History of COPD;continue  nebs. On room air this am. Chest tube with 30 cc since surgery. Chest tube is to water seal;will place to suction. CXR this am appears stable (decreasing subcutaneous emphysema right lateral chest wall). Encourage incentive spirometer 3. Mild hyponatremia-sodium 134 4. Anemia-H and H stable at 11 and 33.  5. Regarding pain control, on Morphine PRN, Soma 350 mg tid, Toradol 15 mg PRN, Oxy 5-10 mg Q 4 hours PRN. Will schedule Toradol to help with pain and give Oxy 10 mg every 4 hours PRN. Will defer to medicine if should schedule instead of PRN (as taken this way prior to surgery). 6. On Lovenox for DVT prophylaxis  Donielle M ZimmermanPA-C 12/18/2023,6:56 AM  Agree with above Continue CT to suction  Nykerria Macconnell O Lynell Greenhouse

## 2023-12-19 ENCOUNTER — Inpatient Hospital Stay (HOSPITAL_COMMUNITY)

## 2023-12-19 DIAGNOSIS — J9383 Other pneumothorax: Secondary | ICD-10-CM | POA: Diagnosis not present

## 2023-12-19 LAB — COMPREHENSIVE METABOLIC PANEL
ALT: 28 U/L (ref 0–44)
AST: 34 U/L (ref 15–41)
Albumin: 2.8 g/dL — ABNORMAL LOW (ref 3.5–5.0)
Alkaline Phosphatase: 92 U/L (ref 38–126)
Anion gap: 12 (ref 5–15)
BUN: 17 mg/dL (ref 8–23)
CO2: 26 mmol/L (ref 22–32)
Calcium: 9 mg/dL (ref 8.9–10.3)
Chloride: 97 mmol/L — ABNORMAL LOW (ref 98–111)
Creatinine, Ser: 0.88 mg/dL (ref 0.61–1.24)
GFR, Estimated: >60 mL/min (ref >60)
Glucose, Bld: 139 mg/dL — ABNORMAL HIGH (ref 70–99)
Potassium: 3.9 mmol/L (ref 3.5–5.1)
Sodium: 135 mmol/L (ref 135–145)
Total Bilirubin: 0.4 mg/dL (ref 0.0–1.2)
Total Protein: 6.4 g/dL — ABNORMAL LOW (ref 6.5–8.1)

## 2023-12-19 LAB — CBC
HCT: 36.8 % — ABNORMAL LOW (ref 39.0–52.0)
Hemoglobin: 12.3 g/dL — ABNORMAL LOW (ref 13.0–17.0)
MCH: 30.1 pg (ref 26.0–34.0)
MCHC: 33.4 g/dL (ref 30.0–36.0)
MCV: 90 fL (ref 80.0–100.0)
Platelets: 451 10*3/uL — ABNORMAL HIGH (ref 150–400)
RBC: 4.09 MIL/uL — ABNORMAL LOW (ref 4.22–5.81)
RDW: 12.2 % (ref 11.5–15.5)
WBC: 11.9 10*3/uL — ABNORMAL HIGH (ref 4.0–10.5)
nRBC: 0 % (ref 0.0–0.2)

## 2023-12-19 NOTE — Progress Notes (Signed)
 PROGRESS NOTE  Wayne Rowe ZOX:[id number] DOB: 1961/03/30 DOA: 12/11/2023 PCP: Elfredia Nevins, MD   LOS: 8 days   Brief narrative: Patient is a 63 year old male, with past medical history significant for diabetes, active tobacco use, COPD polyosteoarthritis, chronic pain, and anxiety.  Patient presented to hospital with right-sided stabbing chest pain deep with inspiration with worsening shortness of breath.  Patient is current smoker.  In the ED, patient was noted to have large right-sided pneumothorax and Pigtail chest tube was placed.    12/17/2023: Patient underwent XI ROBOTIC ASSISTED THORACOSCOPY (Right), wedge Resection (Apical Blebectomy) Right Upper Lobe, apical Pleurectomy and mechanical Pleurodesis.  Patient has right-sided chest tube in place.  Not in any distress.  12/18/2023: Patient seen.  No new complaints.  Cardiothoracic surgery input is highly appreciated.  Cardiothoracic team plans to apply suction to the chest tube.  12/19/2023: CT surgery is directing postop care.  Chest tube to waterseal today as per CT surgery.  Patient continues to improve.  Assessment/Plan: Principal Problem:   Pneumothorax Active Problems:   Spontaneous pneumothorax   Protein-calorie malnutrition, severe   S/p Right Robotic Assisted Video Thoracoscopy with Apical Bleb Resection, Mechanical Pleurodesis  Right-sided pneumothorax, recurrent pneumothorax Severe chest pain. Subcutaneous emphysema Possibly secondary to bullous emphysema.  CT scan of the chest with bullous emphysema at the apices.  Chest tube was placed in and was managed by PCCM but subsequently came out.  On the evening 12/12/2023 patient had increased pneumothorax so needed chest tube placement again and was briefly on nonrebreather mask.  Patient has developed extensive subcutaneous emphysema.  Had intractable pain in did not respond to IV Dilaudid push show was started on Dilaudid PCA since 12/14/2023 with significant relief.   Pulmonary following and recommended CT surgery evaluation.  At this time patient appears to be a good candidate for VATS ,bleb resection and pleurodesis likely this week by CT surgery.  12/17/2023: Cardiothoracic input is appreciated.  See above documentation.  Postop care as per cardiothoracic team. 12/19/2023: Patient continues to improve.  Chest tube to waterseal today.      COPD Continue DuoNebs, no overt wheezing.  On supplemental oxygen at 2.5 L. 31 2025: Improved air entry.   Current tobacco abuse -- 1-2 PPD x 40 years.   -Continue nicotine patch.    Chronic pain syndrome Arthritis Degenerative disc disease  - Follows with pain clinic outpatient, continue Percocet and Toradol, Protonix.  High tolerance to narcotics.  Has been added on muscle relaxant as well.  Currently on Dilaudid pump.  Mild hyponatremia.  Will continue to monitor.  Sodium of 131 from initial 125.[name]  Could be secondary to pain.  Asymptomatic. 12/19/2023: Sodium of 135.  Mild leukocytosis.   -Likely reactive.   -Resolved.    Severe protein  calorie malnutrition.Body mass index is 16.49 kg/m.  Nutrition Status: Nutrition Problem: Severe Malnutrition Etiology: chronic illness Signs/Symptoms: severe muscle depletion, severe fat depletion Interventions: Refer to RD note for recommendations Continue nutritional supplements.  Courage to oral nutrition.    DVT prophylaxis: SCD's Start: 12/17/23 1816 enoxaparin (LOVENOX) injection 40 mg Start: 12/11/23 2200   Disposition: Home, uncertain at this time, likely need surgical intervention.  Likely in 3 to 4 days.  Status is: Inpatient Remains inpatient appropriate because: Pneumothorax status post chest tube placement, pending clinical improvement, severe pain on Dilaudid drip,  need for VATS.    Code Status:     Code Status: Full Code  Family Communication: None  Consultants: PCCM  Cardiothoracic surgery  Procedures: Right sided chest tube placement x2 XI  ROBOTIC ASSISTED THORACOSCOPY (Right) -Wedge Resection (Apical Blebectomy) Right Upper Lobe -Apical Pleurectomy -Mechanical Pleurodesis  Anti-infectives:  None  Anti-infectives (From admission, onward)    Start     Dose/Rate Route Frequency Ordered Stop   12/17/23 2200  ceFAZolin (ANCEF) IVPB 2g/100 mL premix        2 g 200 mL/hr over 30 Minutes Intravenous Every 8 hours 12/17/23 1728 12/18/23 0645       Subjective: -Seen postop. -No new complaints.  Objective: Vitals:   12/19/23 1119 12/19/23 1350  BP: 117/71 133/86  Pulse:  95  Resp:  20  Temp: 98.3 F (36.8 C) 98.3 F (36.8 C)  SpO2:  96%    Intake/Output Summary (Last 24 hours) at 12/19/2023 1700 Last data filed at 12/19/2023 0847 Gross per 24 hour  Intake 360 ml  Output 3350 ml  Net -2990 ml   Filed Weights   12/11/23 0759 12/17/23 1140  Weight: 56.7 kg 56.7 kg   Body mass index is 16.49 kg/m.   Physical Exam:  GENERAL: Patient is thin.  Not in any distress.  Awake and alert.  Right-sided chest tube. HEENT: Mild pallor. Neck: Supple. Lungs: Improved air entry right lung field.   CVS: S1-S2. Neuro: Awake and alert.  Moves all extremities. Extremities: No leg edema.    Data Review: I have personally reviewed the following laboratory data and studies,  CBC: Recent Labs  Lab 12/13/23 0724 12/15/23 0456 12/16/23 0651 12/18/23 0330 12/19/23 1005  WBC 9.8 7.8 7.5 11.3* 11.9*  NEUTROABS  --   --   --  8.3*  --   HGB 12.4* 11.4* 11.5* 11.0* 12.3*  HCT 35.8* 34.2* 34.3* 33.0* 36.8*  MCV 88.4 90.0 90.5 90.4 90.0  PLT 293 304 326 385 451*   Basic Metabolic Panel: Recent Labs  Lab 12/13/23 0724 12/15/23 0456 12/16/23 0651 12/18/23 0330 12/18/23 0331 12/19/23 1005  NA 125* 129* 131* 135 134* 135  K 4.5 4.3 4.5 4.3 4.3 3.9  CL 92* 96* 94* 98 97* 97*  CO2 24 27 29 29 30 26   GLUCOSE 109* 103* 87 143* 144* 139*  BUN 23 20 23  27* 28* 17  CREATININE 1.24 1.07 1.04 1.17 1.18 0.88  CALCIUM 8.5*  8.0* 8.5* 8.2* 8.2* 9.0  MG 1.9 2.0 2.0 2.1  --   --   PHOS  --  4.8* 4.1  --  3.6  --    Liver Function Tests: Recent Labs  Lab 12/18/23 0331 12/19/23 1005  AST  --  34  ALT  --  28  ALKPHOS  --  92  BILITOT  --  0.4  PROT  --  6.4*  ALBUMIN 2.6* 2.8*   No results for input(s): "LIPASE", "AMYLASE" in the last 168 hours. No results for input(s): "AMMONIA" in the last 168 hours. Cardiac Enzymes: No results for input(s): "CKTOTAL", "CKMB", "CKMBINDEX", "TROPONINI" in the last 168 hours. BNP (last 3 results) No results for input(s): "BNP" in the last 8760 hours.  ProBNP (last 3 results) No results for input(s): "PROBNP" in the last 8760 hours.  CBG: No results for input(s): "GLUCAP" in the last 168 hours. Recent Results (from the past 240 hours)  Surgical pcr screen     Status: Abnormal   Collection Time: 12/17/23 11:53 AM   Specimen: Nasal Mucosa; Nasal Swab  Result Value Ref Range Status   MRSA, PCR POSITIVE (A) NEGATIVE Final  Comment: RESULT CALLED TO, READ BACK BY AND VERIFIED WITH: RN MARY T on [phone] @1426  by SM    Staphylococcus aureus POSITIVE (A) NEGATIVE Final    Comment: (NOTE) The Xpert SA Assay (FDA approved for NASAL specimens in patients 28 years of age and older), is one component of a comprehensive surveillance program. It is not intended to diagnose infection nor to guide or monitor treatment. Performed at [facility], [address] [place], [place] [address]      Studies: DG CHEST PORT 1 VIEW Result Date: 12/19/2023 CLINICAL DATA:  Pneumothorax. EXAM: PORTABLE CHEST 1 VIEW COMPARISON:  Chest radiograph from the prior day. FINDINGS: The heart size and mediastinal contours are within normal limits. A right-sided thoracostomy tube is unchanged in position with the tip at the right lung apex. There is mild bibasilar atelectasis. No significant pneumothorax on either side. The visualized skeletal structures are unremarkable. IMPRESSION: No  significant pneumothorax. Mild bibasilar atelectasis. Electronically Signed   By: Romona Curls M.D.   On: 12/19/2023 11:20   DG Chest Port 1 View Result Date: 12/18/2023 CLINICAL DATA:  Status post lung surgery.  Follow up pneumothorax. EXAM: PORTABLE CHEST 1 VIEW COMPARISON:  Radiographs 12/17/2023, 12/15/2023 and 12/14/2023. Chest CT 12/11/2023. FINDINGS: 0538 hours. The right chest tube is unchanged, tip extending to the right lung apex. No residual pneumothorax identified. Chain staples at the right lung apex are noted. There is minimal atelectasis at both lung bases. The heart size and mediastinal contours are stable. Moderate soft tissue emphysema throughout the right chest wall again noted. IMPRESSION: 1. No residual pneumothorax identified. Right chest tube in place with similar chest wall soft tissue emphysema. 2. Minimal bibasilar atelectasis. Electronically Signed   By: Carey Bullocks M.D.   On: 12/18/2023 10:14     Time spent: 35 minutes.  Barnetta Chapel, MD  [facility] 12/19/2023  If 7PM-7AM, please contact night-coverage

## 2023-12-19 NOTE — Progress Notes (Signed)
     [address]       [place] [address]             [phone]       No events CT to waterseal today  Corliss Skains

## 2023-12-20 ENCOUNTER — Inpatient Hospital Stay (HOSPITAL_COMMUNITY)

## 2023-12-20 ENCOUNTER — Other Ambulatory Visit: Payer: Self-pay | Admitting: Internal Medicine

## 2023-12-20 DIAGNOSIS — Z09 Encounter for follow-up examination after completed treatment for conditions other than malignant neoplasm: Secondary | ICD-10-CM

## 2023-12-20 DIAGNOSIS — E43 Unspecified severe protein-calorie malnutrition: Secondary | ICD-10-CM | POA: Diagnosis not present

## 2023-12-20 DIAGNOSIS — J9383 Other pneumothorax: Secondary | ICD-10-CM | POA: Diagnosis not present

## 2023-12-20 MED ORDER — PANTOPRAZOLE SODIUM 40 MG PO TBEC: 40.0000 mg | DELAYED_RELEASE_TABLET | Freq: Every day | ORAL | 0 refills | Status: AC

## 2023-12-20 MED ORDER — QUETIAPINE FUMARATE 50 MG PO TABS
50.0000 mg | ORAL_TABLET | Freq: Every day | ORAL | 0 refills | Status: AC
Start: 2023-12-20 — End: ?

## 2023-12-20 MED ORDER — POLYETHYLENE GLYCOL 3350 17 G PO PACK: 17.0000 g | PACK | Freq: Every day | ORAL | 0 refills | Status: AC

## 2023-12-20 MED ORDER — MUPIROCIN 2 % EX OINT: 1.0000 | TOPICAL_OINTMENT | Freq: Two times a day (BID) | CUTANEOUS | 0 refills | Status: AC

## 2023-12-20 MED ORDER — SENNOSIDES-DOCUSATE SODIUM 8.6-50 MG PO TABS: 1.0000 | ORAL_TABLET | Freq: Every day | ORAL | 0 refills | Status: AC

## 2023-12-20 MED ORDER — ENSURE ENLIVE PO LIQD: 237.0000 mL | Freq: Three times a day (TID) | ORAL | 12 refills | Status: AC

## 2023-12-20 MED ORDER — DOCUSATE SODIUM 100 MG PO CAPS: 100.0000 mg | ORAL_CAPSULE | Freq: Two times a day (BID) | ORAL | 0 refills | Status: AC

## 2023-12-20 MED ORDER — ADULT MULTIVITAMIN W/MINERALS CH: 1.0000 | ORAL_TABLET | Freq: Every day | ORAL | 1 refills | Status: AC

## 2023-12-20 MED ORDER — VITAMIN B-1 100 MG PO TABS: 100.0000 mg | ORAL_TABLET | Freq: Every day | ORAL | 1 refills | Status: AC

## 2023-12-20 NOTE — Progress Notes (Addendum)
      [address]       [place] [address]             [phone]      3 Days Post-Op Procedure(s) (LRB): XI ROBOTIC ASSISTED THORACOSCOPY-WEDGE RESECTION (Right) INTERCOSTAL NERVE BLOCK (Right)  Subjective:  Patient is anxious about chest tube removal.  Also states that his abdomen is swollen.  He has moved his bowels w/o difficulty.   Denies N/V  Objective: Vital signs in last 24 hours: Temp:  [97.7 F (36.5 C)-99.1 F (37.3 C)] 98.3 F (36.8 C) (03/02 0346) Pulse Rate:  [82-99] 98 (03/02 0346) Cardiac Rhythm: Normal sinus rhythm (03/01 1904) Resp:  [18-20] 18 (03/02 0346) BP: (104-133)/(64-86) 124/64 (03/02 0346) SpO2:  [96 %-97 %] 97 % (03/02 0346)  Intake/Output from previous day: 03/01 0701 - 03/02 0700 In: 480 [P.O.:480] Out: 2072 [Urine:2025; Chest Tube:47]  General appearance: alert, cooperative, cachectic, and no distress Heart: regular rate and rhythm Lungs: clear to auscultation bilaterally Abdomen: soft, mild distention, + BS Wound: clean and dry  Lab Results: Recent Labs    12/18/23 0330 12/19/23 1005  WBC 11.3* 11.9*  HGB 11.0* 12.3*  HCT 33.0* 36.8*  PLT 385 451*   BMET:  Recent Labs    12/18/23 0331 12/19/23 1005  NA 134* 135  K 4.3 3.9  CL 97* 97*  CO2 30 26  GLUCOSE 144* 139*  BUN 28* 17  CREATININE 1.18 0.88  CALCIUM 8.2* 9.0    PT/INR: No results for input(s): "LABPROT", "INR" in the last 72 hours. ABG    Component Value Date/Time   HCO3 23.2 03/14/2019 2049   ACIDBASEDEF 4.4 (H) 03/14/2019 2049   O2SAT 84.7 03/14/2019 2049   CBG (last 3)  No results for input(s): "GLUCAP" in the last 72 hours.  Assessment/Plan: S/P Procedure(s) (LRB): XI ROBOTIC ASSISTED THORACOSCOPY-WEDGE RESECTION (Right) INTERCOSTAL NERVE BLOCK (Right)  Spontaneous Pneumothorax, due to Bullous Emphysema.. persistent air leak requiring VATS procedure with Blebectomy, pleurodesis.[name] doing well, CT in place on water seal w/o air  leak.[name] No pneumothorax on CXR  Plan:  D/c chest tube today, repeat CXR at 12... if stable can d/c from surgical standpoint.. medicine issues per primary.... f/u appointment has been made in chart.   LOS: 9 days    Lowella Dandy, PA-C 12/20/2023  Agree with above Chest tube out  Gannon Heinzman O Demya Scruggs

## 2023-12-20 NOTE — Progress Notes (Signed)
 Bea Laura to be D/C'd home per MD order. Discussed with the patient and all questions fully answered. Extra dressing supplies sent with patient. Skin clean and dry, dressing to previous chest tube site CDI.   IV catheter discontinued intact. Site without signs and symptoms of complications. Dressing and pressure applied.  An After Visit Summary was printed and given to the patient.  Patient escorted via WC, and D/C home via private auto.  Jon Gills  12/20/2023

## 2023-12-20 NOTE — Discharge Summary (Signed)
 Physician Discharge Summary  Patient ID: Wayne Rowe MRN: [id number] DOB/AGE: September 19, 1961 63 y.o.  Admit date: 12/11/2023 Discharge date: 12/20/2023  Admission Diagnoses:  Discharge Diagnoses:  Principal Problem:   Pneumothorax Active Problems:   Spontaneous pneumothorax   Protein-calorie malnutrition, severe   S/p Right Robotic Assisted Video Thoracoscopy with Apical Bleb Resection, Mechanical Pleurodesis   Discharged Condition: stable  Hospital Course:  Patient is a 63 year old male, with past medical history significant for diabetes, active tobacco use, COPD polyosteoarthritis, chronic pain, and anxiety.  Patient presented to hospital with right-sided stabbing chest pain, deep with inspiration, with worsening shortness of breath.  Patient is current smoker.  On presentation to the ED, patient was noted to have large right-sided pneumothorax and Pigtail chest tube was placed.  On 12/17/2023, patient underwent XI ROBOTIC ASSISTED THORACOSCOPY (Right), wedge Resection (Apical Blebectomy) Right Upper Lobe, apical Pleurectomy and mechanical Pleurodesis.  Cardiothoracic surgery and pulmonary/critical care team assisted in directing patient's care.  Patient continued to improve.  Chest tube has been removed.  Patient has been cleared for discharge by both cardiothoracic surgery team and pulmonary/critical care team.  Patient will follow-up with primary care provider, cardiothoracic surgery and pulmonary/critical care teams on discharge.   Right-sided pneumothorax, recurrent pneumothorax Severe chest pain. Subcutaneous emphysema -Likely secondary to bullous emphysema.   -CT scan of the chest revealed bullous emphysema at the apices.   -Chest tube was placed in and was managed by PCCM but subsequently came out.   -On the evening of 12/12/2023, patient had increased pneumothorax, so, patient needed chest tube placement again and was briefly on nonrebreather mask.   -Patient has developed extensive  subcutaneous emphysema.   -Patient had intractable pain in did not respond to IV Dilaudid push show was started on Dilaudid PCA since 12/14/2023 with significant relief.   -Pulmonary/critical care team recommended CT surgery evaluation.   -Patient eventually underwent VATS, bleb resection and pleurodesis    COPD Continued DuoNebs, no overt wheezing.  On supplemental oxygen at 2.5 L.   Current tobacco abuse -- 1-2 PPD x 40 years.   -Continue nicotine patch.     Chronic pain syndrome Arthritis Degenerative disc disease  - Follows with pain clinic outpatient, continue Percocet and Toradol, Protonix.  High tolerance to narcotics.     Mild hyponatremia: -Initial sodium of 125.  Improved to 131. -Could be secondary to pain. -Sodium prior to discharge was 135.     Mild leukocytosis.   -Likely reactive.   -Resolved.     Severe protein  calorie malnutrition.Body mass index is 16.49 kg/m.  Nutrition Status: -Severe Malnutrition -Continue nutritional supplements.  Courage to oral nutrition.     Consults: pulmonary/intensive care and cardiothoracic surgery  Significant Diagnostic Studies:  CT CHEST WITHOUT CONTRAST:   TECHNIQUE: Multidetector CT imaging of the chest was performed following the standard protocol without IV contrast.   RADIATION DOSE REDUCTION: This exam was performed according to the departmental dose-optimization program which includes automated exposure control, adjustment of the mA and/or kV according to patient size and/or use of iterative reconstruction technique.   COMPARISON:  12/11/2023, 03/14/2019   FINDINGS: Cardiovascular: Unenhanced imaging of the heart is unremarkable without pericardial effusion. Normal caliber of the thoracic aorta. Atherosclerosis of the aorta and coronary vasculature.   Mediastinum/Nodes: No enlarged mediastinal or axillary lymph nodes. Thyroid gland, trachea, and esophagus demonstrate no significant findings.    Lungs/Pleura: Stable position of the right-sided pigtail drainage catheter, coiled within the right anterior hemithorax. There  are at least 2 side holes that project outside of the pleural space, increasing likelihood of air leak. Subcutaneous gas is seen within the right chest wall along the course of the catheter.   There is a persistent anterior right pneumothorax, volume estimated 10-15%. No mediastinal shift or tension effect. There are areas of dense consolidation within the dependent right lower lobe and right middle lobe, favoring atelectasis. Areas of ground-glass airspace disease elsewhere throughout the right lung, greatest in the mid and lower lung zones, could reflect residual hypoventilatory change or edema.   The left chest is clear. Bullous emphysematous changes are seen bilaterally, greatest at the apices. Central airways are patent.   Upper Abdomen: No acute abnormality.   Musculoskeletal: No acute or destructive bony abnormalities. Reconstructed images demonstrate no additional findings.   IMPRESSION: 1. Persistent anterior right pneumothorax, volume estimated 10-15%. No tension effect. 2. Right-sided pigtail pleural drainage catheter as above, with several proximal side port projecting outside the pleural space. This increases possibility of air leak. There is some subcutaneous gas along the course of the catheter, which could be due to air leak or related to catheter placement. 3. Scattered ground-glass opacities throughout the right lung, which may reflect re-expansion edema or residual hypoventilatory change. Denser areas of atelectasis are seen within the right middle and right lower lobes. 4. Bullous emphysema, greatest at the apices. 5.  Aortic Atherosclerosis (ICD10-I70.0).     Electronically Signed   By: Sharlet Salina M.D.   On: 12/11/2023 21:16     CHEST - 2 VIEW:   COMPARISON:  12/20/2023, 12/18/2023, CT chest 12/11/2023    FINDINGS: Interim removal of right-sided chest tube. Postsurgical changes at the right apex. No definitive pneumothorax. Subsegmental atelectasis at the bases. Moderate right chest wall emphysema   IMPRESSION: 1. Removal of right-sided chest tube, no definitive pneumothorax is seen 2. Subsegmental atelectasis at the bases     Electronically Signed   By: Jasmine Pang M.D.   On: 12/20/2023 16:30  Treatments:  Procedure: - Robotic assisted right video thoracoscopy - Wedge resection of the right upper lobe - Apical pleurectomy - Mechanical pleurodesis - Intercostal nerve block  Discharge Exam: Blood pressure 117/68, pulse 89, temperature 98.4 F (36.9 C), temperature source Oral, resp. rate 15, height 6\' 1"  (1.854 m), weight 56.7 kg, SpO2 92%.   Disposition: Discharge disposition: 01-Home or Self Care       Discharge Instructions     Diet - low sodium heart healthy   Complete by: As directed    Discharge wound care:   Complete by: As directed    Continue current wound care plan   Increase activity slowly   Complete by: As directed       Allergies as of 12/20/2023   No Known Allergies      Medication List     STOP taking these medications    aspirin EC 81 MG tablet   ibuprofen 200 MG tablet Commonly known as: ADVIL   ondansetron 4 MG disintegrating tablet Commonly known as: Zofran ODT       TAKE these medications    carisoprodol 350 MG tablet Commonly known as: SOMA Take 350 mg by mouth 4 (four) times daily.   docusate sodium 100 MG capsule Commonly known as: COLACE Take 1 capsule (100 mg total) by mouth 2 (two) times daily.   feeding supplement Liqd Take 237 mLs by mouth 3 (three) times daily between meals.   lidocaine-prilocaine cream Commonly known as: EMLA  Apply 1 Application topically 4 (four) times daily as needed (Pain).   multivitamin with minerals Tabs tablet Take 1 tablet by mouth daily. Start taking on: December 21, 2023    mupirocin ointment 2 % Commonly known as: BACTROBAN Place 1 Application into the nose 2 (two) times daily.   Oxycodone HCl 10 MG Tabs Take 10 mg by mouth every 3 (three) hours.   pantoprazole 40 MG tablet Commonly known as: PROTONIX Take 1 tablet (40 mg total) by mouth daily. Start taking on: December 21, 2023   polyethylene glycol 17 g packet Commonly known as: MIRALAX / GLYCOLAX Take 17 g by mouth daily. Start taking on: December 21, 2023   QUEtiapine 50 MG tablet Commonly known as: SEROQUEL Take 1 tablet (50 mg total) by mouth at bedtime.   senna-docusate 8.6-50 MG tablet Commonly known as: Senokot-S Take 1 tablet by mouth at bedtime.   tamsulosin 0.4 MG Caps capsule Commonly known as: FLOMAX Take 0.4 mg by mouth every evening.   thiamine 100 MG tablet Commonly known as: Vitamin B-1 Take 1 tablet (100 mg total) by mouth daily. Start taking on: December 21, 2023   Trelegy Ellipta 100-62.5-25 MCG/INH Aepb Generic drug: Fluticasone-Umeclidin-Vilant Inhale 1 puff into the lungs daily.   Ventolin HFA 108 (90 Base) MCG/ACT inhaler Generic drug: albuterol Inhale 1-2 puffs into the lungs every 6 (six) hours as needed for wheezing or shortness of breath.               Discharge Care Instructions  (From admission, onward)           Start     Ordered   12/20/23 0000  Discharge wound care:       Comments: Continue current wound care plan   12/20/23 1639            Follow-up Information     Corliss Skains, MD. Go on 01/01/2024.   Specialty: Cardiothoracic Surgery Why: Appointment time is at 11:30 am Contact information: [address] [place] [address] [phone]                 Time spent: 35 Minutes.  SignedBarnetta Chapel 12/20/2023, 4:39 PM

## 2023-12-20 NOTE — Progress Notes (Signed)
      [address]       [place] [address]             [phone]      CXR reviewed and is stable.  We will sign off at this time... Care per medicine.  Follow up appointment is in the chart.  Lowella Dandy, PA-C 12:59 PM 12/20/23

## 2023-12-21 LAB — SURGICAL PATHOLOGY

## 2024-01-01 ENCOUNTER — Encounter: Payer: Self-pay | Admitting: Thoracic Surgery (Cardiothoracic Vascular Surgery)

## 2024-01-01 ENCOUNTER — Ambulatory Visit (INDEPENDENT_AMBULATORY_CARE_PROVIDER_SITE_OTHER): Payer: 59 | Admitting: Thoracic Surgery (Cardiothoracic Vascular Surgery)

## 2024-01-01 VITALS — BP 112/67 | HR 96 | Resp 20 | Wt 123.8 lb

## 2024-01-01 DIAGNOSIS — Z09 Encounter for follow-up examination after completed treatment for conditions other than malignant neoplasm: Secondary | ICD-10-CM

## 2024-01-01 NOTE — Progress Notes (Signed)
      [address]       [place] [address]             [phone]        LARSEN DUNGAN [facility] Medical Record #[id number] Date of Birth: 1961/08/18  Referring: Lorin Glass, MD Primary Care: Elfredia Nevins, MD Primary Cardiologist:None  Reason for visit:   follow-up  History of Present Illness:     63 year old male presents for 8-month follow-up.  He continues to have some pain but this is manageable with over-the-counter medications.  Physical Exam: BP 112/67 (BP Location: Right Arm, Patient Position: Sitting, Cuff Size: Normal)   Pulse 96   Resp 20   Wt 123 lb 12.8 oz (56.2 kg)   SpO2 95% Comment: RA  BMI 16.33 kg/m   Alert NAD Incision clean.   Abdomen, ND Now peripheral edema   Diagnostic Studies & Laboratory data:  Path: Benign    Assessment / Plan:   63 year old male status post robotic assisted wedge resection, apical pleurectomy, mechanical pleurodesis for spontaneous pneumothorax.  Overall he is doing well.  He will follow-up in 1 month with a chest x-ray.   Corliss Skains 01/01/2024 12:10 PM

## 2024-01-06 DIAGNOSIS — J449 Chronic obstructive pulmonary disease, unspecified: Secondary | ICD-10-CM | POA: Diagnosis not present

## 2024-01-06 DIAGNOSIS — G894 Chronic pain syndrome: Secondary | ICD-10-CM | POA: Diagnosis not present

## 2024-01-06 DIAGNOSIS — M159 Polyosteoarthritis, unspecified: Secondary | ICD-10-CM | POA: Diagnosis not present

## 2024-01-06 DIAGNOSIS — J9383 Other pneumothorax: Secondary | ICD-10-CM | POA: Diagnosis not present

## 2024-01-06 DIAGNOSIS — Z0001 Encounter for general adult medical examination with abnormal findings: Secondary | ICD-10-CM | POA: Diagnosis not present

## 2024-01-06 DIAGNOSIS — Z681 Body mass index (BMI) 19 or less, adult: Secondary | ICD-10-CM | POA: Diagnosis not present

## 2024-01-06 DIAGNOSIS — I700 Atherosclerosis of aorta: Secondary | ICD-10-CM | POA: Diagnosis not present

## 2024-01-07 ENCOUNTER — Encounter: Payer: Self-pay | Admitting: *Deleted

## 2024-01-18 NOTE — Progress Notes (Deleted)
     [address]       [place] [address]             [phone]     History of Present Illness: This is a 63 year old male who is s/p robotic assisted right video thoracoscopy, wedge resection of the right upper lobe, apical pleurectomy, mechanical  Pleurodesis, and intercostal nerve block by Dr. Cliffton Asters on 12/17/2023. Pathology results revealed bleb and no malignancy. He was seen by Dr.  Cliffton Asters on 01/01/2024. He was doing well but was still having some post op pain, which is relieved with medication.  Current Outpatient Medications  Medication Sig Dispense Refill   carisoprodol (SOMA) 350 MG tablet Take 350 mg by mouth 4 (four) times daily.     docusate sodium (COLACE) 100 MG capsule Take 1 capsule (100 mg total) by mouth 2 (two) times daily. 10 capsule 0   feeding supplement (ENSURE ENLIVE / ENSURE PLUS) LIQD Take 237 mLs by mouth 3 (three) times daily between meals. 237 mL 12   lidocaine-prilocaine (EMLA) cream Apply 1 Application topically 4 (four) times daily as needed (Pain).     Multiple Vitamin (MULTIVITAMIN WITH MINERALS) TABS tablet Take 1 tablet by mouth daily. 30 tablet 1   mupirocin ointment (BACTROBAN) 2 % Place 1 Application into the nose 2 (two) times daily. 22 g 0   Oxycodone HCl 10 MG TABS Take 10 mg by mouth every 3 (three) hours.     pantoprazole (PROTONIX) 40 MG tablet Take 1 tablet (40 mg total) by mouth daily. 30 tablet 0   polyethylene glycol (MIRALAX / GLYCOLAX) 17 g packet Take 17 g by mouth daily. 14 each 0   QUEtiapine (SEROQUEL) 50 MG tablet Take 1 tablet (50 mg total) by mouth at bedtime. 30 tablet 0   senna-docusate (SENOKOT-S) 8.6-50 MG tablet Take 1 tablet by mouth at bedtime. 30 tablet 0   tamsulosin (FLOMAX) 0.4 MG CAPS capsule Take 0.4 mg by mouth every evening.  0   thiamine (VITAMIN B-1) 100 MG tablet Take 1 tablet (100 mg total) by mouth daily. 30 tablet 1   TRELEGY ELLIPTA 100-62.5-25 MCG/INH AEPB Inhale 1 puff into the  lungs daily.     VENTOLIN HFA 108 (90 Base) MCG/ACT inhaler Inhale 1-2 puffs into the lungs every 6 (six) hours as needed for wheezing or shortness of breath.     Vital Signs:   Physical Exam: CV Pulmonary Extremities Wounds   Diagnostic Tests:   Impression and Plan: We reviewed the results of today's chest x ray. We discussed the importance of smoking cessation. He will return to [facility] PRN.    Ardelle Balls, PA-C [facility] [phone]

## 2024-01-25 ENCOUNTER — Other Ambulatory Visit: Payer: Self-pay | Admitting: Thoracic Surgery (Cardiothoracic Vascular Surgery)

## 2024-01-25 DIAGNOSIS — Z09 Encounter for follow-up examination after completed treatment for conditions other than malignant neoplasm: Secondary | ICD-10-CM

## 2024-01-28 ENCOUNTER — Other Ambulatory Visit: Payer: Self-pay | Admitting: Thoracic Surgery (Cardiothoracic Vascular Surgery)

## 2024-01-28 DIAGNOSIS — Z09 Encounter for follow-up examination after completed treatment for conditions other than malignant neoplasm: Secondary | ICD-10-CM

## 2024-02-01 ENCOUNTER — Ambulatory Visit

## 2024-02-01 ENCOUNTER — Encounter: Payer: Self-pay | Admitting: Thoracic Surgery (Cardiothoracic Vascular Surgery)

## 2024-03-18 DIAGNOSIS — E039 Hypothyroidism, unspecified: Secondary | ICD-10-CM | POA: Diagnosis not present

## 2024-03-18 DIAGNOSIS — Z0001 Encounter for general adult medical examination with abnormal findings: Secondary | ICD-10-CM | POA: Diagnosis not present

## 2024-03-18 DIAGNOSIS — Z681 Body mass index (BMI) 19 or less, adult: Secondary | ICD-10-CM | POA: Diagnosis not present

## 2024-03-18 DIAGNOSIS — E559 Vitamin D deficiency, unspecified: Secondary | ICD-10-CM | POA: Diagnosis not present

## 2024-03-18 DIAGNOSIS — F5101 Primary insomnia: Secondary | ICD-10-CM | POA: Diagnosis not present

## 2024-03-18 DIAGNOSIS — G894 Chronic pain syndrome: Secondary | ICD-10-CM | POA: Diagnosis not present

## 2024-03-18 DIAGNOSIS — D518 Other vitamin B12 deficiency anemias: Secondary | ICD-10-CM | POA: Diagnosis not present

## 2024-03-18 DIAGNOSIS — I700 Atherosclerosis of aorta: Secondary | ICD-10-CM | POA: Diagnosis not present

## 2024-03-18 DIAGNOSIS — M159 Polyosteoarthritis, unspecified: Secondary | ICD-10-CM | POA: Diagnosis not present

## 2024-03-18 DIAGNOSIS — J449 Chronic obstructive pulmonary disease, unspecified: Secondary | ICD-10-CM | POA: Diagnosis not present

## 2024-05-12 DIAGNOSIS — M159 Polyosteoarthritis, unspecified: Secondary | ICD-10-CM | POA: Diagnosis not present

## 2024-05-12 DIAGNOSIS — J449 Chronic obstructive pulmonary disease, unspecified: Secondary | ICD-10-CM | POA: Diagnosis not present

## 2024-05-12 DIAGNOSIS — J329 Chronic sinusitis, unspecified: Secondary | ICD-10-CM | POA: Diagnosis not present

## 2024-05-12 DIAGNOSIS — G894 Chronic pain syndrome: Secondary | ICD-10-CM | POA: Diagnosis not present

## 2024-05-12 DIAGNOSIS — Z681 Body mass index (BMI) 19 or less, adult: Secondary | ICD-10-CM | POA: Diagnosis not present

## 2024-06-16 DIAGNOSIS — G894 Chronic pain syndrome: Secondary | ICD-10-CM | POA: Diagnosis not present

## 2024-06-16 DIAGNOSIS — J449 Chronic obstructive pulmonary disease, unspecified: Secondary | ICD-10-CM | POA: Diagnosis not present

## 2024-06-16 DIAGNOSIS — M1991 Primary osteoarthritis, unspecified site: Secondary | ICD-10-CM | POA: Diagnosis not present

## 2024-09-30 ENCOUNTER — Other Ambulatory Visit: Payer: Self-pay

## 2024-09-30 ENCOUNTER — Emergency Department (HOSPITAL_COMMUNITY)
Admission: EM | Admit: 2024-09-30 | Discharge: 2024-09-30 | Disposition: A | Discharge status: Home / Self Care | Attending: Emergency Medicine | Admitting: Emergency Medicine

## 2024-09-30 ENCOUNTER — Encounter (HOSPITAL_COMMUNITY): Payer: Self-pay | Admitting: Emergency Medicine

## 2024-09-30 ENCOUNTER — Emergency Department (HOSPITAL_COMMUNITY)

## 2024-09-30 DIAGNOSIS — Z760 Encounter for issue of repeat prescription: Secondary | ICD-10-CM | POA: Diagnosis not present

## 2024-09-30 DIAGNOSIS — M545 Low back pain, unspecified: Secondary | ICD-10-CM | POA: Diagnosis present

## 2024-09-30 DIAGNOSIS — N5089 Other specified disorders of the male genital organs: Secondary | ICD-10-CM

## 2024-09-30 DIAGNOSIS — N509 Disorder of male genital organs, unspecified: Secondary | ICD-10-CM | POA: Diagnosis not present

## 2024-09-30 DIAGNOSIS — G8929 Other chronic pain: Secondary | ICD-10-CM | POA: Diagnosis not present

## 2024-09-30 DIAGNOSIS — J4489 Other specified chronic obstructive pulmonary disease: Secondary | ICD-10-CM | POA: Diagnosis not present

## 2024-09-30 LAB — CBC
HCT: 43.6 % (ref 39.0–52.0)
Hemoglobin: 14.6 g/dL (ref 13.0–17.0)
MCH: 31 pg (ref 26.0–34.0)
MCHC: 33.5 g/dL (ref 30.0–36.0)
MCV: 92.6 fL (ref 80.0–100.0)
Platelets: 387 K/uL (ref 150–400)
RBC: 4.71 MIL/uL (ref 4.22–5.81)
RDW: 12.1 % (ref 11.5–15.5)
WBC: 7.8 K/uL (ref 4.0–10.5)
nRBC: 0 % (ref 0.0–0.2)

## 2024-09-30 LAB — BASIC METABOLIC PANEL WITH GFR
Anion gap: 12 (ref 5–15)
BUN: 15 mg/dL (ref 8–23)
CO2: 22 mmol/L (ref 22–32)
Calcium: 8.7 mg/dL — ABNORMAL LOW (ref 8.9–10.3)
Chloride: 104 mmol/L (ref 98–111)
Creatinine, Ser: 1.05 mg/dL (ref 0.61–1.24)
GFR, Estimated: >60 mL/min (ref >60)
Glucose, Bld: 100 mg/dL — ABNORMAL HIGH (ref 70–99)
Potassium: 3.8 mmol/L (ref 3.5–5.1)
Sodium: 138 mmol/L (ref 135–145)

## 2024-09-30 LAB — TROPONIN T, HIGH SENSITIVITY
Troponin T High Sensitivity: <15 ng/L (ref 0–19)
Troponin T High Sensitivity: <15 ng/L (ref 0–19)

## 2024-09-30 LAB — D-DIMER, QUANTITATIVE: D-Dimer, Quant: 0.49 ug{FEU}/mL (ref 0.00–0.50)

## 2024-09-30 MED ORDER — CARISOPRODOL 350 MG PO TABS: 350.0000 mg | ORAL_TABLET | Freq: Four times a day (QID) | ORAL | 0 refills | Status: AC

## 2024-09-30 MED ORDER — OXYCODONE HCL 10 MG PO TABS: 10.0000 mg | ORAL_TABLET | ORAL | 0 refills | Status: AC

## 2024-09-30 MED ORDER — KETOROLAC TROMETHAMINE 15 MG/ML IJ SOLN
15.0000 mg | Freq: Once | INTRAMUSCULAR | Status: AC
  Administered 2024-09-30: 15 mg via INTRAVENOUS
  Filled 2024-09-30: qty 1

## 2024-09-30 MED ORDER — OXYCODONE HCL 5 MG PO TABS
5.0000 mg | ORAL_TABLET | Freq: Once | ORAL | Status: AC
  Administered 2024-09-30: 5 mg via ORAL
  Filled 2024-09-30: qty 1

## 2024-09-30 MED ORDER — TAMSULOSIN HCL 0.4 MG PO CAPS: 0.4000 mg | ORAL_CAPSULE | Freq: Every evening | ORAL | 0 refills | Status: AC

## 2024-09-30 MED ORDER — LIDOCAINE-PRILOCAINE 2.5-2.5 % EX CREA: 1.0000 | TOPICAL_CREAM | Freq: Four times a day (QID) | CUTANEOUS | 0 refills | Status: AC | PRN

## 2024-09-30 MED ORDER — PREDNISONE 10 MG PO TABS: 40.0000 mg | ORAL_TABLET | Freq: Every day | ORAL | 0 refills | Status: AC

## 2024-09-30 NOTE — Discharge Instructions (Signed)
 Refill of your medications was sent into your pharmacy.  A short course of prednisone  was sent into your pharmacy for your back pain.  You were provided a referral for urology to follow-up regarding your testicular mass.  Your chest x-ray and lab work did not show any abnormality.

## 2024-09-30 NOTE — ED Triage Notes (Addendum)
 Pt arrived to ED c/o being out of his home medications due to his PCP retired.   States out of soma , lidocaine  cream, oxy, protonix , seroquel , flowmax, trelegy, and albuterol  inhaler.

## 2024-09-30 NOTE — ED Provider Notes (Signed)
 [name] EMERGENCY DEPARTMENT AT [facility] Provider Note   CSN: [id number] Arrival date & time: 09/30/24  2001     Patient presents with: Medication Refill   Wayne Rowe is a 63 y.o. male history of COPD with spontaneous pneumothorax status post wedge resection, apical pleurectomy and mechanical pleurodesis on 2/25 presents with complaints of progressive right sided chest pain over the past month with associated productive cough.  Pain is described as pleuritic and exertional.  Has no cardiac history.  Reports that his primary care doctor gave him a course of antibiotics.  Which had no significant improvement of his symptoms.  Has no history of PE.  Additionally complains of testicular mass that is painful.  Reports that it has grown over the past several months.  Denies any urinary symptoms.  Additionally complains of chronic back pain with radicular symptoms down his right leg.  No urinary or fecal incontinence.  No history of IV drug use. Also reporting his primary care doctor recently retired and has been unable to get his medications..    Medication Refill     Past Medical History:  Diagnosis Date   Anxiety    Arthritis    Asthma    Back pain    BPH (benign prostatic hyperplasia)    COPD (chronic obstructive pulmonary disease) (HCC)    DDD (degenerative disc disease)    Emphysema of lung (HCC)    Pre-diabetes    Past Surgical History:  Procedure Laterality Date   BACK SURGERY     EYE SURGERY Left    reconstruction, hit with sledge hammer   INTERCOSTAL NERVE BLOCK Right 12/17/2023   Procedure: INTERCOSTAL NERVE BLOCK;  Surgeon: Shyrl Linnie KIDD, MD;  Location: [facility] OR;  Service: Thoracic;  Laterality: Right;     Prior to Admission medications  Medication Sig Start Date End Date Taking? Authorizing Provider  predniSONE  (DELTASONE ) 10 MG tablet Take 4 tablets (40 mg total) by mouth daily. 09/30/24  Yes Donnajean Lynwood DEL, PA-C  carisoprodol  (SOMA ) 350  MG tablet Take 1 tablet (350 mg total) by mouth 4 (four) times daily. 09/30/24   Donnajean Lynwood DEL, PA-C  docusate sodium  (COLACE) 100 MG capsule Take 1 capsule (100 mg total) by mouth 2 (two) times daily. 12/20/23   Rosario Leatrice FERNS, MD  feeding supplement (ENSURE ENLIVE / ENSURE PLUS) LIQD Take 237 mLs by mouth 3 (three) times daily between meals. 12/20/23   Rosario Leatrice FERNS, MD  lidocaine -prilocaine  (EMLA ) cream Apply 1 Application topically 4 (four) times daily as needed (Pain). 09/30/24   Donnajean Lynwood DEL, PA-C  Multiple Vitamin (MULTIVITAMIN WITH MINERALS) TABS tablet Take 1 tablet by mouth daily. 12/21/23   Rosario Leatrice FERNS, MD  mupirocin  ointment (BACTROBAN ) 2 % Place 1 Application into the nose 2 (two) times daily. 12/20/23   Rosario Leatrice FERNS, MD  Oxycodone  HCl 10 MG TABS Take 1 tablet (10 mg total) by mouth every 3 (three) hours. 09/30/24   Donnajean Lynwood DEL, PA-C  pantoprazole  (PROTONIX ) 40 MG tablet Take 1 tablet (40 mg total) by mouth daily. 12/21/23   Rosario Leatrice I, MD  polyethylene glycol (MIRALAX  / GLYCOLAX ) 17 g packet Take 17 g by mouth daily. 12/21/23   Rosario Leatrice FERNS, MD  QUEtiapine  (SEROQUEL ) 50 MG tablet Take 1 tablet (50 mg total) by mouth at bedtime. 12/20/23   Rosario Leatrice FERNS, MD  tamsulosin  (FLOMAX ) 0.4 MG CAPS capsule Take 1 capsule (0.4 mg total) by mouth every evening.  09/30/24   Donnajean Lynwood DEL, PA-C  thiamine  (VITAMIN B-1) 100 MG tablet Take 1 tablet (100 mg total) by mouth daily. 12/21/23   Rosario Leatrice FERNS, MD  TRELEGY ELLIPTA 100-62.5-25 MCG/INH AEPB Inhale 1 puff into the lungs daily. 09/16/19   [provider]  VENTOLIN  HFA 108 (90 Base) MCG/ACT inhaler Inhale 1-2 puffs into the lungs every 6 (six) hours as needed for wheezing or shortness of breath.  03/08/19   [provider]    Allergies: Patient has no known allergies.    Review of Systems  Cardiovascular:  Positive for chest pain.    Updated Vital Signs BP 106/67  (BP Location: Right Arm)   Pulse 78   Temp 97.8 F (36.6 C) (Oral)   Resp 15   Ht 6' 1 (1.854 m)   Wt 56.2 kg   SpO2 97%   BMI 16.35 kg/m   Physical Exam Vitals and nursing note reviewed.  Constitutional:      General: He is not in acute distress.    Appearance: He is well-developed.  HENT:     Head: Normocephalic and atraumatic.  Eyes:     Conjunctiva/sclera: Conjunctivae normal.  Cardiovascular:     Rate and Rhythm: Normal rate and regular rhythm.     Heart sounds: No murmur heard. Pulmonary:     Effort: Pulmonary effort is normal. No respiratory distress.     Breath sounds: Normal breath sounds.  Abdominal:     Palpations: Abdomen is soft.     Tenderness: There is no abdominal tenderness.  Genitourinary:    Comments: Approximately 2 cm in diameter nodule that appears fixed to the left testicle, tender to palpation, no scrotal swelling or erythema Musculoskeletal:        General: No swelling.     Cervical back: Neck supple.  Skin:    General: Skin is warm and dry.     Capillary Refill: Capillary refill takes less than 2 seconds.  Neurological:     Mental Status: He is alert.  Psychiatric:        Mood and Affect: Mood normal.     (all labs ordered are listed, but only abnormal results are displayed) Labs Reviewed  BASIC METABOLIC PANEL WITH GFR - Abnormal; Notable for the following components:      Result Value   Glucose, Bld 100 (*)    Calcium  8.7 (*)    All other components within normal limits  CBC  D-DIMER, QUANTITATIVE  TROPONIN T, HIGH SENSITIVITY  TROPONIN T, HIGH SENSITIVITY    EKG: None  Radiology: [facility] Chest Port 1 View Result Date: 09/30/2024 EXAM: 1 VIEW(S) XRAY OF THE CHEST 09/30/2024 09:21:22 PM COMPARISON: Chest x-ray 12/20/2023. CLINICAL HISTORY: cp FINDINGS: LUNGS AND PLEURA: Surgical staple line in the right upper lobe. The lungs are otherwise clear. No pleural effusion. No pneumothorax. HEART AND MEDIASTINUM: No acute abnormality of  the cardiac and mediastinal silhouettes. BONES AND SOFT TISSUES: No acute osseous abnormality. IMPRESSION: 1. No acute cardiopulmonary process. 2. Right upper lobe surgical staple line noted. Electronically signed by: Greig Pique MD 09/30/2024 09:24 PM EST RP Workstation: [id number]     Procedures   Medications Ordered in the ED  oxyCODONE  (Oxy IR/ROXICODONE ) immediate release tablet 5 mg (has no administration in time range)  ketorolac  (TORADOL ) 15 MG/ML injection 15 mg (has no administration in time range)    Clinical Course as of 09/30/24 2300  Fri Sep 30, 2024  2216 Patient with history of COPD  and spontaneous pneumothorax treated operatively in February evaluated for progressively worsening pleuritic chest pain with associated productive cough over the past month, additionally evaluated for acute on chronic back pain with radicular symptoms without red flag symptoms, additionally evaluated for painful growing testicular mass over the past several months additionally needing medication refill after his PCP retired. [JT]  2224 Workup is overall reassuring with negative troponins, negative dimer, no leukocytosis.  Patient will be discharged home.  Will provide his requested refills in order to provide time for PCP follow-up.  He states that he has set up an appointment.  Will send in taper for his acute on chronic back pain.  Do not feel that any urgent imaging or neurosurgery consult is indicated today.  Will provide urology follow-up for his testicular mass.  Unfortunately ultrasound is not available today. [JT]    Clinical Course User Index [JT] Donnajean Lynwood DEL, PA-C                                 Medical Decision Making  This patient presents to the ED with chief complaint(s) of multiple complaints .  The complaint involves an extensive differential diagnosis and also carries with it a high risk of complications and morbidity.   Pertinent past medical history as listed in HPI  The  differential diagnosis includes  ACS, pneumothorax, PE, pneumonia, COPD, hydrocele, malignancy Additional history obtained: Records reviewed Care Everywhere/External Records  Disposition:   Patient will be discharged home. The patient has been appropriately medically screened and/or stabilized in the ED. I have low suspicion for any other emergent medical condition which would require further screening, evaluation or treatment in the ED or require inpatient management. At time of discharge the patient is hemodynamically stable and in no acute distress. I have discussed work-up results and diagnosis with patient and answered all questions. Patient is agreeable with discharge plan. We discussed strict return precautions for returning to the emergency department and they verbalized understanding.     Social Determinants of Health:   none  This note was dictated with voice recognition software.  Despite best efforts at proofreading, errors may have occurred which can change the documentation meaning.       Final diagnoses:  Medication refill  Chronic left-sided low back pain with left-sided sciatica  Mass of left testicle    ED Discharge Orders          Ordered    tamsulosin  (FLOMAX ) 0.4 MG CAPS capsule  Every evening        09/30/24 2259    lidocaine -prilocaine  (EMLA ) cream  4 times daily PRN        09/30/24 2259    predniSONE  (DELTASONE ) 10 MG tablet  Daily        09/30/24 2259    Oxycodone  HCl 10 MG TABS  Every  3 hours        09/30/24 2259    carisoprodol  (SOMA ) 350 MG tablet  4 times daily        09/30/24 2259               Malvina Schadler H, PA-C 09/30/24 2300    Patsey Lot, MD 10/05/24 [phone]

## 2024-11-02 DIAGNOSIS — M7918 Myalgia, other site: Secondary | ICD-10-CM | POA: Insufficient documentation

## 2024-11-02 DIAGNOSIS — M533 Sacrococcygeal disorders, not elsewhere classified: Secondary | ICD-10-CM | POA: Insufficient documentation

## 2024-11-02 DIAGNOSIS — M5126 Other intervertebral disc displacement, lumbar region: Secondary | ICD-10-CM | POA: Insufficient documentation

## 2024-11-02 DIAGNOSIS — M791 Myalgia, unspecified site: Secondary | ICD-10-CM | POA: Insufficient documentation

## 2024-11-02 DIAGNOSIS — M542 Cervicalgia: Secondary | ICD-10-CM | POA: Insufficient documentation

## 2024-11-02 DIAGNOSIS — M47812 Spondylosis without myelopathy or radiculopathy, cervical region: Secondary | ICD-10-CM | POA: Insufficient documentation

## 2024-11-02 DIAGNOSIS — M5416 Radiculopathy, lumbar region: Secondary | ICD-10-CM | POA: Insufficient documentation

## 2024-11-02 DIAGNOSIS — M51369 Other intervertebral disc degeneration, lumbar region without mention of lumbar back pain or lower extremity pain: Secondary | ICD-10-CM | POA: Insufficient documentation

## 2024-11-02 DIAGNOSIS — M5481 Occipital neuralgia: Secondary | ICD-10-CM | POA: Insufficient documentation

## 2024-11-03 ENCOUNTER — Ambulatory Visit (INDEPENDENT_AMBULATORY_CARE_PROVIDER_SITE_OTHER): Admitting: Orthopedic Surgery

## 2024-11-03 ENCOUNTER — Encounter: Payer: Self-pay | Admitting: Orthopedic Surgery

## 2024-11-03 VITALS — BP 119/71 | Ht 73.0 in | Wt 123.0 lb

## 2024-11-03 DIAGNOSIS — M19031 Primary osteoarthritis, right wrist: Secondary | ICD-10-CM | POA: Diagnosis not present

## 2024-11-03 DIAGNOSIS — H47323 Drusen of optic disc, bilateral: Secondary | ICD-10-CM | POA: Insufficient documentation

## 2024-11-03 NOTE — Progress Notes (Signed)
 "  EMERGENCY ROOM FOLLOW UP  NEW PROBLEM/PATIENT       Office Visit Note   Patient: Wayne Rowe           Date of Birth: 07/27/1961           MRN: [id number] Visit Date: 11/03/2024 Requested by: No referring provider defined for this encounter. PCP: Patient, No Pcp Per  Subjective:  Emergency room record from (date) December 30 has been reviewed and this is included by reference and includes the review of systems with the following addition:   Chief Complaint  Patient presents with   Wrist Pain    Right     HPI: 64 year old male was changing a tire something popped in his right wrist and has had pain ever since despite wearing a brace for the last couple of weeks  He says he did have some intermittent pain in the left wrist over the years from another injury but nothing like this.  He has fairly severe pain with any wrist motion              ROS: Denies numbness or tingling of the hand  Assessment & Plan:  Are there any outside images performed by another physician or qualified health professional not separately reported: Yes   Images personally read and my interpretation : X-ray images were taken at urgent care.  Media section has the pictures.  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 Arthritis of right wrist     Plan: I recommended he see a hand surgeon for wrist fusion.  He asked if that would make his wrist stay in 1 position and I responded yes he said he wants to think about it  In the meantime he will wear the wrist brace  I do not think it is of any value to give him more medication because he is already on oxycodone   Allergies[1] Current Outpatient Medications  Medication Instructions   carisoprodol  (SOMA ) 350 mg, Oral, 4 times daily   docusate sodium  (COLACE) 100 mg, Oral, 2 times daily   feeding supplement (ENSURE ENLIVE / ENSURE PLUS) LIQD 237 mLs, Oral, 3 times daily between meals   gabapentin (NEURONTIN) 300 mg, 3  times daily   lidocaine -prilocaine  (EMLA ) cream 1 Application, Topical, 4 times daily PRN   methocarbamol (ROBAXIN) 500 mg, 3 times daily   Multiple Vitamin (MULTIVITAMIN WITH MINERALS) TABS tablet 1 tablet, Oral, Daily   mupirocin  ointment (BACTROBAN ) 2 % 1 Application, Nasal, 2 times daily   Oxycodone  HCl 20 MG TABS 1 tablet, Every 6 hours PRN   Oxycodone  HCl 10 mg, Oral, Every  3 hours   pantoprazole  (PROTONIX ) 40 mg, Oral, Daily   polyethylene glycol (MIRALAX  / GLYCOLAX ) 17 g, Oral, Daily   predniSONE  (DELTASONE ) 40 mg, Oral, Daily   QUEtiapine  (SEROQUEL ) 50 mg, Oral, Daily at bedtime   tamsulosin  (FLOMAX ) 0.4 mg, Oral, Every evening   thiamine  (VITAMIN B-1) 100 mg, Oral, Daily   TRELEGY ELLIPTA 100-62.5-25 MCG/INH AEPB 1 puff, Daily   TRELEGY ELLIPTA 200-62.5-25 MCG/ACT AEPB 1 puff, Daily   VENTOLIN  HFA 108 (90 Base) MCG/ACT inhaler 1-2 puffs, Every 6 hours PRN     Review of Systems Review of Systems   has a past medical history of Anxiety, Arthritis, Asthma, Back pain, BPH (benign prostatic hyperplasia), COPD (chronic obstructive pulmonary disease) (HCC), DDD (degenerative disc disease), Emphysema of lung (HCC), and Pre-diabetes.   Past Surgical History:  Procedure Laterality  Date   BACK SURGERY     EYE SURGERY Left    reconstruction, hit with sledge hammer   INTERCOSTAL NERVE BLOCK Right 12/17/2023   Procedure: INTERCOSTAL NERVE BLOCK;  Surgeon: Shyrl Linnie KIDD, MD;  Location: [facility] OR;  Service: Thoracic;  Laterality: Right;    Family History  Problem Relation Age of Onset   Healthy Mother    Hypertension Father    Diabetes Father    Hyperlipidemia Father    Arthritis Sister    Heart attack Maternal Grandfather    Cancer Sister     Social History Social History[2]  Allergies[3]  Current Outpatient Medications  Medication Sig Dispense Refill   gabapentin (NEURONTIN) 300 MG capsule Take 300 mg by mouth 3 (three) times daily.     methocarbamol (ROBAXIN) 500  MG tablet Take 500 mg by mouth 3 (three) times daily.     Oxycodone  HCl 20 MG TABS Take 1 tablet by mouth every 6 (six) hours as needed.     TRELEGY ELLIPTA 200-62.5-25 MCG/ACT AEPB Inhale 1 puff into the lungs daily.     carisoprodol  (SOMA ) 350 MG tablet Take 1 tablet (350 mg total) by mouth 4 (four) times daily. 20 tablet 0   docusate sodium  (COLACE) 100 MG capsule Take 1 capsule (100 mg total) by mouth 2 (two) times daily. 10 capsule 0   feeding supplement (ENSURE ENLIVE / ENSURE PLUS) LIQD Take 237 mLs by mouth 3 (three) times daily between meals. 237 mL 12   lidocaine -prilocaine  (EMLA ) cream Apply 1 Application topically 4 (four) times daily as needed (Pain). 30 g 0   Multiple Vitamin (MULTIVITAMIN WITH MINERALS) TABS tablet Take 1 tablet by mouth daily. 30 tablet 1   mupirocin  ointment (BACTROBAN ) 2 % Place 1 Application into the nose 2 (two) times daily. 22 g 0   Oxycodone  HCl 10 MG TABS Take 1 tablet (10 mg total) by mouth every 3 (three) hours. 8 tablet 0   pantoprazole  (PROTONIX ) 40 MG tablet Take 1 tablet (40 mg total) by mouth daily. 30 tablet 0   polyethylene glycol (MIRALAX  / GLYCOLAX ) 17 g packet Take 17 g by mouth daily. 14 each 0   predniSONE  (DELTASONE ) 10 MG tablet Take 4 tablets (40 mg total) by mouth daily. 20 tablet 0   QUEtiapine  (SEROQUEL ) 50 MG tablet Take 1 tablet (50 mg total) by mouth at bedtime. 30 tablet 0   tamsulosin  (FLOMAX ) 0.4 MG CAPS capsule Take 1 capsule (0.4 mg total) by mouth every evening. 30 capsule 0   thiamine  (VITAMIN B-1) 100 MG tablet Take 1 tablet (100 mg total) by mouth daily. 30 tablet 1   TRELEGY ELLIPTA 100-62.5-25 MCG/INH AEPB Inhale 1 puff into the lungs daily.     VENTOLIN  HFA 108 (90 Base) MCG/ACT inhaler Inhale 1-2 puffs into the lungs every 6 (six) hours as needed for wheezing or shortness of breath.      No current facility-administered medications for this visit.    Physical Exam BP 119/71 Comment: 10/18/24  Ht 6' 1 (1.854 m)   Wt  123 lb (55.8 kg)   BMI 16.23 kg/m  Body mass index is 16.23 kg/m.  Well developed and well nourished  Stands with normal weight bearing line  Alert and oriented x 3  Normal affect and mood  Wrist examination.  Right wrist reveals no surgical scar skin warm dry intact no erythema  Tenderness noted over the wrist joint not really over the [facility] joint  He has  a mild deformity of the wrist as well.  The range of motion is approximately total arc of 20 degrees  No effusion no swelling    [1] No Known Allergies [2]  Social History Tobacco Use   Smoking status: Every Day    Current packs/day: 1.00    Types: Cigarettes   Smokeless tobacco: Never  Vaping Use   Vaping status: Never Used  Substance Use Topics   Alcohol use: Not Currently   Drug use: Not Currently    Types: Marijuana    Comment: quit 2018  [3] No Known Allergies  "

## 2024-11-03 NOTE — Progress Notes (Signed)
" °  Intake history:  Chief Complaint  Patient presents with   Wrist Pain    Right      BP 110/80 Comment: 09/30/24  Ht 6' 1 (1.854 m)   Wt 123 lb (55.8 kg)   BMI 16.23 kg/m  Body mass index is 16.23 kg/m.  Pharmacy? [facility]___  WHAT ARE WE SEEING YOU FOR TODAY?   Right wrist injury changing a tire  How long has this bothered you? (DOI?DOS?WS?)  on 10/16/24  Was there an injury? Yes  Anticoag.  No   Any ALLERGIES _______Allergies[1] _______________________________________   Treatment:  Have you taken:  Tylenol  No  Advil No  Had PT No  Had injection No  Other  ____________wrist splint / had decadron  10 mg injection done at urgent care _____________        [1] No Known Allergies  "

## 2024-11-08 ENCOUNTER — Other Ambulatory Visit: Payer: Self-pay

## 2024-11-08 ENCOUNTER — Emergency Department (HOSPITAL_COMMUNITY)
Admission: EM | Admit: 2024-11-08 | Discharge: 2024-11-09 | Disposition: A | Discharge status: Home / Self Care | Attending: Emergency Medicine | Admitting: Emergency Medicine

## 2024-11-08 ENCOUNTER — Emergency Department (HOSPITAL_COMMUNITY)

## 2024-11-08 ENCOUNTER — Encounter (HOSPITAL_COMMUNITY): Payer: Self-pay

## 2024-11-08 DIAGNOSIS — R1084 Generalized abdominal pain: Secondary | ICD-10-CM | POA: Diagnosis present

## 2024-11-08 DIAGNOSIS — I861 Scrotal varices: Secondary | ICD-10-CM | POA: Diagnosis not present

## 2024-11-08 DIAGNOSIS — J449 Chronic obstructive pulmonary disease, unspecified: Secondary | ICD-10-CM | POA: Diagnosis not present

## 2024-11-08 LAB — CBC
HCT: 42.4 % (ref 39.0–52.0)
Hemoglobin: 13.9 g/dL (ref 13.0–17.0)
MCH: 30.1 pg (ref 26.0–34.0)
MCHC: 32.8 g/dL (ref 30.0–36.0)
MCV: 91.8 fL (ref 80.0–100.0)
Platelets: 426 K/uL — ABNORMAL HIGH (ref 150–400)
RBC: 4.62 MIL/uL (ref 4.22–5.81)
RDW: 12.3 % (ref 11.5–15.5)
WBC: 10.2 K/uL (ref 4.0–10.5)
nRBC: 0 % (ref 0.0–0.2)

## 2024-11-08 LAB — COMPREHENSIVE METABOLIC PANEL WITH GFR
ALT: 13 U/L (ref 0–44)
AST: 24 U/L (ref 15–41)
Albumin: 4.3 g/dL (ref 3.5–5.0)
Alkaline Phosphatase: 98 U/L (ref 38–126)
Anion gap: 14 (ref 5–15)
BUN: 15 mg/dL (ref 8–23)
CO2: 27 mmol/L (ref 22–32)
Calcium: 9.5 mg/dL (ref 8.9–10.3)
Chloride: 99 mmol/L (ref 98–111)
Creatinine, Ser: 1.18 mg/dL (ref 0.61–1.24)
GFR, Estimated: >60 mL/min
Glucose, Bld: 120 mg/dL — ABNORMAL HIGH (ref 70–99)
Potassium: 3.7 mmol/L (ref 3.5–5.1)
Sodium: 140 mmol/L (ref 135–145)
Total Bilirubin: 0.5 mg/dL (ref 0.0–1.2)
Total Protein: 7.1 g/dL (ref 6.5–8.1)

## 2024-11-08 LAB — LIPASE, BLOOD: Lipase: 13 U/L (ref 11–51)

## 2024-11-08 MED ORDER — MORPHINE SULFATE (PF) 4 MG/ML IV SOLN
INTRAVENOUS | Status: AC
  Filled 2024-11-08: qty 1

## 2024-11-08 MED ORDER — ONDANSETRON HCL 4 MG/2ML IJ SOLN
INTRAMUSCULAR | Status: AC
  Filled 2024-11-08: qty 2

## 2024-11-08 MED ORDER — MORPHINE SULFATE (PF) 4 MG/ML IV SOLN
4.0000 mg | Freq: Once | INTRAVENOUS | Status: AC
  Administered 2024-11-08: 4 mg via INTRAVENOUS

## 2024-11-08 MED ORDER — IOHEXOL 300 MG/ML  SOLN
100.0000 mL | Freq: Once | INTRAMUSCULAR | Status: AC | PRN
  Administered 2024-11-08: 100 mL via INTRAVENOUS

## 2024-11-08 MED ORDER — SODIUM CHLORIDE 0.9 % IV BOLUS
1000.0000 mL | Freq: Once | INTRAVENOUS | Status: AC
  Administered 2024-11-08: 1000 mL via INTRAVENOUS

## 2024-11-08 MED ORDER — HYDROMORPHONE HCL 1 MG/ML IJ SOLN
1.0000 mg | Freq: Once | INTRAMUSCULAR | Status: AC
  Administered 2024-11-08: 1 mg via INTRAVENOUS

## 2024-11-08 MED ORDER — ONDANSETRON HCL 4 MG/2ML IJ SOLN
4.0000 mg | Freq: Once | INTRAMUSCULAR | Status: AC
  Administered 2024-11-08: 4 mg via INTRAVENOUS

## 2024-11-08 MED ORDER — HYDROMORPHONE HCL 1 MG/ML IJ SOLN
INTRAMUSCULAR | Status: AC
  Filled 2024-11-08: qty 1

## 2024-11-08 NOTE — ED Notes (Signed)
 Patient transported to CT

## 2024-11-08 NOTE — ED Provider Notes (Signed)
 " [place] EMERGENCY DEPARTMENT AT [facility] Provider Note   CSN: [id number] Arrival date & time: 11/08/24  2105     Patient presents with: Abdominal Pain   Wayne Rowe is a 64 y.o. male.   Pt is a 64 yo male with pmhx significant for arthritis, ddd, copd, bph, and chronic pain.  Pt presents to the ED today with diffuse abd pain.  Sx started 30 minutes ago.  He is unable to get comfortable.  He is a poor historian.  No vomiting.  + nausea.  No fevers or chills.  No abd surgery.       Prior to Admission medications  Medication Sig Start Date End Date Taking? Authorizing Provider  carisoprodol  (SOMA ) 350 MG tablet Take 1 tablet (350 mg total) by mouth 4 (four) times daily. 09/30/24   Donnajean Lynwood DEL, PA-C  docusate sodium  (COLACE) 100 MG capsule Take 1 capsule (100 mg total) by mouth 2 (two) times daily. 12/20/23   Rosario Leatrice FERNS, MD  feeding supplement (ENSURE ENLIVE / ENSURE PLUS) LIQD Take 237 mLs by mouth 3 (three) times daily between meals. 12/20/23   Rosario Leatrice FERNS, MD  gabapentin (NEURONTIN) 300 MG capsule Take 300 mg by mouth 3 (three) times daily. 10/07/24   [provider]  lidocaine -prilocaine  (EMLA ) cream Apply 1 Application topically 4 (four) times daily as needed (Pain). 09/30/24   Donnajean Lynwood DEL, PA-C  methocarbamol (ROBAXIN) 500 MG tablet Take 500 mg by mouth 3 (three) times daily. 10/07/24   [provider]  Multiple Vitamin (MULTIVITAMIN WITH MINERALS) TABS tablet Take 1 tablet by mouth daily. 12/21/23   Rosario Leatrice FERNS, MD  mupirocin  ointment (BACTROBAN ) 2 % Place 1 Application into the nose 2 (two) times daily. 12/20/23   Rosario Leatrice FERNS, MD  Oxycodone  HCl 10 MG TABS Take 1 tablet (10 mg total) by mouth every 3 (three) hours. 09/30/24   Donnajean Lynwood DEL, PA-C  Oxycodone  HCl 20 MG TABS Take 1 tablet by mouth every 6 (six) hours as needed. 10/28/24   [provider]  pantoprazole  (PROTONIX ) 40 MG tablet Take 1  tablet (40 mg total) by mouth daily. 12/21/23   Rosario Leatrice I, MD  polyethylene glycol (MIRALAX  / GLYCOLAX ) 17 g packet Take 17 g by mouth daily. 12/21/23   Rosario Leatrice FERNS, MD  predniSONE  (DELTASONE ) 10 MG tablet Take 4 tablets (40 mg total) by mouth daily. 09/30/24   Donnajean Lynwood DEL, PA-C  QUEtiapine  (SEROQUEL ) 50 MG tablet Take 1 tablet (50 mg total) by mouth at bedtime. 12/20/23   Rosario Leatrice FERNS, MD  tamsulosin  (FLOMAX ) 0.4 MG CAPS capsule Take 1 capsule (0.4 mg total) by mouth every evening. 09/30/24   Donnajean Lynwood DEL, PA-C  thiamine  (VITAMIN B-1) 100 MG tablet Take 1 tablet (100 mg total) by mouth daily. 12/21/23   Rosario Leatrice FERNS, MD  TRELEGY ELLIPTA 100-62.5-25 MCG/INH AEPB Inhale 1 puff into the lungs daily. 09/16/19   [provider]  DOMINIC BECK 200-62.5-25 MCG/ACT AEPB Inhale 1 puff into the lungs daily. 10/08/24   [provider]  VENTOLIN  HFA 108 (90 Base) MCG/ACT inhaler Inhale 1-2 puffs into the lungs every 6 (six) hours as needed for wheezing or shortness of breath.  03/08/19   [provider]    Allergies: Patient has no known allergies.    Review of Systems  Gastrointestinal:  Positive for abdominal pain and nausea.  All other systems reviewed and are negative.  Updated Vital Signs BP 110/84   Pulse 74   Temp 98.4 F (36.9 C) (Oral)   Resp (!) 22   SpO2 97%   Physical Exam Vitals and nursing note reviewed.  Constitutional:      Appearance: He is underweight.  HENT:     Head: Normocephalic and atraumatic.     Mouth/Throat:     Mouth: Mucous membranes are moist.     Pharynx: Oropharynx is clear.  Eyes:     Extraocular Movements: Extraocular movements intact.     Pupils: Pupils are equal, round, and reactive to light.  Cardiovascular:     Rate and Rhythm: Normal rate and regular rhythm.     Heart sounds: Normal heart sounds.  Pulmonary:     Effort: Pulmonary effort is normal.     Breath sounds: Normal breath  sounds.  Abdominal:     General: Abdomen is flat. Bowel sounds are normal.     Palpations: Abdomen is soft.     Tenderness: There is generalized abdominal tenderness.  Genitourinary:    Comments: Suspected varicocele to left testicle.  No testicular pain. Skin:    General: Skin is warm.     Capillary Refill: Capillary refill takes less than 2 seconds.  Neurological:     General: No focal deficit present.     Mental Status: He is alert and oriented to person, place, and time.  Psychiatric:        Mood and Affect: Mood normal.        Behavior: Behavior normal.     (all labs ordered are listed, but only abnormal results are displayed) Labs Reviewed  COMPREHENSIVE METABOLIC PANEL WITH GFR - Abnormal; Notable for the following components:      Result Value   Glucose, Bld 120 (*)    All other components within normal limits  CBC - Abnormal; Notable for the following components:   Platelets 426 (*)    All other components within normal limits  LIPASE, BLOOD  URINALYSIS, ROUTINE W REFLEX MICROSCOPIC    EKG: None  Radiology: CT ABDOMEN PELVIS W CONTRAST Result Date: 11/08/2024 EXAM: CT ABDOMEN AND PELVIS WITH CONTRAST 11/08/2024 10:51:19 PM TECHNIQUE: CT of the abdomen and pelvis was performed with the administration of intravenous contrast. Multiplanar reformatted images are provided for review. Automated exposure control, iterative reconstruction, and/or weight-based adjustment of the mA/kV was utilized to reduce the radiation dose to as low as reasonably achievable. COMPARISON: 09/22/2019 CLINICAL HISTORY: Abdominal pain, acute, nonlocalized FINDINGS: LOWER CHEST: Pectus deformity. LIVER: The liver is unremarkable. GALLBLADDER AND BILE DUCTS: Gallbladder is unremarkable. No biliary ductal dilatation. SPLEEN: No acute abnormality. PANCREAS: No acute abnormality. ADRENAL GLANDS: No acute abnormality. KIDNEYS, URETERS AND BLADDER: Subcentimeter left upper pole renal cyst, benign (Bosniak  1). No stones in the kidneys or ureters. No hydronephrosis. No perinephric or periureteral stranding. Urinary bladder is unremarkable. GI AND BOWEL: Stomach demonstrates no acute abnormality. There is no bowel obstruction. PERITONEUM AND RETROPERITONEUM: Trace pelvic fluid/ascites, nonspecific. No free air. VASCULATURE: Atherosclerotic calcifications of the abdominal aorta and bilateral iliacs, although patent. LYMPH NODES: No lymphadenopathy. REPRODUCTIVE ORGANS: The prostate is unremarkable. BONES AND SOFT TISSUES: Degenerative changes of the visualized thoracolumbar spine. No acute osseous abnormality. No focal soft tissue abnormality. IMPRESSION: 1. No acute findings in the abdomen or pelvis. 2. Aortic Atherosclerosis (ICD10-I70.0). Electronically signed by: Pinkie Pebbles MD 11/08/2024 10:57 PM EST RP Workstation: [id number]     Procedures   Medications Ordered in the ED  sodium  chloride 0.9 % bolus 1,000 mL (1,000 mLs Intravenous New Bag/Given 11/08/24 2154)  morphine  (PF) 4 MG/ML injection 4 mg (4 mg Intravenous Given 11/08/24 2155)  ondansetron  (ZOFRAN ) injection 4 mg (4 mg Intravenous Given 11/08/24 2155)  HYDROmorphone  (DILAUDID ) injection 1 mg (1 mg Intravenous Given 11/08/24 2240)  iohexol  (OMNIPAQUE ) 300 MG/ML solution 100 mL (100 mLs Intravenous Contrast Given 11/08/24 2243)                                    Medical Decision Making Amount and/or Complexity of Data Reviewed Labs: ordered. Radiology: ordered.  Risk Prescription drug management.   This patient presents to the ED for concern of abd pain, this involves an extensive number of treatment options, and is a complaint that carries with it a high risk of complications and morbidity.  The differential diagnosis includes appy, diverticulitis, perf viscous, infection, constipation   Co morbidities that complicate the patient evaluation  arthritis, ddd, copd, bph, and chronic pain   Additional history  obtained:  Additional history obtained from epic chart review External records from outside source obtained and reviewed including family   Lab Tests:  I Ordered, and personally interpreted labs.  The pertinent results include:  cbc nl, cmp nl   Imaging Studies ordered:  I ordered imaging studies including ct abd/pelvis  I independently visualized and interpreted imaging which showed  . No acute findings in the abdomen or pelvis.  2. Aortic Atherosclerosis (ICD10-I70.0).   I agree with the radiologist interpretation   Medicines ordered and prescription drug management:  I ordered medication including ivfs/zofran /morphine   for sx  Reevaluation of the patient after these medicines showed that the patient improved I have reviewed the patients home medicines and have made adjustments as needed   Test Considered:  ct   Critical Interventions:  Pain control  Problem List / ED Course:  Abd pain:  improved after meds.  Ct neg.  UA pending at shift change.  Pt will be able to go home.  He is to return if worse.    Reevaluation:  After the interventions noted above, I reevaluated the patient and found that they have :improved   Social Determinants of Health:  Lives at home   Dispostion:  After consideration of the diagnostic results and the patients response to treatment, I feel that the patent would benefit from discharge with outpatient f/u.       Final diagnoses:  Generalized abdominal pain  Left varicocele    ED Discharge Orders     None          Dean Clarity, MD 11/08/24 2335  "

## 2024-11-08 NOTE — ED Triage Notes (Signed)
 Pt POV c/o abdominal pain that feels like a stabbing pain that started x30 mins ago. Pt not able to sit still and laying on the floor.

## 2024-11-09 LAB — URINALYSIS, ROUTINE W REFLEX MICROSCOPIC
Bilirubin Urine: NEGATIVE
Glucose, UA: NEGATIVE mg/dL
Hgb urine dipstick: NEGATIVE
Ketones, ur: NEGATIVE mg/dL
Leukocytes,Ua: NEGATIVE
Nitrite: NEGATIVE
Protein, ur: NEGATIVE mg/dL
Specific Gravity, Urine: 1.038 — ABNORMAL HIGH (ref 1.005–1.030)
pH: 6 (ref 5.0–8.0)
